# Patient Record
Sex: Female | Born: 1935 | Race: White | Hispanic: No | Smoking: Never smoker
Health system: Southern US, Community
[De-identification: ages and names within clinical notes are randomized; demographics above are authoritative.]

## PROBLEM LIST (undated history)

## (undated) DIAGNOSIS — Z8719 Personal history of other diseases of the digestive system: Secondary | ICD-10-CM

## (undated) DIAGNOSIS — I5033 Acute on chronic diastolic (congestive) heart failure: Secondary | ICD-10-CM

## (undated) DIAGNOSIS — K219 Gastro-esophageal reflux disease without esophagitis: Secondary | ICD-10-CM

## (undated) DIAGNOSIS — J449 Chronic obstructive pulmonary disease, unspecified: Secondary | ICD-10-CM

## (undated) DIAGNOSIS — N2581 Secondary hyperparathyroidism of renal origin: Secondary | ICD-10-CM

## (undated) DIAGNOSIS — I119 Hypertensive heart disease without heart failure: Secondary | ICD-10-CM

## (undated) DIAGNOSIS — I872 Venous insufficiency (chronic) (peripheral): Secondary | ICD-10-CM

## (undated) DIAGNOSIS — I340 Nonrheumatic mitral (valve) insufficiency: Secondary | ICD-10-CM

## (undated) DIAGNOSIS — J189 Pneumonia, unspecified organism: Secondary | ICD-10-CM

## (undated) DIAGNOSIS — I4891 Unspecified atrial fibrillation: Secondary | ICD-10-CM

## (undated) DIAGNOSIS — G43909 Migraine, unspecified, not intractable, without status migrainosus: Secondary | ICD-10-CM

## (undated) DIAGNOSIS — N183 Chronic kidney disease, stage 3 (moderate): Secondary | ICD-10-CM

## (undated) DIAGNOSIS — R0602 Shortness of breath: Secondary | ICD-10-CM

## (undated) DIAGNOSIS — I10 Essential (primary) hypertension: Secondary | ICD-10-CM

## (undated) DIAGNOSIS — I272 Pulmonary hypertension, unspecified: Secondary | ICD-10-CM

## (undated) DIAGNOSIS — I639 Cerebral infarction, unspecified: Secondary | ICD-10-CM

## (undated) DIAGNOSIS — M1990 Unspecified osteoarthritis, unspecified site: Secondary | ICD-10-CM

## (undated) DIAGNOSIS — E785 Hyperlipidemia, unspecified: Secondary | ICD-10-CM

## (undated) DIAGNOSIS — Z86711 Personal history of pulmonary embolism: Secondary | ICD-10-CM

## (undated) DIAGNOSIS — I5032 Chronic diastolic (congestive) heart failure: Secondary | ICD-10-CM

## (undated) HISTORY — PX: PARTIAL COLECTOMY: SHX5273

## (undated) HISTORY — DX: Chronic diastolic (congestive) heart failure: I50.32

## (undated) HISTORY — PX: CATARACT EXTRACTION W/ INTRAOCULAR LENS  IMPLANT, BILATERAL: SHX1307

## (undated) HISTORY — PX: INGUINAL HERNIA REPAIR: SUR1180

---

## 1997-07-26 HISTORY — PX: COLON RESECTION: SHX5231

## 1998-05-20 ENCOUNTER — Inpatient Hospital Stay (HOSPITAL_COMMUNITY): Admission: EM | Admit: 1998-05-20 | Discharge: 1998-05-26 | Payer: Self-pay | Admitting: *Deleted

## 1998-11-25 ENCOUNTER — Ambulatory Visit (HOSPITAL_COMMUNITY): Admission: RE | Admit: 1998-11-25 | Discharge: 1998-11-25 | Payer: Self-pay | Admitting: Family Medicine

## 1998-11-25 ENCOUNTER — Encounter: Payer: Self-pay | Admitting: Family Medicine

## 1999-01-28 ENCOUNTER — Ambulatory Visit (HOSPITAL_COMMUNITY): Admission: RE | Admit: 1999-01-28 | Discharge: 1999-01-28 | Payer: Self-pay | Admitting: Family Medicine

## 1999-01-28 ENCOUNTER — Encounter: Payer: Self-pay | Admitting: Family Medicine

## 1999-03-24 ENCOUNTER — Other Ambulatory Visit: Admission: RE | Admit: 1999-03-24 | Discharge: 1999-03-24 | Payer: Self-pay | Admitting: Family Medicine

## 1999-05-22 ENCOUNTER — Ambulatory Visit (HOSPITAL_COMMUNITY): Admission: RE | Admit: 1999-05-22 | Discharge: 1999-05-22 | Payer: Self-pay | Admitting: *Deleted

## 1999-07-24 ENCOUNTER — Encounter: Payer: Self-pay | Admitting: Emergency Medicine

## 1999-07-24 ENCOUNTER — Emergency Department (HOSPITAL_COMMUNITY): Admission: EM | Admit: 1999-07-24 | Discharge: 1999-07-24 | Payer: Self-pay | Admitting: Emergency Medicine

## 1999-10-06 ENCOUNTER — Ambulatory Visit (HOSPITAL_COMMUNITY): Admission: RE | Admit: 1999-10-06 | Discharge: 1999-10-06 | Payer: Self-pay | Admitting: Family Medicine

## 1999-10-06 ENCOUNTER — Encounter: Payer: Self-pay | Admitting: Family Medicine

## 2000-02-08 ENCOUNTER — Encounter: Admission: RE | Admit: 2000-02-08 | Discharge: 2000-02-25 | Payer: Self-pay | Admitting: Orthopedic Surgery

## 2001-03-23 ENCOUNTER — Encounter: Admission: RE | Admit: 2001-03-23 | Discharge: 2001-03-23 | Payer: Self-pay | Admitting: Specialist

## 2001-03-23 ENCOUNTER — Encounter: Payer: Self-pay | Admitting: Specialist

## 2002-07-26 HISTORY — PX: TOTAL HIP ARTHROPLASTY: SHX124

## 2002-10-16 ENCOUNTER — Encounter: Payer: Self-pay | Admitting: Specialist

## 2002-10-19 ENCOUNTER — Encounter: Payer: Self-pay | Admitting: Specialist

## 2002-10-19 ENCOUNTER — Inpatient Hospital Stay (HOSPITAL_COMMUNITY): Admission: RE | Admit: 2002-10-19 | Discharge: 2002-10-23 | Payer: Self-pay | Admitting: Specialist

## 2002-10-23 ENCOUNTER — Inpatient Hospital Stay (HOSPITAL_COMMUNITY)
Admission: RE | Admit: 2002-10-23 | Discharge: 2002-10-31 | Payer: Self-pay | Admitting: Physical Medicine & Rehabilitation

## 2002-12-02 ENCOUNTER — Encounter: Payer: Self-pay | Admitting: Emergency Medicine

## 2002-12-02 ENCOUNTER — Encounter: Payer: Self-pay | Admitting: Specialist

## 2002-12-02 ENCOUNTER — Inpatient Hospital Stay (HOSPITAL_COMMUNITY): Admission: EM | Admit: 2002-12-02 | Discharge: 2002-12-03 | Payer: Self-pay | Admitting: Emergency Medicine

## 2003-07-27 HISTORY — PX: TOTAL HIP ARTHROPLASTY: SHX124

## 2003-08-30 ENCOUNTER — Inpatient Hospital Stay (HOSPITAL_COMMUNITY): Admission: RE | Admit: 2003-08-30 | Discharge: 2003-09-03 | Payer: Self-pay | Admitting: Orthopedic Surgery

## 2003-09-03 ENCOUNTER — Inpatient Hospital Stay (HOSPITAL_COMMUNITY)
Admission: RE | Admit: 2003-09-03 | Discharge: 2003-09-17 | Payer: Self-pay | Admitting: Physical Medicine & Rehabilitation

## 2004-05-04 ENCOUNTER — Encounter: Admission: RE | Admit: 2004-05-04 | Discharge: 2004-08-02 | Payer: Self-pay | Admitting: Family Medicine

## 2005-06-21 ENCOUNTER — Encounter: Admission: RE | Admit: 2005-06-21 | Discharge: 2005-06-21 | Payer: Self-pay | Admitting: Orthopedic Surgery

## 2005-07-14 ENCOUNTER — Inpatient Hospital Stay (HOSPITAL_COMMUNITY): Admission: EM | Admit: 2005-07-14 | Discharge: 2005-07-20 | Payer: Self-pay | Admitting: Emergency Medicine

## 2005-07-16 ENCOUNTER — Encounter (INDEPENDENT_AMBULATORY_CARE_PROVIDER_SITE_OTHER): Payer: Self-pay | Admitting: *Deleted

## 2007-06-02 ENCOUNTER — Inpatient Hospital Stay (HOSPITAL_COMMUNITY): Admission: EM | Admit: 2007-06-02 | Discharge: 2007-06-05 | Payer: Self-pay | Admitting: Emergency Medicine

## 2007-06-05 ENCOUNTER — Encounter (INDEPENDENT_AMBULATORY_CARE_PROVIDER_SITE_OTHER): Payer: Self-pay | Admitting: Internal Medicine

## 2007-06-09 ENCOUNTER — Emergency Department (HOSPITAL_COMMUNITY): Admission: EM | Admit: 2007-06-09 | Discharge: 2007-06-09 | Payer: Self-pay | Admitting: Emergency Medicine

## 2008-07-19 ENCOUNTER — Ambulatory Visit: Payer: Self-pay | Admitting: Radiology

## 2008-07-19 ENCOUNTER — Encounter: Payer: Self-pay | Admitting: Emergency Medicine

## 2008-07-20 ENCOUNTER — Ambulatory Visit: Payer: Self-pay | Admitting: Internal Medicine

## 2008-07-20 ENCOUNTER — Inpatient Hospital Stay (HOSPITAL_COMMUNITY): Admission: EM | Admit: 2008-07-20 | Discharge: 2008-07-22 | Payer: Self-pay | Admitting: Internal Medicine

## 2008-07-23 ENCOUNTER — Inpatient Hospital Stay (HOSPITAL_COMMUNITY): Admission: EM | Admit: 2008-07-23 | Discharge: 2008-07-25 | Payer: Self-pay | Admitting: Cardiology

## 2008-07-23 ENCOUNTER — Ambulatory Visit: Payer: Self-pay | Admitting: Cardiology

## 2008-07-23 ENCOUNTER — Encounter: Payer: Self-pay | Admitting: Emergency Medicine

## 2008-07-23 ENCOUNTER — Ambulatory Visit: Payer: Self-pay | Admitting: Diagnostic Radiology

## 2008-07-25 ENCOUNTER — Encounter (INDEPENDENT_AMBULATORY_CARE_PROVIDER_SITE_OTHER): Payer: Self-pay | Admitting: Cardiology

## 2008-07-31 ENCOUNTER — Ambulatory Visit: Payer: Self-pay | Admitting: Internal Medicine

## 2008-07-31 DIAGNOSIS — I6350 Cerebral infarction due to unspecified occlusion or stenosis of unspecified cerebral artery: Secondary | ICD-10-CM | POA: Insufficient documentation

## 2008-07-31 DIAGNOSIS — R0989 Other specified symptoms and signs involving the circulatory and respiratory systems: Secondary | ICD-10-CM | POA: Insufficient documentation

## 2008-07-31 DIAGNOSIS — R0609 Other forms of dyspnea: Secondary | ICD-10-CM

## 2008-11-08 ENCOUNTER — Ambulatory Visit: Payer: Self-pay | Admitting: Internal Medicine

## 2009-08-11 ENCOUNTER — Inpatient Hospital Stay (HOSPITAL_COMMUNITY): Admission: AD | Admit: 2009-08-11 | Discharge: 2009-08-14 | Payer: Self-pay | Admitting: Cardiology

## 2009-08-12 ENCOUNTER — Encounter (INDEPENDENT_AMBULATORY_CARE_PROVIDER_SITE_OTHER): Payer: Self-pay | Admitting: Cardiology

## 2009-08-20 ENCOUNTER — Telehealth: Payer: Self-pay | Admitting: Internal Medicine

## 2010-08-25 NOTE — Progress Notes (Signed)
Summary: returning call  Phone Note Call from Patient Call back at Home Phone [phone]   Caller: Patient Call For: wert Reason for Call: Talk to Doctor Summary of Call: patient is returning a call. she thinks she needs an apt but she don't know why she would need to see dr wert since he released her. Initial call taken by: Valinda Hoar,  August 20, 2009 12:44 PM  Follow-up for Phone Call        pt was called to advise that she is supposed to get her refills from her PCP. Pt states she will call PCP for refill. Carron Curie CMA  August 20, 2009 2:11 PM

## 2010-10-11 LAB — DIFFERENTIAL
Basophils Absolute: 0 10*3/uL (ref 0.0–0.1)
Basophils Relative: 0 % (ref 0–1)
Eosinophils Absolute: 0.4 10*3/uL (ref 0.0–0.7)
Eosinophils Relative: 5 % (ref 0–5)
Lymphocytes Relative: 29 % (ref 12–46)
Lymphs Abs: 2.1 10*3/uL (ref 0.7–4.0)
Monocytes Absolute: 0.7 10*3/uL (ref 0.1–1.0)
Monocytes Relative: 10 % (ref 3–12)
Neutro Abs: 4.1 10*3/uL (ref 1.7–7.7)
Neutrophils Relative %: 56 % (ref 43–77)

## 2010-10-11 LAB — COMPREHENSIVE METABOLIC PANEL
ALT: 17 U/L (ref 0–35)
AST: 22 U/L (ref 0–37)
Albumin: 3.8 g/dL (ref 3.5–5.2)
Alkaline Phosphatase: 65 U/L (ref 39–117)
BUN: 43 mg/dL — ABNORMAL HIGH (ref 6–23)
CO2: 30 mEq/L (ref 19–32)
Calcium: 9.3 mg/dL (ref 8.4–10.5)
Chloride: 99 mEq/L (ref 96–112)
Creatinine, Ser: 1.61 mg/dL — ABNORMAL HIGH (ref 0.4–1.2)
GFR calc Af Amer: 37 mL/min — ABNORMAL LOW (ref >60)
GFR calc non Af Amer: 30 mL/min — ABNORMAL LOW (ref >60)
Glucose, Bld: 155 mg/dL — ABNORMAL HIGH (ref 70–99)
Potassium: 4.2 mEq/L (ref 3.5–5.1)
Sodium: 141 mEq/L (ref 135–145)
Total Bilirubin: 0.5 mg/dL (ref 0.3–1.2)
Total Protein: 6.9 g/dL (ref 6.0–8.3)

## 2010-10-11 LAB — CBC
HCT: 37.1 % (ref 36.0–46.0)
Hemoglobin: 12.7 g/dL (ref 12.0–15.0)
MCHC: 34.1 g/dL (ref 30.0–36.0)
MCV: 93.8 fL (ref 78.0–100.0)
Platelets: 242 10*3/uL (ref 150–400)
RBC: 3.96 MIL/uL (ref 3.87–5.11)
RDW: 13.5 % (ref 11.5–15.5)
WBC: 7.3 10*3/uL (ref 4.0–10.5)

## 2010-10-11 LAB — PROTIME-INR
INR: 1.86 — ABNORMAL HIGH (ref 0.00–1.49)
INR: 2.07 — ABNORMAL HIGH (ref 0.00–1.49)
INR: 2.1 — ABNORMAL HIGH (ref 0.00–1.49)
Prothrombin Time: 21.3 seconds — ABNORMAL HIGH (ref 11.6–15.2)
Prothrombin Time: 23.4 seconds — ABNORMAL HIGH (ref 11.6–15.2)
Prothrombin Time: 26 seconds — ABNORMAL HIGH (ref 11.6–15.2)

## 2010-10-11 LAB — APTT: aPTT: 33 seconds (ref 24–37)

## 2010-10-11 LAB — CARDIAC PANEL(CRET KIN+CKTOT+MB+TROPI)
CK, MB: 1.7 ng/mL (ref 0.3–4.0)
Relative Index: INVALID (ref 0.0–2.5)
Total CK: 70 U/L (ref 7–177)
Troponin I: 0.03 ng/mL (ref 0.00–0.06)

## 2010-10-11 LAB — TSH: TSH: 1.273 u[IU]/mL (ref 0.350–4.500)

## 2010-12-08 NOTE — H&P (Signed)
[name], [name] NO.:  [id number]   MEDICAL RECORD NO.:  [id number]          PATIENT TYPE:  INP   LOCATION:  1427                         FACILITY:  [facility]   PHYSICIAN:  Gardiner Barefoot, MD    DATE OF BIRTH:  1922/04/18   DATE OF ADMISSION:  07/20/2008  DATE OF DISCHARGE:                              HISTORY & PHYSICAL   PRIMARY CARE PHYSICIAN:  Carilyn Goodpasture, PA, of [facility].   HISTORY OF PRESENT ILLNESS:  An 77 year old female with history of  hypertension and history of a PE, who presented with chest pain for a  day, pleuritic.  The chest pain resolved on its own; however, the  patient during her ER evaluation felt some angina-type symptoms.  No  recent illnesses or fever.   PAST MEDICAL HISTORY:  1. Hypertension.  2. History of PE.  3. History of atrial fibrillation.   MEDICATIONS:  Include:  1. Aspirin 81 mg daily.  2. Doxazosin 8 mg daily.  3. Evista 60 mg daily.  4. Avapro 300 mg daily.  5. Bumetadine 2 mg daily.  6. Omeprazole 20 mg daily.   ALLERGIES:  1. CLONIDINE.  2. LISINOPRIL.   FAMILY HISTORY:  Noncontributory.   SOCIAL HISTORY:  Denies any alcohol, tobacco, or drugs.   REVIEW OF SYSTEMS:  Negative except as per the History of Present  Illness.   PHYSICAL EXAMINATION:  VITAL SIGNS:  Temperature 99.0, pulse 89,  respirations 20, blood pressure is 184/100, and 02 SAT is 95% on 2 L.  GENERAL:  The patient is awake, alert, and oriented x3, appears in no  acute distress.  CARDIOVASCULAR:  Irregular rate and rhythm with a systolic ejection  murmur.  LUNGS:  Clear to auscultation bilaterally.  ABDOMEN:  Soft, nontender, and nondistended, positive bowel sounds, no  hepatosplenomegaly.  EXTREMITIES:  No edema.   LABORATORY DATA:  Includes a CT angio negative for PE.  CK-MB is 1.5,  troponin less than 0.05.  Sodium 144, potassium 3.9, chloride 101,  bicarb 34, glucose 231, BUN 33, creatinine is 1.3.  WBC is 11.6,  hemoglobin of 12.9, platelets 190.   ASSESSMENT AND PLAN:  1. Chest pain.  Will rule out for myocardial infarction.  2. Hypertension.  Will continue with home medications.  3. Gastroesophageal reflux disease.  Will continue with home      medications.  4. Prophylaxis.  Will provide deep venous thrombosis prophylaxis.      Gardiner Barefoot, MD  Electronically Signed     RWC/MEDQ  D:  07/20/2008  T:  07/20/2008  Job:  [id number]   cc:   Carilyn Goodpasture, PA  [facility]

## 2010-12-08 NOTE — Discharge Summary (Signed)
NAMECHRISTAIN, [name]          ACCOUNT NO.:  [id number]   MEDICAL RECORD NO.:  [id number]          PATIENT TYPE:  INP   LOCATION:  4729                         FACILITY:  [facility]   PHYSICIAN:  [name], M.D.DATE OF BIRTH:  September 17, 1921   DATE OF ADMISSION:  07/23/2008  DATE OF DISCHARGE:  07/25/2008                               DISCHARGE SUMMARY   FINAL DIAGNOSES:  1. Paroxysmal atrial fibrillation, resolved.  2. Hypertensive heart disease with diastolic dysfunction.  3. Esophageal reflux.  4. Diet-controlled diabetes.   PROCEDURES:  Echocardiogram.   HISTORY:  The patient is an 77 year old female with a history of  paroxysmal atrial fibrillation, hypertension, and diastolic heart  failure.  She had recently been admitted to [facility] on  July 20, 2008, with pleuritic right-sided chest pain.  She was  discharged the day prior to admission and was readmitted that day.  She  had a negative CT scan of her chest.  The evening prior to admission,  she felt severe substernal pain, not like the pleuritic chest pain,  rapid heartbeat, and was found to be in rapid atrial fibrillation.  She  presented to the emergency room and was placed on a diltiazem drip and  went back into sinus rhythm following that.  Please see the previously  dictated history and physical for remainder of the details.   HOSPITAL COURSE:  The patient converted to sinus rhythm on diltiazem.  She was placed initially on heparin and cardiac enzymes were negative.  She was transitioned to oral diltiazem and was begun on warfarin.  An  echocardiogram showed concentric LVH with normal systolic function.  There was evidence of moderate-to-severe pulmonary hypertension also  noted.  There was mild mitral regurgitation and mild aortic  regurgitation.  After reviewing her clinical course, it was felt that  she should be maintained on long-term Coumadin anticoagulation  especially with paroxysmal  atrial fibrillation as well as a previous  pulmonary embolus.  It was apparent that there had been some adjustments  in her medicines recently.  She had previously been on atenolol and had  been changed of Bystolic.  She was also due to see Dr. Cliffton Asters soon.  She  was stable and I felt she could be discharged and she was given Coumadin  anticoagulation and instruction.   DISCHARGE MEDICATIONS:  1. Warfarin 5 mg daily.  2. Doxazosin 4 mg daily.  3. Avapro 300 mg daily.  4. Bumetanide 2 mg daily.  5. Omeprazole 20 mg daily.  6. Sustained-release diltiazem 180 mg daily.   At the present time, she is to be off beta-blockers and is to be off  Evista as well as Naprosyn.  She may continue 81 mg of aspirin daily.  I  will see her back in followup on Monday for protime and she is to report  if there is recurrent symptoms or problems.      [name], M.D.  Electronically Signed     WST/MEDQ  D:  07/25/2008  T:  07/26/2008  Job:  [id number]   cc:   Stacie Acres. Cliffton Asters, M.D.

## 2010-12-08 NOTE — Discharge Summary (Signed)
[name], [name]          ACCOUNT NO.:  [id number]   MEDICAL RECORD NO.:  [id number]          PATIENT TYPE:  INP   LOCATION:  6730                         FACILITY:  [facility]   PHYSICIAN:  Kela Millin, M.D.DATE OF BIRTH:  March 12, 1922   DATE OF ADMISSION:  06/02/2007  DATE OF DISCHARGE:  06/05/2007                               DISCHARGE SUMMARY   DISCHARGE DIAGNOSES:  1. Hypertensive crisis/malignant hypertension.  2. Pulmonary edema, question pulmonary edema secondary to #1.  3. History of paroxysmal atrial fibrillation.  4. Diabetes mellitus, diet-controlled.  5. History of pulmonary embolism, off Coumadin for greater than 1 year      and CT scan of chest on June 02, 2007, negative.  6. History of pelvic fracture.   PROCEDURES AND STUDIES:  1. Negative for pulmonary embolism.  2. Slight progression of diffuse mediastinal and hilar adenopathy.  3. Diffuse mild interstitial edema pattern throughout the lungs with      small effusions suspicious for early congestive heart failure.  4. Two-dimensional echocardiogram done on June 05, 2007, pending      at the time of this dictation, the patient to follow up with Dr.      Donnie Aho and Dr. Cliffton Asters for results of the echocardiogram, ruled out      for myocardial infarction by enzymes.   HISTORY OF PRESENT ILLNESS:  The patient is an 77 year old, white female  with the above-listed medical problems who presented with complaints of  shortness of breath.  It was noted that she had a previous history of  pulmonary embolism and had been on Coumadin which had been discontinued  following treatment over a year ago.  She stated that her shortness of  breath had worsened over 2 weeks and admitted to mild increase in lower  extremity edema and PND.  She denied chest pain, orthopnea and also  denied fevers.  She admitted to a cough that was occasionally productive  of clear sputum.   In the ER, she had a CT angiogram of her chest  with the results as  above, negative for pulmonary embolism.  A B-type natriuretic peptide  was also done and this was elevated at 927.  It was noted that her blood  pressure initially was 209/93 in the ER.  She was admitted for further  evaluation and management.   Please see the full admission history and physical of June 02, 2007,  for the details of the admission physical exam and laboratory data.   HOSPITAL COURSE:  Problem 1.  HYPERTENSIVE CRISIS/MALIGNANT  HYPERTENSION:  In the ER, the patient received 20 mg of labetalol bolus.  Her blood pressures were still elevated and so she was started on  labetalol drip and admitted to the step-down unit.  Serial cardiac  enzymes were done and they were all negative for myocardial infarction.  In talking with the patient, she admitted to taking two Aleve daily for  arthritis.  I have instructed her not to take any further Aleve and to  take Tylenol p.r.n. or the Darvocet that she already has prescribed.  Her blood pressures have improved after getting  her restarted on her  outpatient medications and adding 1 mg p.o. dose of Bumex to what she  was taking already.  The patient's blood pressure in the past 48 hours  has ranged from 110-149/59-66.  She is asymptomatic.  She has been  ambulating and she will be discharged home at this time to follow up  with her primary care physician.   Problem 2.  PULMONARY EDEMA:  The patient was diuresed with IV Lasix  initially and her Bumex resumed.  As noted above, her B-type natriuretic  peptide was initially elevated and improved with diuresis to 546 today  at the time of discharge.  The patient's Bumex dose was changed from 2  mg daily to 2 mg in the morning and 1 mg in the p.m..  As already  discussed above, her Aleve has been discontinued.  A 2-D echocardiogram  was done today, June 05, 2007, and the results are pending at the  time of this dictation.  Her serial cardiac enzymes have been  negative  and she is asymptomatic.  She is to follow up with her primary care  physician and with Dr. Donnie Aho for the results of the 2-D echocardiogram.   Problem 3.  HISTORY OF PAROXYSMAL ATRIAL FIBRILLATION:  Her rate  remained controlled on the beta-blocker.  She is to continue this upon  discharge.   Problem 4.  DIABETES MELLITUS, DIET CONTROLLED:  She was maintained on a  diabetic diet during her hospital stay.  Her hemoglobin A1c was checked  and this was within normal limits at 5.6.   DISCHARGE MEDICATIONS:  1. Bumex 2 mg in a.m. and 1 mg in p.m.  2. The patient is to continue her Cardura, Avapro, lisinopril,      atenolol, Darvocet and Tylenol p.r.n.  3. Discontinue Aleve.   FOLLOWUP:  1. Dr. Laurann Montana.  The patient to call for appointment upon      discharge.  2. Dr. Donnie Aho in 1-2 weeks.  The patient to call for appointment.   DISCHARGE CONDITION:  Improved/stable.      Kela Millin, M.D.  Electronically Signed     ACV/MEDQ  D:  06/05/2007  T:  06/06/2007  Job:  [id number]   cc:   Stacie Acres. Cliffton Asters, M.D.  [name], M.D.

## 2010-12-08 NOTE — H&P (Signed)
NAMEMARVELYN, BOUCHILLON NO.:  [id number]   MEDICAL RECORD NO.:  [id number]          PATIENT TYPE:  INP   LOCATION:                               FACILITY:  [facility]   PHYSICIAN:  Marca Ancona, MD      DATE OF BIRTH:  1921/08/27   DATE OF ADMISSION:  07/23/2008  DATE OF DISCHARGE:                              HISTORY & PHYSICAL   PRIMARY CARDIOLOGIST:  [name], MD   PRIMARY CARE PHYSICIAN:  Dr. Carilyn Goodpasture with [facility].   HISTORY OF PRESENT ILLNESS:  This is an 77 year old with a history of  paroxysmal atrial fibrillation, hypertension, and diastolic congestive  heart failure who was admitted this evening with atrial fibrillation  with rapid ventricular response.  The patient was initially admitted to  [facility] on July 20, 2008, with pleuritic right-sided chest pain.  She was discharged on July 22, 2008.  CTA of the chest was negative  for PE and the patient ruled out for MI.  No discharge summary was  available, but the patient states she was told she had musculoskeletal  chest pain and was sent home.  Last night at 10:15 p.m., the patient  felt severe substernal chest pressure that was not like the prior  pleuritic chest pain.  She took aspirin 325 mg.  The pain resolved in  approximately half an hour.  At the same time as the pain started, she  also thought her heart rate started to race and to go irregularly.  She  came to the emergency department and was found to be in atrial  fibrillation with rapid ventricular response with a rate in the 140s.  She was begun on diltiazem drip.  This was titrated up to 15 mg an hour  with control of her heart rate down to the 90s to 100s.  Her rhythm then  actually converted to normal sinus rhythm in the 60s early this morning.  The patient additionally reports a productive cough for 3 weeks.  She  was told a couple of weeks ago that this was bronchitis.  She has had no  fever.  She does have  baseline dyspnea on exertion after walking for  approximately 1 block.  She did feel at her baseline yesterday afternoon  when she was discharged from [facility].   PAST MEDICAL HISTORY:  1. Hypertension.  2. History of pulmonary embolism in 2007 after a pelvic fracture.  The      patient was on Coumadin for over a year.  3. Gastroesophageal reflux disease.  4. Paroxysmal atrial fibrillation.  5. Diet-controlled diabetes.  6. Diastolic congestive heart failure.  The patient did have an      echocardiogram in November 2008 showing an EF of 60-65% with mild-      to-moderate LVH.  There was mitral valve prolapse with moderate      mitral regurgitation.  There was moderate-to-severe tricuspid      regurgitation with pulmonary artery systolic pressure of 60-70      mmHg.   SOCIAL HISTORY:  The patient is nonsmoker.  She lives in [place]  with  her daughter.   FAMILY HISTORY:  Noncontributory.   MEDICATIONS:  1. Aspirin 81 mg daily.  2. Doxazosin 8 mg daily.  3. Irbesartan 300 mg daily.  4. Bumetanide 2 mg daily.  5. Omeprazole 20 mg daily.   LABORATORY DATA:  Today, sodium 142, potassium 4.0, BUN 22, creatinine  1.2, and glucose 141.  White count 7, hematocrit 36, and platelets 161.  Initial CK-MB 1.9, initial troponin less than 0.05.  Chest x-ray was  reviewed, it shows bibasilar airspace disease, right greater than left.  EKG was reviewed, shows atrial fibrillation with a rapid ventricular  response in the 140s.   PHYSICAL EXAMINATION:  VITAL SIGNS:  Blood pressure is 142/83; heart  rate was initially in the 140s, is now down to the 60s in sinus rhythm;  temperature is 97.6; and O2 saturation 97% on 2 L of nasal cannula.  GENERAL:  This is a mildly obese female in no apparent distress.  NEUROLOGIC:  Alert and oriented x3.  Normal affect.  LUNGS:  Rare crackles and decreased breath sounds at the bases  bilaterally, worse at the right base.  CARDIOVASCULAR:  Heart regular.   S1 and S2.  There is a 1/6 whole  systolic murmur at left lower sternal border.  There is no S3 or S4.  There is 1+ ankle edema.  There is no carotid bruits.  There are 2+  posterior tibial pulses bilaterally.  NECK:  The external jugular veins are significantly dilated.  It is  quite hard to see her internal jugular pulsations.  There is no  thyromegaly or thyroid nodule.  HEENT:  Normal exam.  ABDOMEN:  Soft, nontender.  Normal bowel sounds.  No hepatosplenomegaly.  MUSCULOSKELETAL:  Normal.  SKIN:  Normal.  EXTREMITIES:  No clubbing or cyanosis.   ASSESSMENT/PLAN:  This is an 77 year old with history of paroxysmal  atrial fibrillation, hypertension, diastolic congestive heart failure  who presents with an episode of chest pain in the setting of the  development of atrial fibrillation with a rapid ventricular response.  1. Atrial fibrillation.  The patient converted to normal sinus rhythm      this a.m. on diltiazem.  She has a history of paroxysmal atrial      fibrillation.  She is currently on a diltiazem drip.  She just      converted over to sinus rhythm.  If she remains in sinus rhythm, we      can covert her over to p.o. diltiazem.  We will also have her on a      heparin drip for now, given that her atrial fibrillation with      conversion to sinus rhythm.  We will start Warfarin.  We will also      check a TSH.  2. Chest pain.  This may be the main ischemia related to atrial      fibrillation with rapid ventricular response.  Her first set of      cardiac enzymes are negative.  We will cycle her cardiac enzymes.      We will check fasting lipids.  We will continue her on aspirin 81      mg daily.  If her cardiac enzymes continue to be negative, we will      consider a stress test, this could be done possibly as an      outpatient.  3. Cough and airspace disease on chest x-ray.  I would consider  possible pneumonia in this patient.  She is afebrile and her white       count is now elevated; however, given her productive cough and her      chest x-ray infiltrates, I will treat her with moxifloxacin for      now.  4. Congestive heart failure.  The patient does have a history of      diastolic congestive heart failure.  She does appear mildly volume      overloaded.  We will continue her on her bumetanide and we will      check a BNP.      Marca Ancona, MD  Electronically Signed    DM/MEDQ  D:  07/23/2008  T:  07/23/2008  Job:  [id number]   cc:   [name], M.D.

## 2010-12-08 NOTE — Discharge Summary (Signed)
[name], [name]          ACCOUNT NO.:  [id number]   MEDICAL RECORD NO.:  [id number]          PATIENT TYPE:  INP   LOCATION:  2919                         FACILITY:  [facility]   PHYSICIAN:  Kela Millin, M.D.DATE OF BIRTH:  12/15/1921   DATE OF ADMISSION:  06/02/2007  DATE OF DISCHARGE:                               DISCHARGE SUMMARY   PRIMARY CARE PHYSICIAN:  Dr. Laurann Montana.   CARDIOLOGIST:  [name], M.D.   CHIEF COMPLAINT:  Shortness of breath.   HISTORY OF PRESENT ILLNESS:  The patient is a pleasant 77 year old white  female with past medical history significant for pulmonary embolism  treated with Coumadin for a year and was discontinued a little over the  year ago per family, paroxysmal atrial fibrillation per E-chart,  diabetes mellitus that is diet controlled, hypertension who presents  with the above complaints.  She states that she has had shortness of  breath over the past two weeks and it has worsened over the past few  days.  She admits to a mild increase in the leg swelling and PND.  She  denies chest pain, also denies orthopnea and no fevers.  She states that  she has a cough that is occasionally productive of clear sputum.  She  denies nausea and vomiting, hemoptysis, diarrhea, melena and no  hematochezia.   The patient was seen in the emergency room and a chest x-ray showed no  acute cardiopulmonary findings, mild cardiomegaly was noted, a D-dimer  was noted to be elevated at 2.  A CT angiogram of the chest was done  which was negative for pulmonary embolus, it revealed cardiomegaly with  mild interstitial edema and small effusions, also increased mild  mediastinal/hilar adenopathy.  The patient's blood pressure on arrival  in the ER was noted to be 209/93 and on recheck it was 210/80, the  patient was given 20 of IV labetalol and is admitted for further  evaluation and management.   PAST MEDICAL HISTORY:  1. As above.  2. History of  osteoporosis.  3. History of pelvic fracture.   PAST SURGICAL HISTORY:  History of hip replacement in the past.   MEDICATIONS:  1. Bumex 2 mg daily.  2. Cardura 8 mg daily.  3. Avapro 300 mg daily.  4. Lisinopril 20 mg daily.  5. Atenolol 100 mg daily.  6. Darvocet p.r.n.  7. Aleve p.r.n.  8. Evista 60 mg.  9. Allegra 60 mg p.r.n.  10.ProAir inhaler.   ALLERGIES AND INTOLERANCES:  CLONIDINE causes pruritus.   SOCIAL HISTORY:  She denies tobacco, also denies alcohol.   FAMILY HISTORY:  Her brother had a PE and DVT.  She had another brother  who had a stroke.   REVIEW OF SYSTEMS:  As per HPI, other review of systems negative.   PHYSICAL EXAMINATION:  GENERAL APPEARANCE:  The patient is a pleasant,  elderly white female in no apparent distress with nasal cannula oxygen,  O2 sats initially per ER 88% also off oxygen and improved to 98% on  oxygen.  HEENT:  PERRLA, EOMI, sclerae anicteric, moist mucous membranes and no  oral exudates.  NECK:  Supple, no JVD.  No thyromegaly.  LUNGS:  Few basilar crackles, no wheezes.  CARDIOVASCULAR:  Regular rate and rhythm.  Normal S1-S2, no S3  appreciated.  ABDOMEN:  Soft, bowel sounds present, nontender, nondistended.  No  organomegaly and no masses palpable.  EXTREMITIES:  Trace edema.  No cyanosis.  NEUROLOGIC:  She is alert and oriented x3.  Cranial nerves II-XII  grossly intact.  Nonfocal exam.   LABORATORY DATA:  CT scan and chest x-ray as per HPI.   Her white cell count is 7.4 with a hemoglobin of 12.1, hematocrit 36.3,  platelet count of 202, neutrophil 58%.  Sodium is 141 with a potassium  of 4, chloride 108, BUN 25, creatinine is 1.2, glucose is 87.  A pH of  7.47 with a pCO2 of 39.9.  Point of care markers negative x1.  Her INR  is 1.   ASSESSMENT AND PLAN:  1. Malignant hypertension - as discussed above, will place on      labetalol drip, follow resume p.o. medications and adjust as      appropriate.  Noted that patient  takes Aleve p.r.n., will      discontinue.  Will obtain serial cardiac enzymes, follow.  2. Pulmonary edema - likely secondary to above, will obtain a brain      natriuretic peptide, follow serial cardiac enzymes.  Diurese with      IV Lasix, follow and continue Bumex.  3. History of paroxysmal atrial fibrillation - follow and continue      beta blockers.  4. Diabetes - per patient diet controlled, will place on diabetic      diet, also obtain a hemoglobin A1c and follow.  5. History of pulmonary embolus - as above.  The patient has been off      Coumadin and CT scan is still negative at this time for a pulmonary      embolism.      Kela Millin, M.D.  Electronically Signed     ACV/MEDQ  D:  06/03/2007  T:  06/04/2007  Job:  [id number]   cc:   Stacie [place]. Cliffton Asters, M.D.  [name], M.D.

## 2010-12-11 NOTE — Discharge Summary (Signed)
NAMEBARBEE, [name]                    ACCOUNT NO.:  [id number]   MEDICAL RECORD NO.:  [id number]                   PATIENT TYPE:  IPS   LOCATION:  4038                                 FACILITY:  [facility]   PHYSICIAN:  Erick Colace, M.D.           DATE OF BIRTH:  September 01, 1921   DATE OF ADMISSION:  09/03/2003  DATE OF DISCHARGE:  09/17/2003                                 DISCHARGE SUMMARY   DISCHARGE DIAGNOSES:  1. Right total hip replacement secondary to end-stage osteoarthritis.  2. Hypertension.  3. Postoperative anemia.  4. Urinary retention, resolved.   HISTORY OF PRESENT ILLNESS:  The patient is a 77 year old female with a  history of hypertension, DJD, status post left total hip replacement and now  with right hip pain secondary to end-stage OA. She was also noted to have  leg length discrepancy with right being shorter than the left.  The patient  elected to undergo right total hip replacement on February 4, by Dr. Charlann Boxer.  Postoperative is partial weightbearing and on Coumadin for DVT prophylaxis,  INR at 2.2.  Once Foley discontinued, the patient was noted to have problems  with urinary retention and Foley was replaced.  Therapy was initiated and  the patient had +2 total assist 60% for transfers, +2 total assist 60% for  ambulating 10 feet with a rolling walker, requires minimal assist for upper  body care, max assist for low body care.   PAST MEDICAL HISTORY:  Significant for dyslipidemia, hypertension, GERD,  mini strokes, and renal calculi.  History of traumatic colon rupture, status  post resection, hernia repair, and right hip replacement.   ALLERGIES:  CATAPRES.   SOCIAL HISTORY:  The patient is widowed and lives in one-level home with two  steps at entry, was independent prior to admission.  She does not use any  tobacco or alcohol.  Daughter can provide some assistance past discharge.   HOSPITAL COURSE:  The patient was admitted to rehab on August 28, 2003, for  inpatient therapy to consist of PT and OT daily.  Past admission, the  patient was maintained on Coumadin for DVT prophylaxis.  Blood pressures  were monitored on b.i.d. basis and have shown reasonable control.  Foley was  discontinued past admission and initially the patient was noted to have  problems with voiding.  A UA UCS was sent off and the patient's urine grew  out greater than 100,000 colonies of Escherichia coli.  She was treated with  7-day course of Macrodantin for this.  The patient's postoperative anemia  was followed along and last check of CBC shows H&H to be stable at 9.5 and  28.1.  Check of lytes showed sodium of 139, potassium 4.1, chloride 102, CO2  30, BUN 21, creatinine 1.2, glucose 108.  A mild elevation of LFT's noted  with AST of 39, alkaline phosphatase at 131.   The patient's hip incision has healed well without any signs  or symptoms of  infection. No drainage and no erythema noted.  She was noted to have  moderate edema at the hip. Initially, the patient was noted to have a lot of  problems with pain control and she was started on OxyContin with Ultram  added to help with pain control and better participation and therapy.  However, the patient was noted to have some problems with disorientation and  confusion on this.  Therefore, this was discontinued.  She attempted to get  up and was found on the floor in the room and follow-up hip x-rays done on  December 16 showed right total hip prosthesis to be in satisfactory  position.  The patient has been able to tolerate plate Oxycodone schedule on  b.i.d. basis with scheduled Tylenol as well as scheduled muscle relaxers and  this has provided reasonable pain control.  During her stay in rehab, the  patient progressed to being at supervision for transfers, she is modified  independent for ambulating 150 feet with a rolling walker and is min assist  for navigating stairs.  She is modified independent for  ADL's at seated  position, requires close supervision for toilet transfers.  She is modified  independent for toilet hygiene.  Further follow-up therapies to include home  health PT and OT by [facility].  The patient also has home  health RN arranged to drop pro-time next on February 24, with Coumadin to  continue through March 4.  On September 17, 2003, the patient is discharged  to home.   DISCHARGE MEDICATIONS:  1. Tylenol 650 mg q.i.d.  2. Norvasc 2.5 mg a day.  3. Enteric-coated aspirin 81 mg a day.  4. Tenormin hydrochlorothiazide 100/25 one per day.  5. Robaxin 750 mg q.i.d.  6. Avapro 300 mg per day.  7. Trinsicon one p.o. b.i.d.  8. Coumadin 4 mg q.p.m.  9. Oxycodone IR 5 to 10 mg p.o. q.4-6h p.r.n. pain.   ACTIVITY:  Use a walker.  Partially weightbearing right lower extremity.  Follow right total hip precautions.   DIET:  Regular.   WOUND CARE:  Keep area clean and dry.   DISCHARGE INSTRUCTIONS:  No alcohol, no smoking, and no driving.  [facility] to provide PT and RN with next pro-time drawn on February 24,  continue to taper Robaxin over the next few weeks.  Use two Oxycodone IR  q.a.m. and at lunchtime to help with pain control and taper Oxycodone in the  next few weeks.   FOLLOW UP:  The patient is to follow up with Dr. Charlann Boxer for an appointment in  two to three weeks. Follow up with Dr. Wynn Banker as needed.  Follow up with  Dr. Cliffton Asters for routine check.      Greg Cutter, P.A.                    Erick Colace, M.D.    PP/MEDQ  D:  09/17/2003  T:  09/18/2003  Job:  [id number]   cc:   Stacie Acres. White, M.D.  [address]  [place]  [place] [address]  Fax: [phone]   Madlyn Frankel. Charlann Boxer, M.D.  [facility]  [address]  [address]  [place]  [place] [address]  Fax: [phone]

## 2010-12-11 NOTE — Op Note (Signed)
NAME:  [name], [name]                    ACCOUNT NO.:  [id number]   MEDICAL RECORD NO.:  [id number]                   PATIENT TYPE:  INP   LOCATION:  0002                                 FACILITY:  [facility]   PHYSICIAN:  Ronnell Guadalajara, M.D.                DATE OF BIRTH:  January 25, 1922   DATE OF PROCEDURE:  10/19/2002  DATE OF DISCHARGE:                                 OPERATIVE REPORT   SURGEON:  Ronnell Guadalajara, M.D.   ASSISTANT:  Georges Lynch. Darrelyn Hillock, M.D.   PREOPERATIVE DIAGNOSIS:  Degenerative arthritis of the left hip.   POSTOPERATIVE DIAGNOSIS:  Degenerative arthritis of the left hip.   OPERATIVE PROCEDURE:  Left total hip replacement arthroplasty using an  Osteonics fully uncemented system with a size 52 microstructured Omnifit DSL  acetabular shell with a 10 degree insert series 2 with two cancellous bone  screws, 40 and 35 mm, respectively with a primary Secure-Fit plus hip.  That  is the one with the clothespin stem, size 8, with a 14 tip and a plus 10 C  tapered head.   DESCRIPTION OF PROCEDURE:  After suitable general anesthesia, the patient is  positioned in the right lateral decubitus and the left hip is prepped and  draped routinely. A basic Austin-Moore approach is utilized and the external  rotators are tagged and used to protect the sciatic nerve. Posterior  capsulectomy is followed by dislocation of the hip, amputation of the head  and shaping of the femur with appropriate reamers until it was felt that a  size 8 stem was excellent and we reamed distally to a 14.5 to accept the 14  distal tip. The capsulectomy was then carried out and then reaming was then  done up to a 52 with a good trial reduction checking with the trial cup  position and the Charnley it looked like he was at about 25 degrees of  anteversion and 45 degrees adduction. The real cup was inserted and a second  trial again confirming everything to be well covered and neutral. It was  felt that a  plus 10 head gave a little longer leaver arm and little less  likely to impinge in adduction with a 32 mm head. The two screws were then  put into the acetabulum after drilling and the final cup inserted. The femur  was then inserted and trial reduction carried out, plus 10 seemed ideal. The  femur actually seated a couple of millimeters than anticipated with a good  solid end point so that on this particular stem where there was normally a  small slot between the coating and the smooth surface, the slot actually  went nicely into the end of the femur. Good stable hip in all parameters  there seemed to be. The wound was closed over a Hemovac repairing one of the  external rotators back with bone. The piriformis was too short to repair.  The femoral attachment  of the gluteus maximus was not cut through during the  procedure. A small incision was utilized. #1 PDS to repair the fascia lata  and the traction of the gluteus maximus and coated Vicryl in the subcu and  running 3-0 coated Vicryl with Steri-Strips on the skin. A nice compression  dressing, goes to an abduction pillow and goes to recovery in good  condition.                                               Ronnell Guadalajara, M.D.    PC/MEDQ  D:  10/19/2002  T:  10/19/2002  Job:  [id number]

## 2010-12-11 NOTE — Consult Note (Signed)
[name], [name] NO.:  [id number]   MEDICAL RECORD NO.:  [id number]          PATIENT TYPE:  INP   LOCATION:  3707                         FACILITY:  [facility]   PHYSICIAN:  [name], M.D.DATE OF BIRTH:  11/09/1921   DATE OF CONSULTATION:  DATE OF DISCHARGE:                                   CONSULTATION   CONSULTING PHYSICIAN:  [name], M.D.   REASON FOR CONSULTATION:  Paroxysmal atrial fibrillation.   HISTORY:  The patient is an 77 year old female with a history of known  hypertension and diet-controlled diabetes.  She had a pelvic fracture that  probably occurred when she was in the shower on May 31, 2005.  She  developed chest discomfort and dizziness and was brought to the emergency  room where she was found to be somewhat hypotensive and was found to have an  acute pulmonary embolus on CT scan with an elevated D-dimmer.  She has been  admitted to the hospital and has undergone treatment with Lovenox and has  been loaded on Coumadin.  She has been continued on Atenolol.  She has had  some paroxysmal atrial fibrillation flutter with controlled response  occurring intermittently since then.  However, she is in sinus rhythm at the  time I am seeing her.  She is not symptomatic of this.  She does not  normally have any angina and denies PND, orthopnea, edema, syncope, or  palpitations normally.  She did have a episode where she felt as if her neck  was somewhat prominent that happened recently.   PAST HISTORY:  1.  Hypertension.  2.  Diet-controlled diabetes.  3.  Recent diagnosis of a pelvic fracture.   PREVIOUS SURGERY:  1.  She has had a previous hip replacement in the past.  2.  She has had colon rupture after colonoscopy and had surgery from that      previously.   ALLERGIES:  CLONIDINE.   MEDICATIONS:  See the orders.   SOCIAL HISTORY:  She lives in an apartment near her daughter.  She does not  smoke or use alcohol to  excess and is currently a widow.   FAMILY HISTORY:  She has a history of DVT and pulmonary embolus in the  family.   REVIEW OF SYSTEMS:  Her weight has been mildly obese.  Her vision and  hearing are good.  She has no difficulty swallowing, no history of GI  bleeding.  She has had a previous history of edema, treated with diuretics  previously.  Other than as noted above, review of systems unremarkable.   PHYSICAL EXAMINATION:  GENERAL:  She is a pleasant elderly female in no  acute distress.  VITAL SIGNS:  Blood pressure is 150/70, pulse is currently 80 and regular.  SKIN:  Warm and dry.  ENT:  EOMI.  PERRLA.  C&S clear.  Fundi not examined.  Pharynx negative.  NECK:  Supple without masses, JVD, thyromegaly, or bruits.  LUNGS:  Clear.  CARDIAC:  Soft 1 to 2 over 6 systolic murmur in the aortic area and left  sternal border.  ABDOMEN:  Soft,  nontender.  EXTREMITIES:  With 1+ edema noted, some calf pain, 2+ pulses.   EKG shows minor nonspecific ST abnormality.  She is currently in a sinus  rhythm on telemetry.   IMPRESSION:  1.  Paroxysmal atrial fibrillation which is likely a combination of the      recent pulmonary embolus, hypertension, and age.  2.  Recent pulmonary embolus to pelvic fracture.  3.  Deep vein thrombosis.  4.  Hypertension.  5.  Diet-controlled diabetes.   RECOMMENDATIONS:  1.  At the present time, the patient is having paroxysmal atrial      fibrillation but is currently in sinus rhythm.  Her echocardiogram shows      preserved LV function with LVH.  She also has a slightly thickened      aortic valve and mitral anular calcification with mild to moderate      regurgitation.  2.  At the present time, I think that she should just be treated with beta-      blocker but would divide the dose into Atenolol 50 mg twice daily.  3.  I would continue her on Coumadin anticoagulation indefinitely because of      the atrial fibrillation, age, and hypertension even  after she has      convalesced from her pulmonary embolus.  Currently, she needs to have an      INR between 2.5 and 3.  When she gets over the pulmonary embolus, I      think that she could have an drop in her pro time/INR to 2 to 2.5.   Thank you for asking me to see her with you.      [name], M.D.  Electronically Signed     WST/MEDQ  D:  07/16/2005  T:  07/16/2005  Job:  [id number]   cc:   Melissa L. Ladona Ridgel, MD   Stacie Acres. Cliffton Asters, M.D.  Fax: [phone]   patient's room at [phone]

## 2010-12-11 NOTE — H&P (Signed)
NAME:  [name], [name]                    ACCOUNT NO.:  [id number]   MEDICAL RECORD NO.:  [id number]                   PATIENT TYPE:  INP   LOCATION:  NA                                   FACILITY:  [facility]   PHYSICIAN:  Ronnell Guadalajara, M.D.                DATE OF BIRTH:  January 08, 1922   DATE OF ADMISSION:  10/19/2002  DATE OF DISCHARGE:                                HISTORY & PHYSICAL   CHIEF COMPLAINT:  Pain in my left hip.   HISTORY OF PRESENT ILLNESS:  This 77 year old lady has been seen by Marlowe Kays, M.D. and Ronnell Guadalajara, M.D. for continuing progressive problems  concerning her left hip.  She has had intra-articular injections by Caralyn Guile. Ramos, M.D. in the past.  The first one did quite well.  The second one  offered very little relief, unfortunately.  X-rays have shown severe and  advanced degenerative changes of the left hip.  Some changes are seen on the  right but more complaints are on the left.  She has painful range of motion  with abduction, internal/external rotation.  She now has to walk with a  cane.  It is felt the patient would benefit with surgical intervention after  much discussion and the side effects of surgery and she is scheduled for  left total hip replacement arthroplasty.   The patient is to see W. Viann Fish, M.D. this next week for a stress  test.  Stacie Acres. White, M.D. is her family physician and is also aware of  the surgery.  We are planning for her to have the usual inpatient hip  protocol and we will ask for a [facility] consult and hopefully she  can finish her rehabilitation in the [facility].   PAST MEDICAL HISTORY:  This patient is really in pretty good health.  She  currently is being treated for hypertension.   PAST SURGICAL HISTORY:  Only surgery has been a colon resection 1999  secondary to rupture of the colon from colonoscopy.   FAMILY HISTORY:  Positive for colon cancer in the brother, a  stroke in  another brother, and a fatal thrombus in another brother.   SOCIAL HISTORY:  The patient is widowed.  She is retired.  She lives with  her daughter in a detached apartment.  She neither smokes nor drinks.   REVIEW OF SYSTEMS:  CNS:  No seizures, shoulder paralysis, numbness, double  vision.  RESPIRATORY:  No productive cough.  No hemoptysis.  No shortness of  breath.  CARDIOVASCULAR:  No chest pain.  No angina.  No orthopnea.  GASTROINTESTINAL:  No nausea, vomiting, melena, bloody stool.  GENITOURINARY:  No discharge, dysuria, hematuria.  MUSCULOSKELETAL:  Primarily in her hips.   PHYSICAL EXAMINATION:  GENERAL:  Alert, cooperative, friendly 77 year old  white female.  VITAL SIGNS:  Blood pressure 146/80, pulse 80, respirations 12.  HEENT:  Normocephalic.  Wears  glasses.  PERRLA.  EOM intact.  Oropharynx is  clear.  CHEST:  Clear to auscultation.  No rhonchi.  No rales.  HEART:  Regular rate and rhythm.  No murmurs are heard.  ABDOMEN:  Slightly obese, soft, nontender.  Liver, spleen not felt.  GENITALIA:  Not done.  Not pertinent to present illness.  RECTAL:  Not done.  Not pertinent to present illness.  PELVIC:  Not done.  Not pertinent to present illness.  BREASTS:  Not done.  Not pertinent to present illness.  EXTREMITIES:  Left hip as in present illness above.   ADMITTING DIAGNOSES:  1. Severe osteoarthritis of the left hip.  2. Hypertension.   PLAN:  The patient will be admitted for left total hip replacement  arthroplasty.  We plan for her to go to [facility] after regular  hospitalization.  Should she have any medical problems, will certainly call  Stacie Acres. White, M.D. or one of her associates to follow along with [place] in  the hospital and should she have any cardiac problems we will certainly call  upon W. Viann Fish, M.D. expertise.     Dooley L. Cherlynn June.                 Ronnell Guadalajara, M.D.    DLU/MEDQ  D:  10/10/2002  T:   10/10/2002  Job:  [id number]   cc:   Stacie Acres. White, M.D.  [address]  [place]  [place] [address]  Fax: [phone]

## 2010-12-11 NOTE — Discharge Summary (Signed)
[name], [name]          ACCOUNT NO.:  [id number]   MEDICAL RECORD NO.:  [id number]          PATIENT TYPE:  INP   LOCATION:  3707                         FACILITY:  [facility]   PHYSICIAN:  Melissa L. Ladona Ridgel, MD  DATE OF BIRTH:  1922-02-12   DATE OF ADMISSION:  07/14/2005  DATE OF DISCHARGE:  07/20/2005                                 DISCHARGE SUMMARY   CHIEF COMPLAINT AT ADMISSION:  Chest pain.   DISCHARGE DIAGNOSES:  1.  Chest pain secondary to pulmonary embolus.  The patient had recently      sustained a pelvic fracture and was immobile for quite some time.  She      returned to the hospital with chest discomfort and was discovered to      have a pulmonary embolus.  The patient was anticoagulated appropriately      and discharged to home for further care.  2.  Paroxysmal atrial fibrillation.  The patient was maintained on      appropriate anticoagulation for her pulmonary embolus.  Therefore, she      was therapeutically treated for the potential for stroke with her      paroxysmal atrial fibrillation.  She was seen and evaluated by Dr.      Viann Fish, who adjusted her rate controlling medications.  3.  Hypertension was well controlled during the course of the hospital stay      on her home regime of Atenolol, Avapro and lisinopril.  4.  Osteoporosis.  She will continue with her Evista.  5.  Diet controlled diabetes.  No adjustments were made in her medical      regime.  She is to continue with appropriate carbohydrate controlled      diet.   MEDICATIONS AT THE TIME OF DISCHARGE:  1.  Omeprazole 20 mg daily.  2.  Bumetanide 2 mg p.o. daily.  3.  Evista 60 mg daily.  4.  Atenolol 100 mg p.o. daily.  5.  Avapro 300 mg p.o. daily.  6.  Doxazosin 8 mg p.o. daily.  7.  Lisinopril 20 mg p.o. daily.  8.  Coumadin 5 mg p.o. daily.   The patient was instructed to call Dr. Cliffton Asters for an appointment in two to  three days to monitor her Coumadin.   STUDIES DURING THE  COURSE OF THE HOSPITAL STAY:  The patient underwent a  Doppler ultrasound of the lower extremity, which showed nonocclusive DVT in  the common femoral vein and a superficial thrombus of the greater saphenous  vein from the popliteal fossa to the saphenofemoral junction.  A chest x-ray  revealed mild cardiac enlargement with no acute pulmonary findings.  A CT  scan of the chest confirmed an acute pulmonary embolus in association with  an elevated D-dimer.   HISTORY OF PRESENT ILLNESS:  The patient is an 77 year old white female with  a known past medical history for hypertension, diet controlled diabetes, who  is status post a pelvic fracture on May 31, 2005.  The patient returned  to the hospital after developing chest discomfort and dizziness.  She was  found to be slightly  hypotensive.  A CT of the chest was undertaken and she  was found to have an acute pulmonary embolus.  The patient was admitted to  the telemetry floor, started on Lovenox and loaded with Coumadin.  The  patient did quite well.  However, over the course of the hospital stay, was  noted to have asymptomatic paroxysmal atrial fibrillation.  Rate was  controlled on her current Atenolol.  A consult was obtained with Dr. Viann Fish.  Recommendations were to titrate her Atenolol to effect and continue  her anticoagulation indefinitely.  The patient had no complications with the  titrations of her medications.  She ultimately became therapeutic with her  INR and on the day of discharge is noted to have an INR of 2.3.  Dr. Renford Dills evaluated the patient on July 20, 2005 and noted her to be  clinically stable, afebrile, chest was clear and cardiovascular was regular.  She was deemed stable for discharge to home to follow up with her primary  care physician, Dr. Laurann Montana.      Melissa L. Ladona Ridgel, MD  Electronically Signed     MLT/MEDQ  D:  09/01/2005  T:  09/01/2005  Job:  [id number]   cc:   Stacie Acres. Cliffton Asters, M.D.  Fax: [phone]   W. Viann Fish, M.D.  Fax: [phone]  Email: [email or web address]

## 2010-12-11 NOTE — Discharge Summary (Signed)
NAME:  [name], [name]                    ACCOUNT NO.:  [id number]   MEDICAL RECORD NO.:  [id number]                   [name] TYPE:  IPS   LOCATION:  4153                                 FACILITY:  [facility]   PHYSICIAN:  Ranelle Oyster, M.D.             DATE OF BIRTH:  04/10/1922   DATE OF ADMISSION:  10/23/2002  DATE OF DISCHARGE:  10/31/2002                                 DISCHARGE SUMMARY   ADMISSION DIAGNOSES:  1. Coumadin level of less than 2.  2. Hypertension with orthostasis.  3. Gastrointestinal bleed.   HISTORY OF PRESENT ILLNESS:  [name] is a 77 year old female with  history of  hypertension, progressive left hip pain secondary to severe  generalized DJD, who elected to undergo left total hip replacement 10/15/02  by Dr. Montez Morita.  Postoperatively, [name] is weightbearing as tolerated  on Coumadin for DVT prophylaxis.   She has had some problems with hypertension question secondary to one of [name]  conclusions was his narcotics versus antihypertensives.  Cardiac enzymes  done were negative.  Dr. Donnie Aho was consulted and has been following [name]  [name].  Medications were adjusted.  [name] has some problems with  fluid leakage from hip incision.  Coumadin was given for wound prophylaxis.  PT initiated and [name] was max assist for transfers, mod to max assist  ambulating 10 feet x2.   PAST MEDICAL HISTORY:  1. Hypertension.  2. Traumatic colon rupture requiring resection.  3. TIAs.  4. Renal calculi.   ALLERGIES:  CLONIDINE.   SOCIAL HISTORY:  [name] is widowed and lives with daughter in a  detached apartment.  Does not use any tobacco or alcohol.  Daughter can  assist post discharge.   HOSPITAL COURSE:  [name] was admitted to rehab on March 30, for  inpatient therapies consisting of PT and OT therapy.  Past admissions showed  maintained on Coumadin for DVT prophylaxis and iron for postop anemia.  Dr.  Donnie Aho has been following along  for blood pressure issues and has adjusted  her medicine throughout her stay.  Initially Lotensin was held but this  resumed by [name] time of discharge.  Labs on past admission showed hemoglobin  10.8, hematocrit 31.8, white count 7.7 and platelets 220,000.  Sodium 132,  potassium 3.4, chloride 102, CO2 34.0, BUN 15, creatinine 0.9, glucose 102.  Her left hip incision was noted to have serous drainage with lower extremity  erythema that had resolved by [name] time of discharge.  [name]'s  hypobilirubinemia was supplemented.  Repeat labs on April 2 showing  potassium at 4.0.  [name] participated in therapy and at [name] time of  discharge had progressed to modified independent for transfers, modified  independent ambulating with a rolling walker.  She did require some  supervision for bed mobility.  She was mini assist for navigating stairs and  car transfers.  [name] was supervision for ADL needs  including  toileting.  Further followup therapy should include home health PT.  That  was set up by [facility].  On October 31, 2002 [name]  was discharged to home.   DISCHARGE MEDICATIONS:  1. Coumadin 5 mg per day.  2. Avapro 300 mg a day.  3. Lasix 20 mg a day.  4. Atenolol 100/25 per day.  5. _________  mg q.h.s.  6. Skelaxin 4 mg p.o. q.i.d. p.r.n.  7. Omeprazole 20 mg per day.  8. Os-Cal + D one p.o. t.i.d.  9. Quinidine sulfate ___ mg p.o. per day.  10.      ________  mg per day.  11.      Trinsicon one p.o. t.i.d.   DIET:  Regular.   WOUND CARE:  Clean and dry.   DISCHARGE INSTRUCTIONS:  No strenuous activities, walking or driving.  Continue left total hip precautions and use walker for now.  Progressive  PT/OT to be continued by [facility].  [facility] to  follow her Coumadin through 11/19/02.   FOLLOW UP:  With Dr. Debria Garret and Dr. Donnie Aho in [name] next two to three  weeks.  Follow up with Dr. Hermelinda Medicus as needed.      Greg Cutter, P.A.                    Ranelle Oyster, M.D.    PP/MEDQ  D:  12/31/2002  T:  12/31/2002  Job:  [id number]   cc:   Stacie Acres. White, M.D.  [address]  [place]  [place] [address]  Fax: [phone]   W. Ashley Royalty., M.D.  [address], [address]  [place]  [place] [address]  Fax: [phone]   Ronnell Guadalajara, M.D.  [address]  [place]  [place] [address]  Fax: [phone]

## 2010-12-11 NOTE — Op Note (Signed)
NAME:  [name], [name]                    ACCOUNT NO.:  [id number]   MEDICAL RECORD NO.:  [id number]                   PATIENT TYPE:  INP   LOCATION:  0002                                 FACILITY:  [facility]   PHYSICIAN:  Madlyn Frankel. Charlann Boxer, M.D.               DATE OF BIRTH:  1921/09/11   DATE OF PROCEDURE:  08/30/2003  DATE OF DISCHARGE:                                 OPERATIVE REPORT   PREOPERATIVE DIAGNOSES:  Right hip end-stage osteoarthritis with leg length  discrepancy, right shorter than left.   POSTOPERATIVE DIAGNOSES:  Right hip end-stage osteoarthritis with leg length  discrepancy, right shorter than left.   PROCEDURE:  Right total hip replacement.   COMPONENT USED:  DePuy S-Rom hip system with a 52 pinnacle cup, single  cancellous screw and 32 neutral liner. An S-Rom femoral system with an 1813  stem and 18B large sleeve with a 32.0 ball.   SURGEON:  Madlyn Frankel. Charlann Boxer, M.D.   ASSISTANT:  Clarene Reamer, P.A.-C.   ANESTHESIA:  General.   ESTIMATED BLOOD LOSS:  300   DRAINS:  Drains x1.   COMPLICATIONS:  None apparent.   INDICATIONS FOR PROCEDURE:  [name] is a very pleasant 77 year old  female that is status post left total hip replacement in March of 2004. She  was tolerating this procedure well.  It was complicated by a single  dislocation on the left which happed within a couple months of the initial  procedure.  Postoperatively, the patient noted her leg lengths were off too  with the left leg longer than the right.  After reviewing her radiographic  findings of end-stage osteoarthritis and the fact that she had failed  conservative management as she had on the left, she opted to have the right  total hip replacement.  Radiographically measuring from roughly around the  tip of her lesser trochanter to a pelvic tear drop, she appeared at least 25  mm longer on the left than the right.  Clinically this seemed relevant and  may have been a little bit  shorter; however, she did have this discrepancy.  Based on this preoperative template, an intraoperative incision was made in  order to try to lengthen her about 2 cm within a range allowable for the  sciatic nerve.  After discussing these risks and benefits, she consented for  right total hip replacement.   DESCRIPTION OF PROCEDURE:  The patient was brought to the operative theatre.  Once adequate anesthesia and preoperative antibiotics were administered, the  patient was positioned in the left lateral decubitus position with the right  side up.  The right lower extremity was then prepped and draped in a sterile  fashion.  A lateral based incision was made from a posterior approach of the  hip.  Short external rotators and posterior attachments were taken down  separately for attempted later repair which was not possible and was later  excised.  The  hip was dislocated and a neck osteotomy was made based off of  the preoperative template and anatomic landmarks. Following exposure of the  acetabulum, attention was directed to the acetabulum first.  Debridement of  the labrum and inferior capsular tissues were carried out, reaming commenced  with a 47 reamer carried up to a 51 reamer and 52 mm pinnacle cup impacted  into position.  Final position of the cuff appeared to be about 45 degrees  of abduction, 20 egress of forward flexion. Given the anatomy of her pelvis  this may flatten out to about 35 degrees.  Following placement of the cup  which had excellent purchase circumferentially, a single cancellous bone  screw was placed in the pelvis for initial stability.  A trial liner was  placed and attention was directed to the femur.  Based on the radiographs,  it appeared that her acetabular shell was slightly lower than the tear drop  on the left and some of her leg length discrepancy may have been there.  Based on this, attempts were made to gain some of her length back to the  femur.   Preparation of the femur was carried out for S-Rom protocol without  difficulty.  A trial 18B large sleeve was then tacked in position and trial  reduction was carried out. Note that the positioning of the sleeve was  basically anatomic but was about 5 degrees of anteversion and for this  reason, 20 degrees of anteversion was clicked into the S-Rom trial  component. Trial reduction was carried out with a 32 plus head.  The hip was  noted to be very stable with flexion and neutral abduction, internal  rotation to at least 80 degrees. She had stable sleep position.  She was  noted to abduct and be able to externally rotate without difficulty;  however, she did have a slight anterior capsule.  This was confirmed and  noted that this would be expected with an attempted leg length lengthening.  Based on this a capsulotomy was carried out anteriorly to allow for some  further displacement of the hip system.  Trial reduction was carried out and  the anterior structures were definitely released at this point.  Following  this trial reduction not only was she stable on internal and external  rotation but her leg lengths seemed markedly improved from the preoperative  state. Based on this, the final components were brought onto the field, a  final 32 neutral liner was impacted into position followed by the placement  of the femoral stem with an 18B large sleeve and 18/13  stem that was placed  at 20 degrees of anteversion based on the position of the sleeve.  Final  combined anteversion was roughly 45 degrees.  The hip remained stable.  Again leg lengths seemed much more appropriate at this point. She may still  be a little shorter on the right side; however, did not want to sacrifice  lengthening her too much based on the sciatic nerve.  Following this  procedure, the capsular tissues were unable to be reapproximated along the  sides. The short external rotators were found to be __________ and  not reapproximated secondary to __________.  The wound was copiously irrigated  with normal saline solution, a medium Hemovac drain was placed deep. The  iliotibial band and gluteus maximus fascia were then reapproximated to one  another.  The subcutaneous layer was closed in two layers followed by 4-0  Monocryl subcuticular stitch. The  hip was then cleaned, dried and dressed  sterilely. The patient was extubated and transported to the recovery room,  where her sciatic nerve function will  examined.  She tolerated the  procedure without complications and was transported to the recovery room.                                               Madlyn Frankel Charlann Boxer, M.D.    MDO/MEDQ  D:  08/30/2003  T:  08/30/2003  Job:  [id number]

## 2010-12-11 NOTE — Consult Note (Signed)
NAME:  [name], [name]                    ACCOUNT NO.:  [id number]   MEDICAL RECORD NO.:  [id number]                   PATIENT TYPE:  INP   LOCATION:  0463                                 FACILITY:  [facility]   PHYSICIAN:  Colleen Can. Deborah Chalk, M.D.            DATE OF BIRTH:  1922/07/04   DATE OF CONSULTATION:  10/20/2002  DATE OF DISCHARGE:                                   CONSULTATION   HISTORY OF PRESENT ILLNESS:  Thank you very much for asking me to see Ms.  Tippen postoperatively.  She had a left total hip performed on 10/19/2002,  and had postop hypotension through the night.  She apparently approximately  600 mL of blood but had a preop hematocrit of 40 with hematocrit only  dropping to the 33-34 range.  She was managed with IV fluids through the  night but has continued to have postop hypotension.  She is referred for  evaluation.  Her medications have been held at this point in time.   SOCIAL HISTORY:  She has been a widow for five years, has one daughter, five  grandchildren.  No cigarettes, no alcohol.  Previously worked as an Photographer.  She has not had much exercise because of her hip.   PAST MEDICAL HISTORY:  She has had an evaluation with adenosine Cardiolite  by [name], M.D. preoperatively that basically was okay.  She has  had a colon rupture in the past after colonoscopy and had surgery in that  regard.  She has had a herniorrhaphy.   ALLERGIES:  CLONIDINE causes rash.   CURRENT MEDICINES:  1. Norvasc 5 mg daily, recently added.  2. Avapro 300 mg daily.  3. Furosemide 20 mg daily.  4. Doxozosin 8 mg daily.  5. Atenolol 100-25 daily.  6. Lisinopril 40 mg daily.  7. Omeprazole 20 mg daily.  8. Coumadin.   REVIEW OF SYSTEMS:  She has had a history of indigestion.  She has her hip  __________which limits her a great deal.  There has been no history of GI  bleeding.   PHYSICAL EXAMINATION:  VITAL SIGNS:  Blood pressure 95/60 sitting, heart  rate 70.  GENERAL:  She is alert, in no acute distress.  LUNGS:  Clear.  HEART:  Regular rate and rhythm, grade 2/6 systolic ejection quality murmur.  ABDOMEN:  Soft without masses, normal bowel sounds.  EXTREMITIES:  There is no lower extremity edema.  The hip wound was not  examined.   LABORATORY DATA:  EKG preoperatively showed nonspecific ST T-wave changes.  Postoperative EKG is pending.   Preop hematocrit was 40.  Today, her hematocrit is 33.   Her pertinent labs showed BUN of 39, creatinine 1.6, potassium 5.4.    OVERALL IMPRESSION:  1. Status post left total hip.  2. Postop hypotension, probably volume-related versus possible related to     analgesics or residual from antihypertensives.  3. Hypertension, previously refractory  and on considerable medications.   PLAN:  1. Will increase IV fluids.  2. We will restart antihypertensives gradually.  3. Watch urine output.  4. We will follow with you.                                               Colleen Can. Deborah Chalk, M.D.    SNT/MEDQ  D:  10/20/2002  T:  10/20/2002  Job:  [id number]   cc:   Lacretia Nicks. Ashley Royalty., M.D.  [address], [address]  [place]  [place] [address]  Fax: [phone]   Ronnell Guadalajara, M.D.  [address]  [place]  [place] [address]  Fax: [phone]

## 2010-12-11 NOTE — Discharge Summary (Signed)
NAME:  [name], [name]                    ACCOUNT NO.:  [id number]   MEDICAL RECORD NO.:  [id number]                   PATIENT TYPE:  INP   LOCATION:  0464                                 FACILITY:  [facility]   PHYSICIAN:  Madlyn Frankel. Charlann Boxer, M.D.               DATE OF BIRTH:  11-27-1921   DATE OF ADMISSION:  08/30/2003  DATE OF DISCHARGE:  09/03/2003                                 DISCHARGE SUMMARY   ADMISSION DIAGNOSES:  1. Osteoarthritis of the right hip.  2. Hypercholesterolemia.  3. Hypertension.  4. Gastroesophageal reflux disease.  5. History of miny stroke.   DISCHARGE DIAGNOSES:  1. Osteoarthritis of the right hip, status post right total hip     arthroplasty.  2. Hypercholesterolemia.  3. Hypertension.  4. Gastroesophageal reflux disease.  5. History of miny stroke.  6. Postoperative hemorrhagic anemia, stable at the time of discharge.   PROCEDURE:  The patient was taken to the operating room on August 30, 2003,  and underwent a right total hip arthroplasty.  Surgeon was Dr. Durene Romans.  Assistant was [employer], P.A.-C.  Surgery was done under general  anesthesia, and a Hemovac drain x1 was placed at the time of surgery.   CONSULTATIONS:  1. Physical therapy.  2. Occupational therapy.  3. Social work.  4. Case management.  5. Physical medicine and rehabilitation with Dr. Thomasena Edis.   HISTORY:  The patient is a 77 year old female who has seen Dr. Charlann Boxer for  evaluation of her right hip.  She is now eight months status post left total  hip arthroplasty.  She was last seen on March 29, 2003, for followup of  her left total hip and noted to have an increased amount of pain in her  right hip at that time.  She presented to Dr. Charlann Boxer for followup, and the  right hip continues to bother her, particularly at night with pain in the  groin that will go down to the anterior aspect of her leg.  The more she  walks or participates in activities the more pain she has in  her groin.  After reviewing x-rays, Dr. Charlann Boxer felt it was best at this point to proceed  with a right total hip arthroplasty.  The patient agreed.  Risks and  benefits of the surgery were discussed with the patient and the patient  wishes to proceed.   LABORATORY DATA:  CBC on admission showed a hemoglobin of 13.2, hematocrit  of 39, white blood cell count 7.3, red blood cell count 4.23.  Serial H&H  were followed throughout hospital stay.  Hemoglobin and hematocrit did  decline to 9.4 and 28.7 on September 01, 2003.  However, on September 02, 2003,  they were on the rise at 9.6 and 29, respectively, and stable at the time of  discharge.  Differential on admission showed eosinophils high of 8.  Coagulation studies on admission were all within normal limits.  PT/INR  at  the time of discharge were 20.4 and 2.2, respectively, on Coumadin therapy.  Routine chemistries on admission showed sodium slightly high at 146, CO2  slightly high at 33, glucose slightly high at 101, and BUN high at 27.  Serial chemistries were followed throughout the hospital stay.  Sodium did  return to normal on August 31, 2003, at 138, and remained normal throughout  the rest of the hospital stay.  CO2 also returned to normal on August 31, 2003, at 61 and remained normal throughout the rest of the hospital stay.  Glucose ranged from a low of 119 on September 02, 2003, to 163 on August 28, 2003.  The BUN also did return to normal on February 5, 205, at 20 and  remained normal throughout the rest of the hospital stay.  Urinalysis on  admission showed a trace amount of leukocyte esterase.  Followup urinalysis  on September 02, 2003, was all within normal limits.  The patient's blood type  is O positive with antibody negative.  Urine culture had no growth at one  day.  The patient had a preoperative Adenosine Cardiolite which revealed  normal Adenosine Cardiolite scan with no evidence of ischemia or infarction  with normal  ejection fraction of 79%.  Preoperative x-rays of the right hip  revealed severe osteoarthritis of the right hip.  Followup x-rays of the  right hip revealed normal appearance, status post right total hip  arthroplasty.   HOSPITAL COURSE:  The patient was admitted to [facility] and taken  to the operating room.  She underwent the above stated procedure without  complication.  The patient tolerated the procedure well, and allowed to  return to the recovery room and then to the orthopaedic floor for continued  postoperative care.  On postoperative day #1, the patient had some  postoperative pain as expected, but it was controlled.  Hemoglobin was 10.4.  She was neurovascularly intact to the right lower extremity.  The patient  was to work with physical therapy and occupational therapy.  On September 01, 2003, postoperative day #2, the patient was doing okay.  Pain was well  controlled.  She was afebrile at this time.  She was neurovascularly intact  distally.  The wound was clean, dry, and intact.  Hematocrit was 28.7.  The  patient was continuing to work with physical therapy and occupational  therapy.  On September 02, 2003, postoperative day #3, the patient was without  any complaints.  Hemoglobin and hematocrit were 9.6 and 29.  Temperature max  was 101.2.  She was 99 at the time of being seen.  She was neurovascularly  intact distally.  Incision was clean, dry, and intact.  The patient  continued to work with physical therapy and occupational therapy.  PCA was  discontinued on this day.  IV was also hep-locked on this day.  The patient  was also to be seen for a possible rehab admission.  On September 03, 2003,  postoperative day #4, the patient was resting comfortably and was afebrile.  Neurovascularly intact to the right lower extremity.  Incision was clean, dry, and intact.  The patient continued to work with physical therapy and  occupational therapy with the hope to discharge  to rehab on this date, and  she was subsequently discharged to rehab on September 03, 2003.   DISPOSITION:  The patient is discharged to rehab on September 03, 2003.   DISCHARGE MEDICATIONS:  1. Colace 100  mg one p.o. b.i.d.  2. Trinsicon one caplet p.o. t.i.d.  3. Enema of choice one unit p.r. p.r.n.  4. Reglan 10 mg p.o. q.8h. p.r.n.  5. Phenergan 25 mg p.o. q.6h. p.r.n.  6. Vicodin one to two p.o. q.4-6h. p.r.n.  7. Tylenol 325 to 650 mg p.r.n. for temperature greater than 101.5 q.4h.  8. Robaxin 500 mg one p.o. q.6-8h. p.r.n.  9. Restoril 15 to 30 mg p.o. q.h.s. p.r.n.  10.      Benadryl 25 mg p.o. q.4h. p.r.n. itching and q.h.s. p.r.n. sleep.  11.      Coumadin per pharmacy protocol.  12.      Norvasc 2.5 mg one p.o. daily.  13.      Avapro 300 mg one p.o. daily.  14.      Tenormin 100 mg one p.o. daily.  15.      Hydrochlorothiazide 25 mg one p.o. daily.  16.      Lasix 20 mg one p.o. daily.  17.      Allegra 60 mg one p.o. daily p.r.n.  18.      Multivitamin one p.o. daily.  19.      Quinine sulfate 325 mg one p.o. q.h.s. p.r.n.  20.      Cardura 8 mg one p.o. q.h.s.  21.      Protonix 40 mg one p.o. daily.  22.      Evista 60 mg one p.o. daily.   DIET:  As tolerated.   ACTIVITY:  Total hip precautions.  She is partial weightbearing 50% of her  body weight to the right lower extremity.  Genevieve Norlander for home care after  discharge from rehab.   WOUND CARE:  The patient is to have daily dressing changes performed until  no drainage.  She may shower when no drainage.   FOLLOWUP:  The patient is to follow up two weeks from the date of surgery.  Is to call for an appointment at [phone].   CONDITION ON DISCHARGE:  Stable and improved.     Clarene Reamer, P.A.-C.                   Madlyn Frankel Charlann Boxer, M.D.    SW/MEDQ  D:  09/24/2003  T:  09/24/2003  Job:  [id number]

## 2010-12-11 NOTE — H&P (Signed)
NAME:  [name], [name]                    ACCOUNT NO.:  [id number]   MEDICAL RECORD NO.:  [id number]                   PATIENT TYPE:  INP   LOCATION:  NA                                   FACILITY:  [facility]   PHYSICIAN:  Madlyn Frankel. Charlann Boxer, M.D.               DATE OF BIRTH:  February 28, 1922   DATE OF ADMISSION:  DATE OF DISCHARGE:                                HISTORY & PHYSICAL   ANTICIPATED DATE OF ADMISSION:  August 30, 2003.   CHIEF COMPLAINT:  Right hip pain.   HISTORY OF PRESENT ILLNESS:  The patient is a 77 year old female who sees  Dr. Charlann Boxer for evaluation of her right hip.  She is almost 8 months status  post left total hip replacement.  She was last seen on March 29, 2003 for  follow up of her left total hip and was noted to have right hip pain at the  time.  She presented to Dr. Charlann Boxer today in that her right hip continues to  bother her particularly at night with pain in the groin that will go down  the anterior aspect of her leg.  The more she walks and participates in  activities the more pain she has in her groin.  After reviewing the x-rays,  Dr. Charlann Boxer feels that it is best at this point to proceed with a right total  hip arthroplasty.  The patient agrees.  Risks and benefits of the surgery  have been discussed with the patient and the patient wishes to proceed.   PAST MEDICAL HISTORY:  1. Hypercholesterolemia.  2. Hypertension.  3. Gastroesophageal reflux disease.  4. Mini-stroke.   PAST SURGICAL HISTORY:  1. Hernia.  2. Left total hip arthroplasty.   MEDICATIONS:  1. Norvasc 2.5 mg one p.o. daily.  2. Avapro one p.o. daily.  3. Atenolol/HCTZ 100/25 mg one p.o. daily.  4. Doxazosin mesylate one p.o. q.h.s.  5. Evista 60 mg one p.o. daily.  6. Omeprazole 20 mg one p.o. daily.  7. Allegra 60 mg one p.o. b.i.d. p.r.n.   ALLERGIES:  CLONIDINE causes a rash.   SOCIAL HISTORY:  The patient denies any tobacco or alcohol use.  She is a  widow.  She lives in a  one-story house with two steps entering the house and  her daughter will be her caregiver after surgery.   FAMILY HISTORY:  Father cerebrovascular accident.  Mother history of  pneumonia.   REVIEW OF SYSTEMS:  GENERAL:  Denies fever, chills, night sweats, bleeding  tendencies.  CENTRAL NERVOUS SYSTEM:  Denies blurry or double vision,  seizures, headaches, paralysis.  RESPIRATORY:  Denies shortness of breath,  productive cough, hemoptysis.  CARDIOVASCULAR:  Denies chest pain, angina,  orthopnea.  GI:  Denies nausea, vomiting, diarrhea, constipation, melena,  bloody stools.  GU:  Denies dysuria, hematuria, or discharge.  MUSCULOSKELETAL:  Pertinent as to the HPI.   PHYSICAL EXAMINATION:  VITAL SIGNS:  Blood pressure  138/75, pulse 68,  respirations 12.  GENERAL:  A well-developed, well-nourished 77 year old female.  HEENT:  Normocephalic, atraumatic.  Pupils equal, round, reactive to light.  NECK:  Supple.  No carotid bruit noted.  CHEST:  Clear to auscultation bilaterally.  No wheezes or crackles.  HEART:  Regular rate and rhythm.  No murmurs, rubs, or gallops.  ABDOMEN:  Soft, nontender, nondistended.  Positive bowel sounds x4.  EXTREMITIES:  She has painful limited range of motion of the right hip.  She  is neurovascularly intact to the right lower extremity.  SKIN:  No rashes or lesion.   X-ray reveals bone-on-bone osteoarthritis of the right hip.   IMPRESSION:  1. Osteoarthritis of the right hip.  2. Hypercholesterolemia.  3. Hypertension.  4. Gastroesophageal reflux disease.  5. History of mini-stroke.   PLAN:  The patient will be admitted to [facility] on August 30, 2003 and undergo a right total hip arthroplasty by Dr. Durene Romans.     Clarene Reamer, P.A.-C.                   Madlyn Frankel Charlann Boxer, M.D.    SW/MEDQ  D:  08/22/2003  T:  08/22/2003  Job:  [id number]

## 2010-12-11 NOTE — Op Note (Signed)
NAME:  [name], [name]                    ACCOUNT NO.:  [id number]   MEDICAL RECORD NO.:  [id number]                   PATIENT TYPE:  INP   LOCATION:  0466                                 FACILITY:  [facility]   PHYSICIAN:  Erasmo Leventhal, M.D.         DATE OF BIRTH:  03-Feb-1922   DATE OF PROCEDURE:  12/02/2002  DATE OF DISCHARGE:                                 OPERATIVE REPORT   HISTORY:  [name] is a 77 year old female patient of Dr. Debria Garret. Approximately six weeks ago, he performed a left total hip  arthroplasty. She did well until this evening when she was getting up from a  chair, hyperflexed and pushed and dislocated her left total hip  arthroplasty. She presented to the [facility] Emergency Room, she was  evaluated there by the staff and I was called.   I evaluated the patient in the emergency room with Jaquelyn Bitter. Chabon, P.A.-  C. where she was found to have a left total hip arthroplasty superior  posterior dislocation. Treatment options were discussed with the patient and  female in detail and the risk and benefits and they wish to proceed with a  closed reduction under general anesthesia.   PREOPERATIVE DIAGNOSIS:  Left total hip arthroplasty superior posterior  dislocation.   POSTOPERATIVE DIAGNOSIS:  Left total hip arthroplasty superior posterior  dislocation.   PROCEDURE:  Closed reduction of dislocated total hip arthroplasty,  examination under anesthesia, utilization of C-arm fluoroscopy.   SURGEON:  Erasmo Leventhal, M.D.   ASSISTANT:  Jaquelyn Bitter. Chabon, P.A.-C.   ANESTHESIA:  General.   ESTIMATED BLOOD LOSS:  None.   COMPLICATIONS:  None.   DISPOSITION:  To PACU stable.   DESCRIPTION OF PROCEDURE:  The patient had been counseled previously. She  was taken to the operating room, placed in the supine position under general  anesthesia. At this point in time, she underwent a general closed reduction  performed by gentle  longitudinal traction, flexion of the hip, internal and  external rotation and it relatively easily went back into place. At this  point in time, her leg was checked, her leg lengths were relatively  symmetric. Circulation was intact. At this time, I then used the C-arm to  confirm that she was stable past 90 degrees of flexion and 30 degrees of  internal rotation. There were no complications or problems with this as we  used the C-arm to documented that. She was placed in an abduction pillow and  gently awakened. There were no complications. Finally, stabilization at the  PACU and kept overnight for overnight stay.                                              Erasmo Leventhal, M.D.   RAC/MEDQ  D:  12/02/2002  T:  12/03/2002  Job:  [id number]

## 2010-12-11 NOTE — H&P (Signed)
[name], [name]          ACCOUNT NO.:  [id number]   MEDICAL RECORD NO.:  [id number]          PATIENT TYPE:  EMS   LOCATION:  [facility]JO                         FACILITY:  [facility]   PHYSICIAN:  Deirdre Peer. Polite, M.D. DATE OF BIRTH:  [birth year]   DATE OF ADMISSION:  07/13/2005  DATE OF DISCHARGE:                                HISTORY & PHYSICAL   CHIEF COMPLAINT:  Chest pain.   HISTORY OF PRESENT ILLNESS:  [name] is an 77 year old female with a  known history of hypertension, diet-controlled diabetes, recent pelvic  fracture diagnosed approximately May 31, 2005, who comes to the ED after  having an episode of chest pains.  According to the patient and her  daughter, she was in her usual state of health until today around 3:30.  She  had a fleeting episode of chest discomfort and felt dizzy.  The patient had  recurrent symptoms around 6:30.  At that time, her daughter evaluated her  and checked her blood pressure.  It was 85/58 with a pulse of 54.  Follow up  blood pressure was 89/63 with a pulse of 138.  She brought the patient to  the ED for further evaluation.  In the ED, the patient was evaluated and  found to have a blood pressure of 130/67, tachycardic at 131, satting 94%.  The patient had screening labs and point of care enzymes within normal  limits.  CBC within normal limits.  BMET within normal limits, except for a  creatinine of 1.7.  D-dimer was elevated at 9.  The patient had a follow up  CT of the chest which showed acute PE.  Admission was deemed necessary for  further evaluation and treatment.   PAST MEDICAL HISTORY:  As stated above, significant for -  1.  Hypertension.  2.  Diet-controlled diabetes.  3.  Recent diagnosis of pelvic fracture, which has had her incapacitated      over the last 6 weeks; however, she has been ambulating with a walker.   MEDICATIONS ON ADMISSION:  1.  Omeprazole 20 mg daily.  2.  Bumetanide 2 mg daily.  3.  Evista 60 mg  daily.  4.  Atenolol 100 mg daily.  5.  Avapro 300 mg daily.  6.  Doxazosin 8 mg daily.  7.  Lisinopril 20 mg daily.   SOCIAL HISTORY:  Negative for tobacco, alcohol, or drugs.   PAST SURGICAL HISTORY:  Hip replacement in the past.  (Denies appendectomy or cholecystectomy.)   ALLERGIES:  CLONIDINE causes pruritus.   FAMILY HISTORY:  The patient stated that she has a brother who has had a  history of a DVT and a massive PE.   REVIEW OF SYSTEMS:  Negative for fever, chills, nausea, vomiting.  No  diarrhea, no constipation.  The patient does admit to daily leg pains and  cramps with occasional swelling.   PHYSICAL EXAMINATION:  GENERAL:  The patient is alert and oriented x3.  Blood pressure 138/74, pulse 110, respiratory rate 18, satting 97%.  HEENT:  Within normal limits.  CHEST:  Clear.  CARDIOVASCULAR:  Regular.  ABDOMEN:  Nontender.  EXTREMITIES:  Positive for calf pain bilateral with minimal palpation.  Trace edema.  There were 2+ pulses.  NEUROLOGIC:  Nonfocal.   LABORATORY DATA:  CBC with a white count of 7.3, hemoglobin 13, MCV 90,  platelets 252, neutrophil count 66%, creatinine 1.7.  Sodium 135, potassium  3.9, chloride 102, BUN 38.  CT of the chest acute PE to a branch of the left  upper lobe pulmonary artery, old granulomatous disease, scarring in the  lower lobes.  No acute cardiopulmonary disease otherwise.  Cardiomegaly with  left ventricular dominance.  Large hiatal hernia.  Multi-nodular goiter.   ASSESSMENT:  1.  Acute pulmonary embolism.  2.  Chest pain secondary to #1.  3.  Hypertension.  4.  History of pelvic fracture in November 2006, making the patient fairly      immobilized since then.  5.  Azotemia with creatinine of 1.7.  Unknown baseline.   RECOMMENDATIONS:  She will be admitted to a telemetry floor bed.  The  patient will be treated for PE with Lovenox and Coumadin.  We will obtain  Doppler ultrasound of her legs.  Since the patient is 77 years  of age, there  is uncertain significance of her hypercoagulable panel at this time.  As for  the patient's azotemia, will hold her ACE inhibitor and check follow up labs  in the a.m.  Will make further recommendations after review of the blood  studies.      Deirdre Peer. Polite, M.D.  Electronically Signed     RDP/MEDQ  D:  07/14/2005  T:  07/14/2005  Job:  [id number]

## 2010-12-11 NOTE — Discharge Summary (Signed)
NAME:  [name], [name]                    ACCOUNT NO.:  [id number]   MEDICAL RECORD NO.:  [id number]                   PATIENT TYPE:  INP   LOCATION:  0463                                 FACILITY:  [facility]   PHYSICIAN:  Ronnell Guadalajara, M.D.                DATE OF BIRTH:  1921/09/07   DATE OF ADMISSION:  10/19/2002  DATE OF DISCHARGE:                                 DISCHARGE SUMMARY   DATE OF ANTICIPATED TRANSFER TO [facility]:  October 23, 2002   ADMITTING DIAGNOSES:  1. Severe degenerative arthritis of the left hip.  2. Hypertension.   DISCHARGE DIAGNOSES:  1. Severe degenerative arthritis of the left hip.  2. Hypertension.  3. Mild postoperative anemia.  4. Postoperative hypotensive episode.   CONSULTS:  1. Dr. Colleen Can. Deborah Chalk, cardiology.  2. Dr. Viann Fish, cardiology.  3. Dr. Ellwood Dense, physical medicine and rehabilitation at [facility]. [facility].   OPERATION:  On October 19, 2002 the patient underwent left total hip  replacement arthroplasty with [employer] fully uncemented system; Dr. Ranee Gosselin assisted.   BRIEF HISTORY:  This 77 year old lady had been seen by both Dr. Simonne Come  and Dr. Montez Morita with continuing problems concerning the left hip.  She has  had progressive problems concerning deterioration with her day-to-day  activities due to pain in the left hip.  X-rays have shown severe and  advanced degenerative arthritic changes of the left hip with near bone-on-  bone deformity.  She currently is unable to walk without assistance and now  uses a cane.  Conservative treatment including anti-inflammatories as well  as intraarticular injections to the hip unfortunately have not provided her  with relief to the point where she can carry on with her day-to-day  activities.  She was seen by Dr. Viann Fish preoperatively for clearance  and was scheduled for the above procedure.   COURSE IN THE HOSPITAL:  The patient  tolerated the surgical procedure quite  well.  Unfortunately, postoperatively she had a hypotensive episode  throughout the first postoperative evening.  Dr. Deborah Chalk who was on call for  Dr. Donnie Aho saw the patient, adjusted her medications, increased her IV  fluids, and followed along.  Once her blood pressure was stable she had no  further episodes.  Her cardiac studies done showed the CK, CK-MB and  troponin I to be normal.  She did not have a cardiac episode.  Dr. Donnie Aho  and Dr. Deborah Chalk continued to follow the patient throughout her  hospitalization.   Unfortunately, the patient was very slow with her total hip protocol.  She  was actually only to achieve minimal ambulation with maximum assist.  She  could move about in the bed with moderate assist.  She achieved transferring  to bedside chair.  We did allow weightbearing as tolerated on the left hip.   As she was slow in her activities and  would need to be at a higher safety  level for her own activities of daily living as well as ambulation, we asked  for and received an inpatient consult from Dr. Thomasena Edis of physical medicine  and rehab at [facility].  He agreed that for the patient's safety  an inpatient rehabilitation program would be indicated and arrangements were  made for that.   The day of discharge the patient was afebrile.  Her hemoglobin was stable at  10.5, hematocrit was 30.9.  Vital signs were stable.  She had no further  hypotensive episodes.  I discussed the possibility of a bed at  rehabilitation today.  If one is available we will go ahead for transfer;  otherwise, she will be transferred tomorrow.   The left hip wound had a minimal amount of serous drainage.  The  neurovascular was intact to the left lower extremity.  She had minimal  discomfort with gentle range of motion of the left hip.  Her PCA and IVs  were discontinued.  Oxygen was discontinued as well.   LABORATORY DATA:  Laboratory values in  the hospital hematologically showed a  preoperative CBC which was completely within normal limits - hemoglobin was  14, hematocrit 40.3.  Final hemoglobin showed 10.5 with hematocrit 30.9 and  this was stable.  Differential was within normal limits.  Blood chemistries  were essentially normal throughout her hospital stay.  Urinalysis was  negative for urinary tract infection.  The preoperative chest x-ray showed  cardiomegaly with slight pleural thickening bilaterally and old  granulomatous disease. Electrocardiogram showed sinus rhythm, nonspecific ST-  T abnormality.  The cardiac enzymes were negative.   CONDITION ON DISCHARGE:  Improved/stable.   PLAN:  1. If a bed is available today the patient will be transferred to [facility] for an intensive inpatient rehabilitation program.  She     will then receive home health with [facility] after her transfer there.  2. Continue with her current medications.  Any adjustment would need to be     discussed with Dr. Deborah Chalk and Dr. Donnie Aho.  3. She may continue weightbearing as tolerated in the left hip.   CURRENT MEDICATIONS AT DISCHARGE:  1. Lasix 20 mg one daily.  2. Avapro 150 mg one daily.  3. Prinivil 40 mg one daily.  4. Evista 60 mg one daily.  5. Trinsicon one capsule t.i.d.  6. Colace 100 mg one b.i.d.  7. Protonix 40 mg one daily.  8. Hygroton 50 mg one daily.  9. Cardura 8 mg one h.s.  10.      Tenormin 25 mg b.i.d. (being held).  11.      Norvasc 2.5 mg daily (being held).  12.      Reglan for nausea.  13.      Percocet for pain.  14.      Robaxin or Skelaxin as a muscle relaxant.   WOUND CARE:  She should have dry dressing p.r.n. to the left hip.    SPECIAL INSTRUCTIONS:  As she has some minimal serous drainage would  recommend Keflex 500 mg t.i.d. p.o. x5 days.     Dooley L. Cherlynn June.                 Ronnell Guadalajara, M.D.   DLU/MEDQ  D:  10/23/2002  T:  10/23/2002  Job:  [id number]   cc:   Lacretia Nicks. Ashley Royalty., M.D.  [address]  [place]  [place] [address]  Fax: [phone]

## 2010-12-22 ENCOUNTER — Other Ambulatory Visit: Payer: Self-pay | Admitting: Cardiology

## 2010-12-22 ENCOUNTER — Ambulatory Visit
Admission: RE | Admit: 2010-12-22 | Discharge: 2010-12-22 | Disposition: A | Discharge status: Home / Self Care | Payer: Medicare Other | Source: Ambulatory Visit | Attending: Cardiology | Admitting: Cardiology

## 2010-12-22 DIAGNOSIS — R0989 Other specified symptoms and signs involving the circulatory and respiratory systems: Secondary | ICD-10-CM

## 2010-12-22 DIAGNOSIS — R0609 Other forms of dyspnea: Secondary | ICD-10-CM

## 2010-12-23 ENCOUNTER — Emergency Department (HOSPITAL_COMMUNITY): Payer: Medicare Other

## 2010-12-23 ENCOUNTER — Inpatient Hospital Stay (HOSPITAL_COMMUNITY)
Admission: EM | Admit: 2010-12-23 | Discharge: 2010-12-31 | DRG: 377 | Disposition: A | Discharge status: Home / Self Care | Payer: Medicare Other | Attending: Family Medicine | Admitting: Family Medicine

## 2010-12-23 DIAGNOSIS — E785 Hyperlipidemia, unspecified: Secondary | ICD-10-CM | POA: Diagnosis present

## 2010-12-23 DIAGNOSIS — R5381 Other malaise: Secondary | ICD-10-CM | POA: Diagnosis present

## 2010-12-23 DIAGNOSIS — I509 Heart failure, unspecified: Secondary | ICD-10-CM | POA: Diagnosis present

## 2010-12-23 DIAGNOSIS — K648 Other hemorrhoids: Secondary | ICD-10-CM | POA: Diagnosis present

## 2010-12-23 DIAGNOSIS — D509 Iron deficiency anemia, unspecified: Secondary | ICD-10-CM | POA: Diagnosis present

## 2010-12-23 DIAGNOSIS — Z96649 Presence of unspecified artificial hip joint: Secondary | ICD-10-CM

## 2010-12-23 DIAGNOSIS — I872 Venous insufficiency (chronic) (peripheral): Secondary | ICD-10-CM | POA: Diagnosis present

## 2010-12-23 DIAGNOSIS — K644 Residual hemorrhoidal skin tags: Secondary | ICD-10-CM | POA: Diagnosis present

## 2010-12-23 DIAGNOSIS — D62 Acute posthemorrhagic anemia: Secondary | ICD-10-CM | POA: Diagnosis not present

## 2010-12-23 DIAGNOSIS — K5730 Diverticulosis of large intestine without perforation or abscess without bleeding: Secondary | ICD-10-CM | POA: Diagnosis present

## 2010-12-23 DIAGNOSIS — Z7901 Long term (current) use of anticoagulants: Secondary | ICD-10-CM

## 2010-12-23 DIAGNOSIS — M109 Gout, unspecified: Secondary | ICD-10-CM | POA: Diagnosis present

## 2010-12-23 DIAGNOSIS — I10 Essential (primary) hypertension: Secondary | ICD-10-CM | POA: Diagnosis present

## 2010-12-23 DIAGNOSIS — N179 Acute kidney failure, unspecified: Secondary | ICD-10-CM | POA: Diagnosis present

## 2010-12-23 DIAGNOSIS — I4892 Unspecified atrial flutter: Secondary | ICD-10-CM | POA: Diagnosis present

## 2010-12-23 DIAGNOSIS — I5043 Acute on chronic combined systolic (congestive) and diastolic (congestive) heart failure: Secondary | ICD-10-CM | POA: Diagnosis not present

## 2010-12-23 DIAGNOSIS — Z86718 Personal history of other venous thrombosis and embolism: Secondary | ICD-10-CM

## 2010-12-23 DIAGNOSIS — K922 Gastrointestinal hemorrhage, unspecified: Principal | ICD-10-CM | POA: Diagnosis present

## 2010-12-23 DIAGNOSIS — K5900 Constipation, unspecified: Secondary | ICD-10-CM | POA: Diagnosis present

## 2010-12-23 DIAGNOSIS — I4891 Unspecified atrial fibrillation: Secondary | ICD-10-CM | POA: Diagnosis present

## 2010-12-23 DIAGNOSIS — K6389 Other specified diseases of intestine: Secondary | ICD-10-CM | POA: Diagnosis present

## 2010-12-23 DIAGNOSIS — Z7982 Long term (current) use of aspirin: Secondary | ICD-10-CM

## 2010-12-23 LAB — CBC
HCT: 23.1 % — ABNORMAL LOW (ref 36.0–46.0)
MCHC: 30.3 g/dL (ref 30.0–36.0)
RDW: 16.1 % — ABNORMAL HIGH (ref 11.5–15.5)

## 2010-12-23 LAB — DIFFERENTIAL
Basophils Absolute: 0 10*3/uL (ref 0.0–0.1)
Basophils Relative: 1 % (ref 0–1)
Eosinophils Relative: 7 % — ABNORMAL HIGH (ref 0–5)
Monocytes Absolute: 0.7 10*3/uL (ref 0.1–1.0)

## 2010-12-23 LAB — CK TOTAL AND CKMB (NOT AT ARMC)
CK, MB: 2 ng/mL (ref 0.3–4.0)
Relative Index: INVALID (ref 0.0–2.5)
Total CK: 87 U/L (ref 7–177)

## 2010-12-23 LAB — COMPREHENSIVE METABOLIC PANEL
Alkaline Phosphatase: 133 U/L — ABNORMAL HIGH (ref 39–117)
BUN: 42 mg/dL — ABNORMAL HIGH (ref 6–23)
GFR calc non Af Amer: 33 mL/min — ABNORMAL LOW (ref >60)
Glucose, Bld: 92 mg/dL (ref 70–99)
Potassium: 4.3 mEq/L (ref 3.5–5.1)
Total Protein: 6.7 g/dL (ref 6.0–8.3)

## 2010-12-23 LAB — PRO B NATRIURETIC PEPTIDE: Pro B Natriuretic peptide (BNP): 1591 pg/mL — ABNORMAL HIGH (ref 0–450)

## 2010-12-23 LAB — PROTIME-INR
INR: 2.25 — ABNORMAL HIGH (ref 0.00–1.49)
Prothrombin Time: 25 seconds — ABNORMAL HIGH (ref 11.6–15.2)

## 2010-12-23 LAB — CARDIAC PANEL(CRET KIN+CKTOT+MB+TROPI): Troponin I: <0.3 ng/mL (ref <0.30)

## 2010-12-23 NOTE — H&P (Signed)
NAMEHENRY, DEMERITT NO.:  [id number]  MEDICAL RECORD NO.:  [id number]           PATIENT TYPE:  E  LOCATION:  [facility]                         FACILITY:  [facility]  PHYSICIAN:  Talmage Nap, MD  DATE OF BIRTH:  [birth year]  DATE OF ADMISSION:  12/23/2010 DATE OF DISCHARGE:                             HISTORY & PHYSICAL   PRIMARY CARDIOLOGIST:  [name], M.D.  History obtainable from the patient and the patient's daughter.  CHIEF COMPLAINT:  Shortness of breath on and off for about a month duration.  HISTORY OF PRESENT ILLNESS:  The patient is an 77 year old very pleasant Caucasian female with history of atrial fibrillation and pulmonary embolism, on chronic anticoagulation with Coumadin as well as congestive heart failure (chronic diastolic dysfunction) was sent over from primary care provider's office,with complains of shortness of breath and a low hemoglobin count.  The patient claimed that for the past one month, she has been short of breath on and off and this got progressively worse 4 days prior to presenting to the emergency room. The patient claimed she gets short of breath walking a very short distance i.e less than half a block.She denied any associated chest pain. She denied any diaphoresis.  She denied any nausea or vomiting.  No fever.  No chills.  No rigor.  The shortness of breath with minimal exertion was said to be getting progressively worse hence the patient saw her primary care provider today who after evaluating the patient did lab work and the patient was found to have a hemoglobin of 6.0 g and subsequently asked to come to the hospital for further evaluation and blood transfusion.  PAST MEDICAL HISTORY: 1. Chronic dyspnea. 2. Atrial fibrillation. 3. Congestive heart failure (chronic diastolic dysfunction). 4. Hypertensive heart disease. 5. Hyperlipidemia. 6. Chronic anticoagulation with Coumadin. 7. Chronic venous  stasis. 8. Pulmonary embolism.  PAST SURGICAL HISTORY: 1. Colon CA status post exploratory laparotomy with resection. 2. Bilateral hip replacement. 3. Bilateral cataract surgery. 4. Hernia repair.  PREADMISSION MEDICATIONS: 1. Warfarin 2.5 mg as directed. 2. Tramadol 50 mg p.o. p.r.n. 3. Omeprazole 20 mg extended release one p.o. daily. 4. Low-dose aspirin 81 mg one p.o. daily chewable. 5. Bumetanide 2 mg one p.o. daily. 6. Avapro 300 mg half a tablet p.o. daily. 7. Singulair 10 mg one p.o. at bedtime. 8. Doxazosin 4 mg one p.o. at bedtime. 9. Symbicort  80/4.5 inhaled b.i.d. 10.Multivitamin 1 tablet p.o. daily. 11.Pravastatin 20 mg one p.o. daily. 12.Vitamin D 1000 units 1 capsule p.o. daily. 13.Diltiazem HCL 180 mg extended release one p.o. daily. 14.Amiodarone 200 mg one p.o. Monday, Wednesday, and Friday. 15.Ocuvite lutein capsule one p.o. daily. 16.Cefuroxime axetil 400 mg one p.o. b.i.d.  ALLERGIES:  CLONIDINE, LISINOPRIL and PRIMIDONE.  SOCIAL HISTORY:  Negative for alcohol, tobacco use.  The patient lives at home with her daughter and she is ambulatory and does her activities of daily living without any discomfort.  FAMILY HISTORY:  York Spaniel to be positive for coronary artery disease.  REVIEW OF SYSTEMS:  The patient denies any history of headaches.  No blurred vision.  No nausea or vomiting.  Complained of  shortness of breath on minimal exertion.  She denies any associated chest pain.  She denies any PND or orthopnea.  She denied any fever.  No chills.  No rigor.  No cough.  No abdominal discomfort.  No diarrhea or hematochezia.  No melanotic stool.  No dysuria or hematuria.  Periodic swelling of the lower extremity.  No intolerance to heat or cold.  No neuropsychiatric disorder.  PHYSICAL EXAMINATION:  GENERAL:  A very pleasant elderly lady, well- hydrated, not in any obvious respiratory distress, pale. VITAL SIGNS:  At present, blood pressure is 155/61, pulse is  71, respiratory rate 18, temperature is 98.1. HEENT: Pallor.  Pupils are reactive to light and extraocular muscles are intact. NECK:  No jugular venous distention.  No carotid bruit.  No lymphadenopathy. CHEST:  Clear to auscultation.  Heart sounds are 1 and 2. ABDOMEN:  Soft, nontender.  Liver and spleen tip not palpable.  Bowel sounds are positive. EXTREMITIES:  No pedal edema. NEUROLOGIC:  Exam is nonfocal. MUSCULOSKELETAL:  Unremarkable. SKIN:  Normal turgor. NEUROPSYCHIATRIC:  Evaluation is unremarkable.  LABORATORY DATA:  Baseline hematological indices showed WBC of 6.6, hemoglobin of 7.0, hematocrit of 23.1, MCV of 76.7, platelet count of 252 with normal differential.  Coagulation profile showed PT 25.0, INR 2.25, and a PTT of 35.  EKG showed sinus rhythm with a rate of 64 with PVCs and Q-wave.  IMPRESSIONS: 1. Shortness of breath, questionable secondary to a microcytic anemia. 2. Atrial fibrillation. 3. History of congestive heart failure (chronic diastolic     dysfunction). 4. Hypertension. 5. Hyperlipidemia. 6. History of chronic venous stasis. 7. History of pulmonary embolism.  PLAN:  Admit the patient to telemetry.  The patient will be on O2 via nasal cannula 3 liters per minute, aspirin 81 mg p.o. daily.  She will be typed and crossed, transfused with 2 units of packed RBCs and Lasix 40 mg IV after first unit.  Other medication to be given to the patient include a bumetanide 2 mg p.o. daily, Avapro 150 mg p.o. daily, Singulair 10 mg daily, doxazosin 4 mg p.o. daily, pravastatin 20 mg p.o. daily, diltiazem ER 180 mg p.o. daily, and also amiodarone 20 mg p.o. on Monday, Wednesday, and Friday.  She also will be on Ocuvite luetin 1 capsule p.o. daily and Coumadin p.o. and dosing to be done by pharmacy and GI prophylaxis with Protonix 40 mg p.o. daily.  Further labs to be ordered on this patient will include CMP stat, cardiac enzymes q.6 times three, and D-dimer  stat, pro-BNP stat, and fecal occult blood test, chest x-ray stat. CBCD, CMP and magnesium will be repeated in a.m.  The patient will also have PT, PTT, INR done in the a.m.  She will also have a 2-D echo done in this admission.  The patient will be followed and evaluated on day-to-day basis.     Talmage Nap, MD     CN/MEDQ  D:  12/23/2010  T:  12/23/2010  Job:  [id number]  Electronically Signed by Talmage Nap  on 12/23/2010 05:44:51 PM

## 2010-12-24 LAB — DIFFERENTIAL
Basophils Absolute: 0.1 10*3/uL (ref 0.0–0.1)
Lymphocytes Relative: 21 % (ref 12–46)
Monocytes Absolute: 0.8 10*3/uL (ref 0.1–1.0)
Monocytes Relative: 12 % (ref 3–12)
Neutro Abs: 3.9 10*3/uL (ref 1.7–7.7)
Neutrophils Relative %: 60 % (ref 43–77)

## 2010-12-24 LAB — CARDIAC PANEL(CRET KIN+CKTOT+MB+TROPI)
Relative Index: INVALID (ref 0.0–2.5)
Total CK: 78 U/L (ref 7–177)
Troponin I: <0.3 ng/mL (ref <0.30)

## 2010-12-24 LAB — FERRITIN: Ferritin: 22 ng/mL (ref 10–291)

## 2010-12-24 LAB — APTT: aPTT: 34 seconds (ref 24–37)

## 2010-12-24 LAB — CBC
HCT: 26.1 % — ABNORMAL LOW (ref 36.0–46.0)
Hemoglobin: 8.1 g/dL — ABNORMAL LOW (ref 12.0–15.0)
RBC: 3.31 MIL/uL — ABNORMAL LOW (ref 3.87–5.11)

## 2010-12-24 LAB — PROTIME-INR
INR: 1.97 — ABNORMAL HIGH (ref 0.00–1.49)
Prothrombin Time: 22.6 seconds — ABNORMAL HIGH (ref 11.6–15.2)

## 2010-12-24 LAB — COMPREHENSIVE METABOLIC PANEL
ALT: 11 U/L (ref 0–35)
BUN: 36 mg/dL — ABNORMAL HIGH (ref 6–23)
CO2: 31 mEq/L (ref 19–32)
Calcium: 8.7 mg/dL (ref 8.4–10.5)
Creatinine, Ser: 1.51 mg/dL — ABNORMAL HIGH (ref 0.4–1.2)
GFR calc non Af Amer: 33 mL/min — ABNORMAL LOW (ref >60)
Glucose, Bld: 88 mg/dL (ref 70–99)
Total Protein: 6.1 g/dL (ref 6.0–8.3)

## 2010-12-24 LAB — FOLATE: Folate: >20 ng/mL

## 2010-12-24 LAB — IRON AND TIBC: UIBC: 390 ug/dL

## 2010-12-24 LAB — MAGNESIUM: Magnesium: 2.1 mg/dL (ref 1.5–2.5)

## 2010-12-24 LAB — VITAMIN B12: Vitamin B-12: 888 pg/mL (ref 211–911)

## 2010-12-25 LAB — DIFFERENTIAL
Basophils Relative: 1 % (ref 0–1)
Lymphocytes Relative: 22 % (ref 12–46)
Monocytes Relative: 13 % — ABNORMAL HIGH (ref 3–12)
Neutro Abs: 4.1 10*3/uL (ref 1.7–7.7)
Neutrophils Relative %: 59 % (ref 43–77)

## 2010-12-25 LAB — COMPREHENSIVE METABOLIC PANEL
ALT: 11 U/L (ref 0–35)
AST: 17 U/L (ref 0–37)
CO2: 33 mEq/L — ABNORMAL HIGH (ref 19–32)
Chloride: 101 mEq/L (ref 96–112)
Creatinine, Ser: 1.62 mg/dL — ABNORMAL HIGH (ref 0.4–1.2)
GFR calc Af Amer: 36 mL/min — ABNORMAL LOW (ref >60)
GFR calc non Af Amer: 30 mL/min — ABNORMAL LOW (ref >60)
Sodium: 141 mEq/L (ref 135–145)
Total Bilirubin: 0.5 mg/dL (ref 0.3–1.2)

## 2010-12-25 LAB — CBC
HCT: 27.4 % — ABNORMAL LOW (ref 36.0–46.0)
Hemoglobin: 8.4 g/dL — ABNORMAL LOW (ref 12.0–15.0)
MCH: 24.5 pg — ABNORMAL LOW (ref 26.0–34.0)
RBC: 3.43 MIL/uL — ABNORMAL LOW (ref 3.87–5.11)

## 2010-12-26 LAB — MAGNESIUM: Magnesium: 2.2 mg/dL (ref 1.5–2.5)

## 2010-12-26 LAB — COMPREHENSIVE METABOLIC PANEL
ALT: 10 U/L (ref 0–35)
CO2: 30 mEq/L (ref 19–32)
Calcium: 8.3 mg/dL — ABNORMAL LOW (ref 8.4–10.5)
Creatinine, Ser: 1.98 mg/dL — ABNORMAL HIGH (ref 0.4–1.2)
GFR calc non Af Amer: 24 mL/min — ABNORMAL LOW (ref >60)
Glucose, Bld: 102 mg/dL — ABNORMAL HIGH (ref 70–99)
Sodium: 142 mEq/L (ref 135–145)

## 2010-12-26 LAB — DIFFERENTIAL
Basophils Relative: 1 % (ref 0–1)
Eosinophils Absolute: 0.5 10*3/uL (ref 0.0–0.7)
Eosinophils Relative: 6 % — ABNORMAL HIGH (ref 0–5)
Monocytes Absolute: 1.1 10*3/uL — ABNORMAL HIGH (ref 0.1–1.0)
Monocytes Relative: 14 % — ABNORMAL HIGH (ref 3–12)

## 2010-12-26 LAB — CBC
MCH: 24.2 pg — ABNORMAL LOW (ref 26.0–34.0)
MCHC: 30.4 g/dL (ref 30.0–36.0)
Platelets: 210 10*3/uL (ref 150–400)

## 2010-12-26 LAB — PROTIME-INR: Prothrombin Time: 23.1 seconds — ABNORMAL HIGH (ref 11.6–15.2)

## 2010-12-27 ENCOUNTER — Inpatient Hospital Stay (HOSPITAL_COMMUNITY): Payer: Medicare Other

## 2010-12-27 LAB — CBC
Hemoglobin: 8 g/dL — ABNORMAL LOW (ref 12.0–15.0)
MCH: 24.3 pg — ABNORMAL LOW (ref 26.0–34.0)
MCHC: 30.3 g/dL (ref 30.0–36.0)
MCV: 80.2 fL (ref 78.0–100.0)
WBC: 8 10*3/uL (ref 4.0–10.5)

## 2010-12-27 LAB — COMPREHENSIVE METABOLIC PANEL
ALT: 10 U/L (ref 0–35)
Alkaline Phosphatase: 118 U/L — ABNORMAL HIGH (ref 39–117)
CO2: 29 mEq/L (ref 19–32)
GFR calc non Af Amer: 17 mL/min — ABNORMAL LOW (ref >60)
Glucose, Bld: 102 mg/dL — ABNORMAL HIGH (ref 70–99)
Potassium: 4.4 mEq/L (ref 3.5–5.1)
Sodium: 141 mEq/L (ref 135–145)

## 2010-12-27 LAB — TYPE AND SCREEN
ABO/RH(D): O POS
Antibody Screen: NEGATIVE
Unit division: 0

## 2010-12-27 LAB — DIFFERENTIAL
Basophils Absolute: 0.1 10*3/uL (ref 0.0–0.1)
Eosinophils Relative: 7 % — ABNORMAL HIGH (ref 0–5)
Lymphocytes Relative: 17 % (ref 12–46)

## 2010-12-27 LAB — MAGNESIUM: Magnesium: 2.1 mg/dL (ref 1.5–2.5)

## 2010-12-27 NOTE — Discharge Summary (Signed)
[name], PLATTE NO.:  [id number]  MEDICAL RECORD NO.:  [id number]           PATIENT TYPE:  I  LOCATION:  3733                         FACILITY:  [facility]  PHYSICIAN:  Talmage Nap, MD  DATE OF BIRTH:  September 26, 1921  DATE OF ADMISSION:  12/23/2010 DATE OF DISCHARGE:                          DISCHARGE SUMMARY - REFERRING   DISCHARGING DOCTOR:  Talmage Nap, MD  PRIMARY CARDIOLOGIST:  [name], MD  CONSULTANTS INVOLVED IN THE CASE: 1. GI, Petra Kuba, MD 2. Cardiology.  DISCHARGE DIAGNOSES: 1. Shortness of breath, most likely secondary to microcytic anemia     status post packed red blood cells transfusion status post     esophagogastroduodenoscopy, finding of esophagogastroduodenoscopy     normal status post colonoscopy.  Findings on colonoscopy:     a.     Internal/external small hemorrhoids.     b.     Diverticulosis, left greater than right.     c.     Sigmoid colon polyp.     d.     Oozing punctate lesion in proximal transverse colon status      post BICAP. 2. Atrial fibrillation. 3. Congestive heart failure (systolic dysfunction). 4. Hypertension. 5. Hyperlipidemia. 6. Chronic venous stasis. 7. Arthritis. 8. History of pulmonary embolism. 9. Renal insufficiency. 10.Pulmonary hypertension.  The patient is a very pleasant 77 year old Caucasian female with history of atrial fibrillation, on anticoagulation with Coumadin, and congestive heart failure (systolic dysfunction) was sent over from her primary provider office with complaints of shortness of breath and low hemoglobin.  The patient claimed that for the past 1 month she had been having on and off shortness of breath and this was said to be getting progressively worse.  She however denied any history of chest pain.  She denied any diaphoresis.  She denied any nausea or vomiting.  She also denied any systemic symptoms, i.e., no fever, no chills, no rigor.  In the PCP's  office, baseline H and H  6.0 g and subsequently, the patient was asked to come to the hospital for further evaluation and for blood transfusion.  PREADMISSION MEDICATIONS: 1. Warfarin 2.5 mg as directed. 2. Tramadol 50 mg p.o. p.r.n. 3. Omeprazole 20 mg extended release one p.o. daily. 4. Low-dose aspirin 81 mg p.o. daily chewable. 5. Bumetanide 2 mg one p.o. daily. 6. Avapro 300 mg half a tablet p.o. daily. 7. Singulair 10 mg one p.o. daily at bedtime. 8. Terazosin 1 mg one p.o. at bedtime. 9. Symbicort 80/4.5 inhaled b.i.d. 10.Multivitamin one p.o. daily. 11.Pravastatin 20 mg one p.o. daily. 12.Vitamin D 1000 units one capsule daily. 13.Diltiazem HCl 180 mg extended release one p.o. daily. 14.Amiodarone 200 mg one p.o. on Monday, Wednesday, and Friday. 15.Ocuvite Lutein one capsule p.o. daily. 16.Cefuroxime axetil 500 mg one p.o. daily.  ALLERGIES:  CLONIDINE, LISINOPRIL, and PRIMIDONE.  PAST SURGICAL HISTORY: 1. Exploratory laparotomy with colon resection with end-to-end     anastomosis. 2. Bilateral hip replacement. 3. Bilateral cataract surgery. 4. Hernia repair.  SOCIAL HISTORY:  Negative for alcohol, tobacco use.  The patient lives at home with her daughter, and she is ambulatory and does  activities of daily living without any discomfort.  FAMILY HISTORY:  York Spaniel to be positive for coronary artery disease.  REVIEW OF SYSTEMS:  Essentially as documented in initial history and physical.  At the time the patient was seen by me, she was well hydrated, not in any acute respiratory distress.  She was, however, pale.  PHYSICAL EXAMINATION:  VITAL SIGNS:  Blood pressure is 155/61, pulse is 71, respiratory rate 18, temperature is 98.1. HEENT:  Pallor but pupils are reactive to light and extraocular muscles intact. NECK:  She had no jugular venous distention.  No carotid bruit.  No lymphadenopathy. CHEST:  Clear to auscultation. HEART:  Sounds are 1 and 2 with soft  systolic murmur. ABDOMEN:  Soft, nontender.  Liver, spleen, kidney not palpable.  Bowel sounds are positive. EXTREMITIES:  No pedal edema. NEUROLOGIC:  Nonfocal. MUSCULOSKELETAL:  Arthritic changes in the knees and feet. NEUROPSYCHIATRIC:  Unremarkable. SKIN:  Normal turgor.  LABORATORY DATA:  A repeat complete blood count with differential done in the emergency room showed WBC of 6.6, hemoglobin of 7.0, hematocrit of 23.1, MCV of 26.7, with a platelet count of 252.  Coagulation profile showed PT 25.0, INR 2.25, and aPTT of 35.  Comprehensive metabolic panel showed sodium of 140, potassium of 4.3, chloride of 102 with a bicarb of 28, glucose is 92, BUN is 42, creatinine is 1.50.  D-dimer is 0.53. Cardiac markers:  Troponin I less than 0.30, less than 0.30, and less than 0.30 respectively and CK-MB within normal range.  ProBNP is 1591. Thyroid panel:  T4 of 1.81, T3 of 2.5.  Fecal occult blood test positive.  Anemia panel showed serum iron 24, total iron binding capacity 414, percent saturation is 6, UIBC 390, and vitamin B12 is 888, ferritin level is 22.  A repeat comprehensive metabolic panel done on December 27, 2010, showed sodium of 141, potassium of 4.4, chloride of 102 with a bicarb of 29, glucose is 102, BUN is 47, creatinine is 2.62, magnesium level is 2.1.  ProBNP is 1463.  Coagulation profile showed PT 23.5, INR 2.08.  IMAGING STUDIES DONE ON THE PATIENT: 1. Chest x-ray which showed cardiomegaly with interval increase in     congestive heart failure and a repeat chest x-ray done on December 27, 2010, showed no active lung disease. 2. A 2-D echo done in January 2011 showed severe LVH, EF of 45-50%,     wall motion was normal, and PA pressure of 47 mmHg.  HOSPITAL COURSE:  The patient was admitted to telemetry.  She was typed and crossed and transfused with 2 units of packed RBCs and given Lasix 40 mg IV after first unit.  She was also placed on O2 via nasal cannula at 3 liters  per minute.  Other medications given to the patient include: 1. Bumetanide 2 mg p.o. daily. 2. Avapro 150 mg p.o. daily. 3. Singulair 10 mg p.o. daily. 4. Terazosin 5 mg p.o. daily. 5. Pravastatin 20 mg p.o. daily. 6. Diltiazem ER 180 mg p.o. daily. 7. Amiodarone 200 mg p.o. on Monday, Wednesday, and Friday. 8. Ocuvite Lutein one cap p.o. daily. GI prophylaxis was with Protonix 40 mg p.o. daily, however, because of the history of AFib, Coumadin dosing was continued and this was done by Pharmacy.  The patient was evaluated by the cardiologist and subsequently Lasix IV was added to the patient's regimen.  She was also evaluated by gastroenterologist, Dr. Ewing Schlein.  The patient had EGD done which  was normal and also a colonoscopy and the findings are as described above. So far, the patient has remained clinically stable.  She denied any chest pain or shortness of breath.  No systemic symptoms, i.e., no fever, no chills, no rigor.  She was evaluated by me today, which is December 27, 2010, complained about knee pain but denies any fever, chills, or rigor.  Examination of the patient was essentially unremarkable.  Her vital signs, blood pressure is 118/56, pulse 60, respiratory rate 18, temperature is 98.1, medically stable.  Plan is for the patient to be discharged home today on activity as tolerated.  Low-sodium, low- cholesterol diet, daily weight, fluid restriction.  She was advised to watch out for signs and symptoms of congestive heart failure which include congestion in the lungs and shortness of breath, swelling of the lower extremity.  Should she develop these symptoms, she was advised to come to the hospital to be evaluated.  Because of the colonoscopy finding, Coumadin will be discontinued.  MEDICATIONS TO BE TAKEN AT HOME: 1. Ferrous sulfate 325 mg one p.o. b.i.d. with meals. 2. Tramadol 50 mg one p.o. q.4 h. p.r.n. 3. Amiodarone 200 mg one p.o. on Monday, Wednesday, and Friday. 4.  The patient will continue aspirin 81 mg one p.o. daily. 5. Avapro (irbesartan) 300 mg half a tablet p.o. q.a.m. 6. Bumetanide 2 mg one p.o. daily. 7. Diltiazem CD 180 mg one p.o. daily. 8. Terazosin 5 mg one p.o. daily at bedtime. 9. Multivitamin over-the-counter one p.o. daily. 10.Omeprazole 20 mg one p.o. daily. 11.Pravachol (pravastatin) 20 mg one p.o. nightly. 12.Singulair (montelukast) 10 mg p.o. daily. 13.Symbicort (budesonide and formoterol) 2 puffs b.i.d. p.r.n. 14.Vitamin D 1000 units 1 capsule p.o. daily.  A copy of this summary will be made available to the patient's primary care physician.     Talmage Nap, MD     CN/MEDQ  D:  12/27/2010  T:  12/27/2010  Job:  [id number]  Electronically Signed by Talmage Nap  on 12/27/2010 05:49:08 PM

## 2010-12-28 LAB — DIFFERENTIAL
Basophils Absolute: 0 10*3/uL (ref 0.0–0.1)
Basophils Relative: 0 % (ref 0–1)
Lymphs Abs: 1.4 10*3/uL (ref 0.7–4.0)
Monocytes Relative: 12 % (ref 3–12)
Neutro Abs: 6.7 10*3/uL (ref 1.7–7.7)

## 2010-12-28 LAB — CBC
HCT: 25.5 % — ABNORMAL LOW (ref 36.0–46.0)
Hemoglobin: 7.8 g/dL — ABNORMAL LOW (ref 12.0–15.0)
MCH: 24.5 pg — ABNORMAL LOW (ref 26.0–34.0)
MCV: 79.9 fL (ref 78.0–100.0)
Platelets: 197 10*3/uL (ref 150–400)
RBC: 3.19 MIL/uL — ABNORMAL LOW (ref 3.87–5.11)

## 2010-12-28 LAB — BASIC METABOLIC PANEL
CO2: 30 mEq/L (ref 19–32)
Calcium: 8.3 mg/dL — ABNORMAL LOW (ref 8.4–10.5)
Chloride: 95 mEq/L — ABNORMAL LOW (ref 96–112)
Glucose, Bld: 108 mg/dL — ABNORMAL HIGH (ref 70–99)
Sodium: 135 mEq/L (ref 135–145)

## 2010-12-29 LAB — DIFFERENTIAL
Basophils Relative: 0 % (ref 0–1)
Eosinophils Absolute: 0 10*3/uL (ref 0.0–0.7)
Eosinophils Relative: 0 % (ref 0–5)
Lymphs Abs: 1 10*3/uL (ref 0.7–4.0)
Monocytes Absolute: 1.2 10*3/uL — ABNORMAL HIGH (ref 0.1–1.0)
Monocytes Relative: 11 % (ref 3–12)

## 2010-12-29 LAB — COMPREHENSIVE METABOLIC PANEL
ALT: 10 U/L (ref 0–35)
Alkaline Phosphatase: 105 U/L (ref 39–117)
BUN: 60 mg/dL — ABNORMAL HIGH (ref 6–23)
CO2: 30 mEq/L (ref 19–32)
Calcium: 8.1 mg/dL — ABNORMAL LOW (ref 8.4–10.5)
GFR calc non Af Amer: 20 mL/min — ABNORMAL LOW (ref >60)
Glucose, Bld: 121 mg/dL — ABNORMAL HIGH (ref 70–99)
Sodium: 135 mEq/L (ref 135–145)
Total Protein: 6.1 g/dL (ref 6.0–8.3)

## 2010-12-29 LAB — CBC
MCH: 25.2 pg — ABNORMAL LOW (ref 26.0–34.0)
MCHC: 31.8 g/dL (ref 30.0–36.0)
MCV: 79.2 fL (ref 78.0–100.0)
Platelets: 173 10*3/uL (ref 150–400)

## 2010-12-29 LAB — PROTIME-INR: Prothrombin Time: 17.5 seconds — ABNORMAL HIGH (ref 11.6–15.2)

## 2010-12-29 LAB — CROSSMATCH
Antibody Screen: NEGATIVE
Unit division: 0

## 2010-12-29 LAB — MAGNESIUM: Magnesium: 2.2 mg/dL (ref 1.5–2.5)

## 2010-12-30 LAB — COMPREHENSIVE METABOLIC PANEL
AST: 14 U/L (ref 0–37)
Albumin: 2.7 g/dL — ABNORMAL LOW (ref 3.5–5.2)
BUN: 67 mg/dL — ABNORMAL HIGH (ref 6–23)
Calcium: 8.5 mg/dL (ref 8.4–10.5)
Creatinine, Ser: 2.07 mg/dL — ABNORMAL HIGH (ref 0.4–1.2)
GFR calc Af Amer: 27 mL/min — ABNORMAL LOW (ref >60)
Total Bilirubin: 0.3 mg/dL (ref 0.3–1.2)
Total Protein: 6.1 g/dL (ref 6.0–8.3)

## 2010-12-30 LAB — CBC
Platelets: 196 10*3/uL (ref 150–400)
RBC: 3.51 MIL/uL — ABNORMAL LOW (ref 3.87–5.11)
RDW: 17.6 % — ABNORMAL HIGH (ref 11.5–15.5)
WBC: 10 10*3/uL (ref 4.0–10.5)

## 2010-12-30 LAB — DIFFERENTIAL
Basophils Absolute: 0 10*3/uL (ref 0.0–0.1)
Eosinophils Absolute: 0 10*3/uL (ref 0.0–0.7)
Eosinophils Relative: 0 % (ref 0–5)
Lymphocytes Relative: 10 % — ABNORMAL LOW (ref 12–46)
Lymphs Abs: 1 10*3/uL (ref 0.7–4.0)
Neutrophils Relative %: 82 % — ABNORMAL HIGH (ref 43–77)

## 2010-12-30 LAB — PROTIME-INR
INR: 1.36 (ref 0.00–1.49)
Prothrombin Time: 17 seconds — ABNORMAL HIGH (ref 11.6–15.2)

## 2010-12-30 LAB — GLUCOSE, CAPILLARY: Glucose-Capillary: 112 mg/dL — ABNORMAL HIGH (ref 70–99)

## 2010-12-31 LAB — CBC
HCT: 31.3 % — ABNORMAL LOW (ref 36.0–46.0)
Hemoglobin: 9.5 g/dL — ABNORMAL LOW (ref 12.0–15.0)
MCH: 24.6 pg — ABNORMAL LOW (ref 26.0–34.0)
MCV: 81.1 fL (ref 78.0–100.0)
Platelets: 222 10*3/uL (ref 150–400)
RBC: 3.86 MIL/uL — ABNORMAL LOW (ref 3.87–5.11)
RDW: 17.9 % — ABNORMAL HIGH (ref 11.5–15.5)
WBC: 9.6 10*3/uL (ref 4.0–10.5)

## 2010-12-31 LAB — PROTIME-INR
INR: 1.23 (ref 0.00–1.49)
Prothrombin Time: 15.7 seconds — ABNORMAL HIGH (ref 11.6–15.2)

## 2010-12-31 LAB — BASIC METABOLIC PANEL
BUN: 65 mg/dL — ABNORMAL HIGH (ref 6–23)
Chloride: 94 mEq/L — ABNORMAL LOW (ref 96–112)
Creatinine, Ser: 1.9 mg/dL — ABNORMAL HIGH (ref 0.4–1.2)
GFR calc non Af Amer: 25 mL/min — ABNORMAL LOW (ref >60)
Glucose, Bld: 152 mg/dL — ABNORMAL HIGH (ref 70–99)

## 2011-01-01 NOTE — Discharge Summary (Signed)
  [name], BOWLER NO.:  [id number]  MEDICAL RECORD NO.:  [id number]  LOCATION:  3733                         FACILITY:  [facility]  PHYSICIAN:  Standley Dakins, MD   DATE OF BIRTH:  1921/12/16  DATE OF ADMISSION:  12/23/2010 DATE OF DISCHARGE:  12/31/2010                              DISCHARGE SUMMARY   ADDENDUM:  Addendum for job number [id number].  CARDIOLOGIST:  Dr. Donnie Aho.  PRIMARY GASTROENTEROLOGIST:  Petra Kuba, M.D.  ADDENDUM:  The patient was initially going to be discharged home on Nov 28, 2010.  However, her discharge was held because she was having an attack of gouty arthritis in the feet.  We checked a uric acid level and it was elevated at that time.  The patient was monitored for an additional couple of days and also worked with physical therapy.  The patient improved after several days of treatments and began ambulating well without assistance.  She was seen by physical therapy and theyrecommended that she use a rolling walker at all times for gait stability.  The cardiologist followed her and recommended discontinuing the Avapro and Coumadin.  The patient will follow up with Cardiology in 2 weeks.  She will follow up with her primary care provider in 1 week. She also will follow up with her gastroenterologist as needed.  The patient was discharged in stable condition.  She will be discharged home with family members.  On the day of discharge the patient was awake, alert in no distress and cooperative.  She was up in a chair.  PHYSICAL EXAMINATION:  VITAL SIGNS:  Her vital signs were stable. Temperature 98.8, pulse 61, respirations 18, blood pressure 145/62.  The patient was in no distress.  Her pulse ox was 97% on 2 liters. GENERAL:  She was awake, alert and oriented. HEENT: Normocephalic, atraumatic.  Mucous membranes moist. NECK:  Supple.  Thyroid soft, no nodules or masses palpated. LUNGS:  Bilateral breath sounds clear to  auscultation. CARDIAC:  Normal S1, S2 sounds without murmurs, rubs, or gallops. ABDOMEN:  Soft, nondistended, nontender. EXTREMITIES:  No cyanosis or edema noted.  Her creatinine improved to 1.90 prior to discharge.  She had a normal white blood cell count and her hemoglobin prior to discharge was 9.5, hematocrit 31.3, platelet count 222.  I spent 37 minutes preparing this patient's discharge including reviewing the medical records, arranging follow-up care and dictation, writing prescriptions and reviewing consultation notes.     Standley Dakins, MD     CJ/MEDQ  D:  12/31/2010  T:  12/31/2010  Job:  [id number]  Electronically Signed by Standley Dakins  on 01/01/2011 06:23:18 PM

## 2011-02-06 NOTE — Consult Note (Signed)
NAMEGIANNA, [name] NO.:  [id number]  MEDICAL RECORD NO.:  [id number]           PATIENT TYPE:  I  LOCATION:  3733                         FACILITY:  [facility]  PHYSICIAN:  Petra Kuba, M.D.    DATE OF BIRTH:  1922/02/08  DATE OF CONSULTATION:  12/24/2010 DATE OF DISCHARGE:                                CONSULTATION   HISTORY:  The patient was seen at the request of the hospitalist for microcytic anemia.  She had no previous history of anemia but has been on Coumadin and on aspirin and probably omeprazole for awhile and although she has no GI symptoms, I am consulted for further workup and plans.  She had a colonoscopy and this happened in 1999 and has not had any GI procedures since.  She did require surgery.  Unfortunately those records are not available at this time.  It was done at [facility] based on a x-ray found in the computer at that time.  PAST MEDICAL HISTORY:  Pertinent for some heart problems followed by Dr. Donnie Aho including AFib and congestive heart failure.  She also has increased cholesterol, hypertension, chronic venous stasis and history of pulmonary embolus.  She has a colon resection as above as well as bilateral hip replacements, bilateral cataracts and a hernia repair.  FAMILY HISTORY:  Pertinent for a brother with colon cancer.  SOCIAL HISTORY:  She does not drink or smoke and does not use any extra arthritis pills.  ALLERGIES:  CLONIDINE, LISINOPRIL and PREMARIN.  CURRENT MEDICINES:  At home include recent antibiotics, eyedrops, amiodarone, diltiazem, vitamin D, pravastatin, multivitamins, Symbicort, doxazosin, Singulair, Avapro, Bumetanide, aspirin, omeprazole, tramadol and Coumadin.  REVIEW OF SYSTEMS:  Negative except above with 1 unit of transfusion. Her shortness of breath is better.  PHYSICAL EXAMINATION:  GENERAL:  No acute distress, sitting comfortably in the bed. VITAL SIGNS:  Stable, afebrile. ABDOMEN:  Soft,  nontender.  CBC on admission, pertinent for hemoglobin of 7 with normal white count and platelet count, MCV 76 and although one in the computer from a year ago had normal hemoglobin with a normal MCV.  PT of 2.25.  Complete metabolic profile pertinent for a BUN of 42, creatinine 1.5, normal liver tests.  Albumin 3.4.  With hydration, BUN and creatinine did not really change with a creatinine of 1.51.  ASSESSMENT: 1. Multiple medical problems. 2. History of colon perforation and surgery in 1999. 3. Family history of colon cancer. 4. Microcytic anemia, asymptomatic.  The patient on Coumadin and     aspirin.  PLAN:  The risks, benefits, and methods of an endoscopy was discussed with both the patient and her granddaughter and we will start with that procedure first.  If nondiagnostic, consider a barium enema, virtual colonoscopy or even a regular colonoscopy or a CT scan just to rule out anything big or bad pending all of those above and decide any further workup and plan.  In the meantime, we will hold Coumadin tonight and proceed in the a.m.  In the meantime try to get [facility] medical records to find me those records to review.  ______________________________ Petra Kuba, M.D.     MEM/MEDQ  D:  12/24/2010  T:  12/25/2010  Job:  [id number]  cc:   Petra Kuba, M.D. [name], M.D. Stacie Acres Cliffton Asters, M.D.  Electronically Signed by Vida Rigger M.D. on 02/06/2011 03:01:26 PM

## 2011-02-06 NOTE — Op Note (Signed)
  NAMELUNAH, [name]          ACCOUNT NO.:  [id number]  MEDICAL RECORD NO.:  [id number]           PATIENT TYPE:  I  LOCATION:  3733                         FACILITY:  [facility]  PHYSICIAN:  Petra Kuba, M.D.    DATE OF BIRTH:  03-Nov-1921  DATE OF PROCEDURE:  12/25/2010 DATE OF DISCHARGE:                              OPERATIVE REPORT   PROCEDURE:  EGD.  INDICATIONS:  Microcytic anemia.  The patient on aspirin and Coumadin. Consent was signed after risks, benefits, methods, options thoroughly discussed yesterday in the hospital and today prior to the sedation. Medicines used fentanyl 25 mcg, Versed 3 mg.  PROCEDURE:  The video endoscope was inserted by direct vision.  The esophagus was normal.  She did have a small to medium sized hiatal hernia.  Scope passed into the stomach.  No blood was seen and advanced through a normal antrum, normal pylorus into a normal duodenal bulb and around the C-loop to a normal second portion of the duodenum.  No blood was seen distally.  Scope was withdrawn back to the bulb and a good look there ruled out abnormalities in that location.  Scope was withdrawn back to the stomach and retroflexed.  __________ the hiatal hernia was confirmed.  The angularis, lesser greater curve, and fundus were normal on retroflex visualization and straight visualization of the stomach did not reveal any additional findings.  Air was suctioned.  Scope was slowly withdrawn and again a good look at the esophagus was normal except for some tortuosity and spasm.  Scope was removed.  The patient tolerated the procedure well.  There was no obvious immediate complication.  ENDOSCOPIC DIAGNOSES: 1. Small to medium sized hiatal hernia. 2. Otherwise within normal limits EGD without any blood being seen.  PLAN: 1. Continue pump inhibitors.  Ideally we would proceed with     colonoscopy. 2. If the patient agrees, I have reviewed the previous records and     believe it can  be done with reasonable risks, although barium enema     or virtual colonoscopy or a CT scan to rule out anything big and     bad could all be considered. 3. We will discuss with the patient and family when awake but go ahead     and put her on clear liquids in anticipation of probably     proceeding.         ______________________________ Petra Kuba, M.D.    MEM/MEDQ  D:  12/25/2010  T:  12/25/2010  Job:  [id number]  cc:   [name], M.D.  Electronically Signed by Vida Rigger M.D. on 02/06/2011 03:01:32 PM

## 2011-02-06 NOTE — Op Note (Signed)
NAMECHANNON, [name]          ACCOUNT NO.:  [id number]  MEDICAL RECORD NO.:  [id number]           PATIENT TYPE:  I  LOCATION:  3733                         FACILITY:  [facility]  PHYSICIAN:  Petra Kuba, M.D.    DATE OF BIRTH:  1922/02/08  DATE OF PROCEDURE:  12/26/2010 DATE OF DISCHARGE:                              OPERATIVE REPORT   PROCEDURE:  Colonoscopy with BICAP.  INDICATIONS:  Anemia, family history of colon cancer, nondiagnostic endoscopy.  Consent was signed after risks, benefits, methods, options thoroughly discussed multiple times during this hospital stay.  MEDICINES USED:  Fentanyl 25 mcg, Versed 3 mg.  PROCEDURE:  Rectal inspection is pertinent for external hemorrhoids small.  Digital exam was negative.  The pediatric video colonoscope was inserted and easily advanced to the anastomosis which seemed to be a small colon and the colon side anastomosis.  She did have a diverticula around the anastomosis and a tiny polyp which was not biopsied.  Based on this benign appearance, small size, and her probable need for Coumadin, we carefully advanced through the patent part of the anastomosis.  There were left-sided diverticula with a rare right-sided diverticula.  In approximate level of the proximal transverse and the hepatic flexure, a few clots and signs of fairly fresh blood were seen. Lots of washing and suctioning revealed a punctate oozing site.  We did advance to the cecum which was identified by the appendiceal orifice and the ileocecal valve but could not easily advance into the terminal ileum.  There was no blood seen in the cecum and then the proximal ascending, all the blood seen was in the hepatic flexure in the proximal transverse.  On slow withdrawal back to that area with lots of washing and suctioning no other abnormalities were seen.  The punctate lesion seen in the proximal transverse continued to ooze and after a prolonged observation with  washing and suctioning and ruling out any other right- sided lesions with multiple advances to the cecum, we elected to go ahead and BICAP this area which was done in the customary fashion with a nice white coagulum and excellent hemostasis.  Photo documentation was obtained.  The scope was slowly withdrawn.  The prep was adequate.  On slow withdrawal through the rest of the colon, the left greater than right diverticula were seen as well as the anastomotic findings mentioned above.  Scope was slowly withdrawn back to the rectum. Anorectal pull-through and retroflexion confirmed some small hemorrhoids.  Scope was straightened and readvanced to the anastomosis. Air was suctioned, scope removed.  The patient tolerated the procedure well.  There was no obvious immediate complication.  ENDOSCOPIC DIAGNOSES: 1. Internal-external small hemorrhoids. 2. Left greater than right diverticula. 3. Distal sigmoid anastomosis with a tiny polyp colon side to colon     end anastomosis, polyp not biopsied. 4. An oozing punctate lesion proximal transverse status post BICAP     with good coagulation. 5. Otherwise, within normal limits to the cecum.  PLAN:  See how she does.  Reevaluate her need for both aspirin and Coumadin per Cardiology.  In the meantime add iron.  Follow up with  me p.r.n. or in 1 month and consider checking a CAT scan just to be sure no other significant lesions.  We will advance diet as well.          ______________________________ Petra Kuba, M.D.     MEM/MEDQ  D:  12/26/2010  T:  12/26/2010  Job:  [id number]  cc:   [name], M.D. Stacie Acres Cliffton Asters, M.D.  Electronically Signed by Vida Rigger M.D. on 02/06/2011 03:01:34 PM

## 2011-04-29 LAB — CBC
HCT: 32.7 % — ABNORMAL LOW (ref 36.0–46.0)
HCT: 37.3 % (ref 36.0–46.0)
Hemoglobin: 10.9 g/dL — ABNORMAL LOW (ref 12.0–15.0)
Hemoglobin: 12.3 g/dL (ref 12.0–15.0)
MCHC: 33 g/dL (ref 30.0–36.0)
MCV: 94.3 fL (ref 78.0–100.0)
MCV: 94.8 fL (ref 78.0–100.0)
Platelets: 180 10*3/uL (ref 150–400)
Platelets: 257 10*3/uL (ref 150–400)
RDW: 13.8 % (ref 11.5–15.5)
WBC: 5.4 10*3/uL (ref 4.0–10.5)

## 2011-04-29 LAB — CARDIAC PANEL(CRET KIN+CKTOT+MB+TROPI)
CK, MB: 2.1 ng/mL (ref 0.3–4.0)
CK, MB: 2.1 ng/mL (ref 0.3–4.0)
CK, MB: 2.5 ng/mL (ref 0.3–4.0)
Total CK: 59 U/L (ref 7–177)
Total CK: 73 U/L (ref 7–177)
Total CK: 9 U/L (ref 7–177)
Troponin I: 0.09 ng/mL — ABNORMAL HIGH (ref 0.00–0.06)

## 2011-04-29 LAB — APTT
aPTT: 129 seconds — ABNORMAL HIGH (ref 24–37)
aPTT: 51 seconds — ABNORMAL HIGH (ref 24–37)

## 2011-04-29 LAB — CULTURE, BLOOD (ROUTINE X 2)

## 2011-04-29 LAB — GLUCOSE, CAPILLARY
Glucose-Capillary: 116 mg/dL — ABNORMAL HIGH (ref 70–99)
Glucose-Capillary: 171 mg/dL — ABNORMAL HIGH (ref 70–99)
Glucose-Capillary: 91 mg/dL (ref 70–99)

## 2011-04-29 LAB — COMPREHENSIVE METABOLIC PANEL
AST: 47 U/L — ABNORMAL HIGH (ref 0–37)
Albumin: 2.7 g/dL — ABNORMAL LOW (ref 3.5–5.2)
Calcium: 7.7 mg/dL — ABNORMAL LOW (ref 8.4–10.5)
Chloride: 107 mEq/L (ref 96–112)
Creatinine, Ser: 1.35 mg/dL — ABNORMAL HIGH (ref 0.4–1.2)
GFR calc Af Amer: 45 mL/min — ABNORMAL LOW (ref >60)
Total Bilirubin: 1.1 mg/dL (ref 0.3–1.2)
Total Protein: 5.6 g/dL — ABNORMAL LOW (ref 6.0–8.3)

## 2011-04-29 LAB — B-NATRIURETIC PEPTIDE (CONVERTED LAB)
Pro B Natriuretic peptide (BNP): 1214 pg/mL — ABNORMAL HIGH (ref 0.0–100.0)
Pro B Natriuretic peptide (BNP): 662 pg/mL — ABNORMAL HIGH (ref 0.0–100.0)
Pro B Natriuretic peptide (BNP): 858 pg/mL — ABNORMAL HIGH (ref 0.0–100.0)

## 2011-04-29 LAB — BASIC METABOLIC PANEL
BUN: 15 mg/dL (ref 6–23)
BUN: 17 mg/dL (ref 6–23)
CO2: 33 mEq/L — ABNORMAL HIGH (ref 19–32)
Chloride: 108 mEq/L (ref 96–112)
Chloride: 99 mEq/L (ref 96–112)
GFR calc non Af Amer: 45 mL/min — ABNORMAL LOW (ref >60)
Glucose, Bld: 101 mg/dL — ABNORMAL HIGH (ref 70–99)
Glucose, Bld: 90 mg/dL (ref 70–99)
Potassium: 4 mEq/L (ref 3.5–5.1)
Potassium: 4 mEq/L (ref 3.5–5.1)
Sodium: 144 mEq/L (ref 135–145)
Sodium: 145 mEq/L (ref 135–145)

## 2011-04-29 LAB — PROTIME-INR
INR: 1.2 (ref 0.00–1.49)
INR: 1.2 (ref 0.00–1.49)
Prothrombin Time: 15.6 seconds — ABNORMAL HIGH (ref 11.6–15.2)

## 2011-04-29 LAB — HEPARIN LEVEL (UNFRACTIONATED): Heparin Unfractionated: 0.53 IU/mL (ref 0.30–0.70)

## 2011-04-29 LAB — TSH: TSH: 1.396 u[IU]/mL (ref 0.350–4.500)

## 2011-04-29 LAB — LIPID PANEL: Cholesterol: 152 mg/dL (ref 0–200)

## 2011-04-30 LAB — BASIC METABOLIC PANEL
BUN: 22 mg/dL (ref 6–23)
BUN: 15 mg/dL (ref 6–23)
Chloride: 101 mEq/L (ref 96–112)
Chloride: 106 mEq/L (ref 96–112)
Creatinine, Ser: 1.2 mg/dL (ref 0.4–1.2)
Creatinine, Ser: 1.3 mg/dL — ABNORMAL HIGH (ref 0.4–1.2)
GFR calc Af Amer: 47 mL/min — ABNORMAL LOW (ref >60)
GFR calc Af Amer: 52 mL/min — ABNORMAL LOW (ref >60)
GFR calc non Af Amer: 43 mL/min — ABNORMAL LOW (ref >60)
Potassium: 3.9 mEq/L (ref 3.5–5.1)
Potassium: 4 mEq/L (ref 3.5–5.1)
Potassium: 4.3 mEq/L (ref 3.5–5.1)
Sodium: 144 mEq/L (ref 135–145)

## 2011-04-30 LAB — COMPREHENSIVE METABOLIC PANEL
Albumin: 2.7 g/dL — ABNORMAL LOW (ref 3.5–5.2)
BUN: 27 mg/dL — ABNORMAL HIGH (ref 6–23)
Chloride: 106 mEq/L (ref 96–112)
Creatinine, Ser: 1.23 mg/dL — ABNORMAL HIGH (ref 0.4–1.2)
Total Bilirubin: 0.8 mg/dL (ref 0.3–1.2)
Total Protein: 5.3 g/dL — ABNORMAL LOW (ref 6.0–8.3)

## 2011-04-30 LAB — POCT CARDIAC MARKERS
CKMB, poc: 1.5 ng/mL (ref 1.0–8.0)
CKMB, poc: 1.9 ng/mL (ref 1.0–8.0)
Myoglobin, poc: 213 ng/mL (ref 12–200)
Troponin i, poc: <0.05 ng/mL (ref 0.00–0.09)
Troponin i, poc: <0.05 ng/mL (ref 0.00–0.09)

## 2011-04-30 LAB — CBC
HCT: 35.3 % — ABNORMAL LOW (ref 36.0–46.0)
Hemoglobin: 12.9 g/dL (ref 12.0–15.0)
MCHC: 33.6 g/dL (ref 30.0–36.0)
MCV: 95.8 fL (ref 78.0–100.0)
MCV: 96.4 fL (ref 78.0–100.0)
Platelets: 161 10*3/uL (ref 150–400)
Platelets: 158 10*3/uL (ref 150–400)
RBC: 4.06 MIL/uL (ref 3.87–5.11)
RDW: 13.9 % (ref 11.5–15.5)
WBC: 11.6 10*3/uL — ABNORMAL HIGH (ref 4.0–10.5)
WBC: 7 10*3/uL (ref 4.0–10.5)

## 2011-04-30 LAB — GLUCOSE, CAPILLARY: Glucose-Capillary: 79 mg/dL (ref 70–99)

## 2011-04-30 LAB — DIFFERENTIAL
Lymphocytes Relative: 18 % (ref 12–46)
Lymphs Abs: 1.2 10*3/uL (ref 0.7–4.0)
Neutrophils Relative %: 69 % (ref 43–77)

## 2011-04-30 LAB — CARDIAC PANEL(CRET KIN+CKTOT+MB+TROPI)
Relative Index: INVALID (ref 0.0–2.5)
Troponin I: 0.03 ng/mL (ref 0.00–0.06)
Troponin I: 0.04 ng/mL (ref 0.00–0.06)

## 2011-04-30 LAB — T3, FREE: T3, Free: 3 pg/mL (ref 2.3–4.2)

## 2011-04-30 LAB — D-DIMER, QUANTITATIVE: D-Dimer, Quant: 3.27 ug/mL-FEU — ABNORMAL HIGH (ref 0.00–0.48)

## 2011-05-04 LAB — BASIC METABOLIC PANEL
BUN: 22
BUN: 22
Calcium: 8.5
Creatinine, Ser: 1.22 — ABNORMAL HIGH
GFR calc non Af Amer: 42 — ABNORMAL LOW
GFR calc non Af Amer: 42 — ABNORMAL LOW
Glucose, Bld: 101 — ABNORMAL HIGH
Potassium: 4.3

## 2011-05-04 LAB — DIFFERENTIAL
Eosinophils Relative: 7 — ABNORMAL HIGH
Lymphocytes Relative: 26
Lymphs Abs: 1.9
Monocytes Absolute: 0.6
Monocytes Relative: 9

## 2011-05-04 LAB — POCT I-STAT CREATININE: Operator id: 288331

## 2011-05-04 LAB — CK TOTAL AND CKMB (NOT AT ARMC)
CK, MB: 1.5
Relative Index: INVALID
Total CK: 64

## 2011-05-04 LAB — I-STAT 8, (EC8 V) (CONVERTED LAB)
Bicarbonate: 29.6 — ABNORMAL HIGH
Glucose, Bld: 87
Hemoglobin: 13.6
Sodium: 141
TCO2: 31
pCO2, Ven: 39.9 — ABNORMAL LOW
pH, Ven: 7.477 — ABNORMAL HIGH

## 2011-05-04 LAB — POCT CARDIAC MARKERS
CKMB, poc: 1.2
Operator id: 288331
Troponin i, poc: <0.05

## 2011-05-04 LAB — CBC
HCT: 36.3
Hemoglobin: 12.1
RBC: 3.83 — ABNORMAL LOW
RDW: 15.1 — ABNORMAL HIGH
WBC: 7.4

## 2011-05-04 LAB — URINE CULTURE
Colony Count: NO GROWTH
Culture: NO GROWTH

## 2011-05-04 LAB — CARDIAC PANEL(CRET KIN+CKTOT+MB+TROPI)
CK, MB: 1.5
CK, MB: 1.5
Total CK: 69
Troponin I: 0.02
Troponin I: 0.02
Troponin I: 0.03

## 2011-05-04 LAB — HEMOGLOBIN A1C
Hgb A1c MFr Bld: 5.6
Mean Plasma Glucose: 122

## 2011-05-28 ENCOUNTER — Inpatient Hospital Stay (HOSPITAL_COMMUNITY)
Admission: EM | Admit: 2011-05-28 | Discharge: 2011-06-10 | DRG: 208 | Disposition: A | Discharge status: Rehabilitation Facility | Payer: Medicare Other | Source: Ambulatory Visit | Attending: Surgery | Admitting: Surgery

## 2011-05-28 ENCOUNTER — Emergency Department (HOSPITAL_COMMUNITY): Payer: Medicare Other

## 2011-05-28 ENCOUNTER — Encounter (HOSPITAL_COMMUNITY): Payer: Self-pay | Admitting: Cardiology

## 2011-05-28 DIAGNOSIS — Z8673 Personal history of transient ischemic attack (TIA), and cerebral infarction without residual deficits: Secondary | ICD-10-CM

## 2011-05-28 DIAGNOSIS — J9600 Acute respiratory failure, unspecified whether with hypoxia or hypercapnia: Secondary | ICD-10-CM | POA: Diagnosis not present

## 2011-05-28 DIAGNOSIS — E876 Hypokalemia: Secondary | ICD-10-CM | POA: Diagnosis present

## 2011-05-28 DIAGNOSIS — I872 Venous insufficiency (chronic) (peripheral): Secondary | ICD-10-CM

## 2011-05-28 DIAGNOSIS — D638 Anemia in other chronic diseases classified elsewhere: Secondary | ICD-10-CM | POA: Diagnosis present

## 2011-05-28 DIAGNOSIS — T50995A Adverse effect of other drugs, medicaments and biological substances, initial encounter: Secondary | ICD-10-CM | POA: Diagnosis present

## 2011-05-28 DIAGNOSIS — I129 Hypertensive chronic kidney disease with stage 1 through stage 4 chronic kidney disease, or unspecified chronic kidney disease: Secondary | ICD-10-CM | POA: Diagnosis present

## 2011-05-28 DIAGNOSIS — R110 Nausea: Secondary | ICD-10-CM | POA: Diagnosis not present

## 2011-05-28 DIAGNOSIS — I5033 Acute on chronic diastolic (congestive) heart failure: Secondary | ICD-10-CM | POA: Diagnosis present

## 2011-05-28 DIAGNOSIS — I959 Hypotension, unspecified: Secondary | ICD-10-CM | POA: Diagnosis present

## 2011-05-28 DIAGNOSIS — I2789 Other specified pulmonary heart diseases: Secondary | ICD-10-CM | POA: Diagnosis present

## 2011-05-28 DIAGNOSIS — Z8719 Personal history of other diseases of the digestive system: Secondary | ICD-10-CM

## 2011-05-28 DIAGNOSIS — Z85038 Personal history of other malignant neoplasm of large intestine: Secondary | ICD-10-CM

## 2011-05-28 DIAGNOSIS — J189 Pneumonia, unspecified organism: Secondary | ICD-10-CM | POA: Diagnosis present

## 2011-05-28 DIAGNOSIS — Z7982 Long term (current) use of aspirin: Secondary | ICD-10-CM

## 2011-05-28 DIAGNOSIS — N184 Chronic kidney disease, stage 4 (severe): Secondary | ICD-10-CM | POA: Diagnosis present

## 2011-05-28 DIAGNOSIS — G8929 Other chronic pain: Secondary | ICD-10-CM | POA: Diagnosis present

## 2011-05-28 DIAGNOSIS — I059 Rheumatic mitral valve disease, unspecified: Secondary | ICD-10-CM | POA: Diagnosis present

## 2011-05-28 DIAGNOSIS — J9819 Other pulmonary collapse: Secondary | ICD-10-CM | POA: Diagnosis present

## 2011-05-28 DIAGNOSIS — K219 Gastro-esophageal reflux disease without esophagitis: Secondary | ICD-10-CM

## 2011-05-28 DIAGNOSIS — I340 Nonrheumatic mitral (valve) insufficiency: Secondary | ICD-10-CM | POA: Diagnosis present

## 2011-05-28 DIAGNOSIS — J9811 Atelectasis: Secondary | ICD-10-CM | POA: Diagnosis not present

## 2011-05-28 DIAGNOSIS — M549 Dorsalgia, unspecified: Secondary | ICD-10-CM | POA: Diagnosis present

## 2011-05-28 DIAGNOSIS — J40 Bronchitis, not specified as acute or chronic: Secondary | ICD-10-CM | POA: Diagnosis present

## 2011-05-28 DIAGNOSIS — M810 Age-related osteoporosis without current pathological fracture: Secondary | ICD-10-CM | POA: Diagnosis present

## 2011-05-28 DIAGNOSIS — N058 Unspecified nephritic syndrome with other morphologic changes: Secondary | ICD-10-CM | POA: Diagnosis present

## 2011-05-28 DIAGNOSIS — E785 Hyperlipidemia, unspecified: Secondary | ICD-10-CM

## 2011-05-28 DIAGNOSIS — M1610 Unilateral primary osteoarthritis, unspecified hip: Secondary | ICD-10-CM | POA: Diagnosis present

## 2011-05-28 DIAGNOSIS — R350 Frequency of micturition: Secondary | ICD-10-CM | POA: Diagnosis present

## 2011-05-28 DIAGNOSIS — Z86711 Personal history of pulmonary embolism: Secondary | ICD-10-CM

## 2011-05-28 DIAGNOSIS — Z96649 Presence of unspecified artificial hip joint: Secondary | ICD-10-CM

## 2011-05-28 DIAGNOSIS — K5730 Diverticulosis of large intestine without perforation or abscess without bleeding: Secondary | ICD-10-CM | POA: Diagnosis present

## 2011-05-28 DIAGNOSIS — N183 Chronic kidney disease, stage 3 unspecified: Secondary | ICD-10-CM

## 2011-05-28 DIAGNOSIS — I4891 Unspecified atrial fibrillation: Secondary | ICD-10-CM

## 2011-05-28 DIAGNOSIS — N179 Acute kidney failure, unspecified: Secondary | ICD-10-CM | POA: Diagnosis not present

## 2011-05-28 DIAGNOSIS — Z79899 Other long term (current) drug therapy: Secondary | ICD-10-CM

## 2011-05-28 DIAGNOSIS — I272 Pulmonary hypertension, unspecified: Secondary | ICD-10-CM

## 2011-05-28 DIAGNOSIS — I509 Heart failure, unspecified: Secondary | ICD-10-CM | POA: Diagnosis present

## 2011-05-28 DIAGNOSIS — I119 Hypertensive heart disease without heart failure: Secondary | ICD-10-CM

## 2011-05-28 DIAGNOSIS — R5381 Other malaise: Secondary | ICD-10-CM | POA: Diagnosis present

## 2011-05-28 DIAGNOSIS — K5900 Constipation, unspecified: Secondary | ICD-10-CM | POA: Diagnosis present

## 2011-05-28 DIAGNOSIS — J45909 Unspecified asthma, uncomplicated: Secondary | ICD-10-CM | POA: Insufficient documentation

## 2011-05-28 DIAGNOSIS — M109 Gout, unspecified: Secondary | ICD-10-CM | POA: Insufficient documentation

## 2011-05-28 DIAGNOSIS — Z9849 Cataract extraction status, unspecified eye: Secondary | ICD-10-CM

## 2011-05-28 HISTORY — DX: Unspecified atrial fibrillation: I48.91

## 2011-05-28 HISTORY — DX: Secondary hyperparathyroidism of renal origin: N25.81

## 2011-05-28 HISTORY — DX: Venous insufficiency (chronic) (peripheral): I87.2

## 2011-05-28 HISTORY — DX: Pulmonary hypertension, unspecified: I27.20

## 2011-05-28 HISTORY — DX: Personal history of pulmonary embolism: Z86.711

## 2011-05-28 HISTORY — DX: Chronic kidney disease, stage 3 (moderate): N18.3

## 2011-05-28 HISTORY — DX: Gastro-esophageal reflux disease without esophagitis: K21.9

## 2011-05-28 HISTORY — DX: Chronic kidney disease, stage 3 unspecified: N18.30

## 2011-05-28 HISTORY — DX: Personal history of other diseases of the digestive system: Z87.19

## 2011-05-28 HISTORY — DX: Hyperlipidemia, unspecified: E78.5

## 2011-05-28 HISTORY — DX: Hypertensive heart disease without heart failure: I11.9

## 2011-05-28 HISTORY — DX: Acute on chronic diastolic (congestive) heart failure: I50.33

## 2011-05-28 HISTORY — DX: Nonrheumatic mitral (valve) insufficiency: I34.0

## 2011-05-28 LAB — COMPREHENSIVE METABOLIC PANEL
ALT: 11 U/L (ref 0–35)
AST: 18 U/L (ref 0–37)
CO2: 32 mEq/L (ref 19–32)
Calcium: 9.3 mg/dL (ref 8.4–10.5)
Chloride: 100 mEq/L (ref 96–112)
Creatinine, Ser: 1.32 mg/dL — ABNORMAL HIGH (ref 0.50–1.10)
GFR calc Af Amer: 41 mL/min — ABNORMAL LOW (ref >90)
GFR calc non Af Amer: 35 mL/min — ABNORMAL LOW (ref >90)
Glucose, Bld: 105 mg/dL — ABNORMAL HIGH (ref 70–99)
Sodium: 142 mEq/L (ref 135–145)
Total Bilirubin: 0.6 mg/dL (ref 0.3–1.2)

## 2011-05-28 LAB — POCT I-STAT TROPONIN I: Troponin i, poc: 0.02 ng/mL (ref 0.00–0.08)

## 2011-05-28 LAB — POCT I-STAT, CHEM 8
BUN: 27 mg/dL — ABNORMAL HIGH (ref 6–23)
Calcium, Ion: 1.12 mmol/L (ref 1.12–1.32)
Chloride: 102 mEq/L (ref 96–112)
Creatinine, Ser: 1.6 mg/dL — ABNORMAL HIGH (ref 0.50–1.10)
Glucose, Bld: 104 mg/dL — ABNORMAL HIGH (ref 70–99)
HCT: 38 % (ref 36.0–46.0)
Hemoglobin: 12.9 g/dL (ref 12.0–15.0)
Potassium: 3.8 mEq/L (ref 3.5–5.1)
Sodium: 140 mEq/L (ref 135–145)
TCO2: 28 mmol/L (ref 0–100)

## 2011-05-28 LAB — URINALYSIS, ROUTINE W REFLEX MICROSCOPIC
Bilirubin Urine: NEGATIVE
Glucose, UA: NEGATIVE mg/dL
Hgb urine dipstick: NEGATIVE
Ketones, ur: NEGATIVE mg/dL
Protein, ur: NEGATIVE mg/dL

## 2011-05-28 LAB — CBC
HCT: 36.6 % (ref 36.0–46.0)
MCH: 31.2 pg (ref 26.0–34.0)
MCHC: 32.8 g/dL (ref 30.0–36.0)
MCV: 95.1 fL (ref 78.0–100.0)
RDW: 14.2 % (ref 11.5–15.5)

## 2011-05-28 LAB — PROTIME-INR
INR: 1.1 (ref 0.00–1.49)
Prothrombin Time: 14.4 seconds (ref 11.6–15.2)

## 2011-05-28 LAB — PRO B NATRIURETIC PEPTIDE: Pro B Natriuretic peptide (BNP): 4137 pg/mL — ABNORMAL HIGH (ref 0–450)

## 2011-05-29 ENCOUNTER — Inpatient Hospital Stay (HOSPITAL_COMMUNITY): Payer: Medicare Other

## 2011-05-29 LAB — BASIC METABOLIC PANEL
BUN: 23 mg/dL (ref 6–23)
CO2: 36 mEq/L — ABNORMAL HIGH (ref 19–32)
Calcium: 9 mg/dL (ref 8.4–10.5)
Chloride: 91 mEq/L — ABNORMAL LOW (ref 96–112)
GFR calc Af Amer: 41 mL/min — ABNORMAL LOW (ref >90)
GFR calc non Af Amer: 35 mL/min — ABNORMAL LOW (ref >90)
Glucose, Bld: 88 mg/dL (ref 70–99)
Glucose, Bld: 174 mg/dL — ABNORMAL HIGH (ref 70–99)
Potassium: 3.1 mEq/L — ABNORMAL LOW (ref 3.5–5.1)
Potassium: 3.5 mEq/L (ref 3.5–5.1)
Sodium: 138 mEq/L (ref 135–145)

## 2011-05-29 MED ORDER — IOHEXOL 350 MG/ML SOLN
60.0000 mL | Freq: Once | INTRAVENOUS | Status: AC | PRN
  Administered 2011-05-29: 60 mL via INTRAVENOUS

## 2011-05-29 MED ORDER — DOXYCYCLINE HYCLATE 100 MG PO TABS
100.0000 mg | ORAL_TABLET | Freq: Two times a day (BID) | ORAL | Status: DC
  Administered 2011-05-30: 100 mg via ORAL
  Filled 2011-05-29 (×4): qty 1

## 2011-05-29 MED ORDER — FUROSEMIDE 10 MG/ML IJ SOLN: 40.0000 mg | Freq: Three times a day (TID) | INTRAMUSCULAR | Status: DC

## 2011-05-29 MED ORDER — AMOXICILLIN 250 MG PO CAPS
250.0000 mg | ORAL_CAPSULE | Freq: Three times a day (TID) | ORAL | Status: DC
  Administered 2011-05-30: 250 mg via ORAL
  Filled 2011-05-29 (×6): qty 1

## 2011-05-29 MED ORDER — BUDESONIDE-FORMOTEROL FUMARATE 80-4.5 MCG/ACT IN AERO
1.0000 | INHALATION_SPRAY | Freq: Two times a day (BID) | RESPIRATORY_TRACT | Status: DC
  Administered 2011-05-30: 1 via RESPIRATORY_TRACT

## 2011-05-29 MED ORDER — TRAMADOL HCL 50 MG PO TABS
50.0000 mg | ORAL_TABLET | Freq: Four times a day (QID) | ORAL | Status: DC | PRN
  Administered 2011-05-30 – 2011-05-31 (×3): 50 mg via ORAL
  Filled 2011-05-29 (×4): qty 1

## 2011-05-29 MED ORDER — DILTIAZEM HCL ER COATED BEADS 180 MG PO CP24
180.0000 mg | ORAL_CAPSULE | Freq: Every day | ORAL | Status: DC
  Administered 2011-05-30 – 2011-06-04 (×6): 180 mg via ORAL
  Filled 2011-05-29 (×7): qty 1

## 2011-05-29 MED ORDER — MONTELUKAST SODIUM 10 MG PO TABS
10.0000 mg | ORAL_TABLET | Freq: Every day | ORAL | Status: DC
  Administered 2011-05-31 – 2011-06-03 (×5): 10 mg via ORAL
  Filled 2011-05-29 (×7): qty 1

## 2011-05-29 MED ORDER — PRAVASTATIN SODIUM 20 MG PO TABS
20.0000 mg | ORAL_TABLET | Freq: Every day | ORAL | Status: DC
  Administered 2011-05-30 – 2011-06-03 (×5): 20 mg via ORAL
  Filled 2011-05-29: qty 1
  Filled 2011-05-29 (×2): qty 0.5
  Filled 2011-05-29: qty 1
  Filled 2011-05-29: qty 0.5
  Filled 2011-05-29 (×3): qty 1

## 2011-05-29 MED ORDER — DOXAZOSIN MESYLATE 4 MG PO TABS
4.0000 mg | ORAL_TABLET | Freq: Every day | ORAL | Status: DC
  Administered 2011-05-31 – 2011-06-03 (×5): 4 mg via ORAL
  Filled 2011-05-29 (×3): qty 0.5
  Filled 2011-05-29 (×4): qty 1

## 2011-05-29 MED ORDER — BENZONATATE 100 MG PO CAPS
100.0000 mg | ORAL_CAPSULE | Freq: Three times a day (TID) | ORAL | Status: DC | PRN
  Administered 2011-05-30 – 2011-05-31 (×2): 100 mg via ORAL
  Filled 2011-05-29 (×2): qty 1

## 2011-05-29 MED ORDER — FUROSEMIDE 10 MG/ML IJ SOLN
40.0000 mg | Freq: Two times a day (BID) | INTRAMUSCULAR | Status: DC
  Administered 2011-05-30: 40 mg via INTRAVENOUS

## 2011-05-29 MED ORDER — AMIODARONE HCL 200 MG PO TABS
200.0000 mg | ORAL_TABLET | ORAL | Status: DC
  Administered 2011-05-31 – 2011-06-04 (×3): 200 mg via ORAL
  Filled 2011-05-29 (×3): qty 1

## 2011-05-29 MED ORDER — PANTOPRAZOLE SODIUM 40 MG PO TBEC
40.0000 mg | DELAYED_RELEASE_TABLET | Freq: Every day | ORAL | Status: DC
  Administered 2011-05-30 – 2011-06-03 (×5): 40 mg via ORAL
  Filled 2011-05-29 (×5): qty 1

## 2011-05-29 MED ORDER — ENOXAPARIN SODIUM 30 MG/0.3ML ~~LOC~~ SOLN
30.0000 mg | Freq: Every day | SUBCUTANEOUS | Status: DC
  Administered 2011-05-31: 30 mg via SUBCUTANEOUS
  Filled 2011-05-29 (×3): qty 0.3

## 2011-05-30 DIAGNOSIS — J159 Unspecified bacterial pneumonia: Secondary | ICD-10-CM

## 2011-05-30 DIAGNOSIS — J9819 Other pulmonary collapse: Secondary | ICD-10-CM

## 2011-05-30 DIAGNOSIS — J9811 Atelectasis: Secondary | ICD-10-CM | POA: Diagnosis not present

## 2011-05-30 LAB — BASIC METABOLIC PANEL
BUN: 31 mg/dL — ABNORMAL HIGH (ref 6–23)
Calcium: 8.9 mg/dL (ref 8.4–10.5)
Chloride: 92 mEq/L — ABNORMAL LOW (ref 96–112)
Creatinine, Ser: 1.67 mg/dL — ABNORMAL HIGH (ref 0.50–1.10)
GFR calc non Af Amer: 26 mL/min — ABNORMAL LOW (ref >90)

## 2011-05-30 MED ORDER — MOXIFLOXACIN HCL IN NACL 400 MG/250ML IV SOLN
400.0000 mg | INTRAVENOUS | Status: DC
  Administered 2011-05-30 – 2011-06-02 (×4): 400 mg via INTRAVENOUS
  Filled 2011-05-30 (×7): qty 250

## 2011-05-30 MED ORDER — ALBUTEROL SULFATE (5 MG/ML) 0.5% IN NEBU
2.5000 mg | INHALATION_SOLUTION | RESPIRATORY_TRACT | Status: DC
  Administered 2011-05-30 – 2011-06-05 (×22): 2.5 mg via RESPIRATORY_TRACT
  Filled 2011-05-30 (×54): qty 0.5

## 2011-05-30 NOTE — Progress Notes (Signed)
Echocardiogram 2D Echocardiogram has been performed.  [name], [name] 05/30/2011, 2:15 PM

## 2011-05-30 NOTE — Progress Notes (Addendum)
Subjective: Marland Kitchen The patient continues to complain of cough productive of yellow sputum.  She denies any chills but continues with a low grade fever.  Chest xray shows debris in left main stem bronchus and findings suggestive of pneumonia.  No evidence of CHF.    Objective:  Vital Signs in the last 24 hours: Temp:  [99.7 F (37.6 C)] 99.7 F (37.6 C) (11/04 0500) Pulse Rate:  [59-83] 67  (11/04 0700) Resp:  [16-18] 18  (11/04 0700) BP: (120-126)/(62-69) 126/69 mmHg (11/04 0500) Weight:  [68 kg (149 lb 14.6 oz)-68.72 kg (151 lb 8 oz)] 151 lb 8 oz (68.72 kg) (11/04 0500)  Intake/Output from previous day: 11/03 0701 - 11/04 0700 In: 240 [P.O.:240] Out: 150 [Urine:150] Intake/Output from this shift:    Physical Exam: Lungs have diffuse rhonchi and wheezes at left base Heart regular rate and rhythm Extremities no edema  Lab Results:  Basename 05/28/11 1343 05/28/11 1335  WBC -- 7.4  HGB 12.9 12.0  PLT -- 204    Basename 05/30/11 0642 05/29/11 1711  NA 140 138  K 3.6 3.5  CL 92* 91*  CO2 37* 36*  GLUCOSE 113* 174*  BUN 31* 25*  CREATININE 1.67* 1.46*   No results found for this basename: TROPONINI:2,CK,MB:2 in the last 72 hours Hepatic Function Panel  Basename 05/28/11 1736  PROT 6.3  ALBUMIN 3.3*  AST 18  ALT 11  ALKPHOS 103  BILITOT 0.6  BILIDIR --  IBILI --   No results found for this basename: CHOL in the last 72 hours No results found for this basename: PROTIME in the last 72 hours  Imaging: Imaging results have been reviewed. Fluid/debris fills the left main stem bronchus and lower lobe  bronchi. Associated airspace disease throughout much the left lung  which could represent pneumonia, collapse, or drowned lung. Cannot  exclude mucous plug. Small left pleural effusion.  No evidence of pulmonary embolus.  Cardiomegaly, coronary artery disease.  Mild mediastinal and right hilar adenopathy, presumably reactive.     Cardiac Studies:  Assessment: 1.   Acute Bronchitis with chest CT showing fluid and debris in the left main stem bronchus and lower lobe bronchi with airspace disease in left lung c/w pneumonia and collapse. 2.  Acute on chronic diastolic heart failure with elevated BNP.  Clinically she does not appear volume overloaded.  Chest CT does not indicate CHF and renal function c/w prerenal state from diuresis. 3.  PAF in NSR on Amio 4.  Pulmonary HTN 5.  History of PE but no evidence of PE on chest CT  6.  Hypokalemia resolved  Plan: 1.  Pulmonary consult 2.  Continue current antibiotics until seen by Pulmonary 3.  Will check results of echo 4.  CBC and BMET in am 5.  D/C Lasix      LOS: 2 days    TURNER,TRACI R 05/30/2011, 10:30 AM

## 2011-05-30 NOTE — Consult Note (Signed)
Reason for Consult:  L atx on CT scan Referring Physician: Armanda Magic PCP - Laurann Montana  [name] is an 77 y.o. female with hx diastolic CHF, hx PE prev on coumadin but d/c recently r/t GI bleeding. Presented 11/2 with 2 weeks cough and wheezing with minimal yellow sputum. PCP sent pt to ER and found to have O2 sats 80%.  Admitted by cardiology with CHF v bronchitis.  CXR revealed retrocardiac infiltrate and CT chest NEG PE, but showed LLL atx and pulmonary consulted.   Past Medical History  Diagnosis Date  . Acute on chronic diastolic congestive heart failure 05/28/2011  . Hypertensive heart disease without CHF 05/28/2011  . History of pulmonary embolus (PE) 05/28/2011    2006 following pelvic fracture  . Chronic venous insufficiency 05/28/2011  . Hyperlipidemia 05/28/2011  . Gout 05/28/2011  . Asthma 05/28/2011  . History of GI diverticular bleed 05/28/2011    June 2012  . Atrial fibrillation 05/28/2011  . Chronic kidney disease stage 3 05/28/2011  . Kidney stones   . Osteoporosis   . Pulmonary hypertension 05/28/2011  . GERD (gastroesophageal reflux disease) 05/28/2011   Past Surgical History  Procedure Date  . Partial colectomy     1999 following colonoscopy complication  . Left hip replacement     2004, complicated with dislocation  . Right hip replacement     2005  . Hernia repair   . Bilateral cataract surgery    History   Social History  . Marital Status: Widowed    Spouse Name: N/A    Number of Children: N/A  . Years of Education: N/A   Occupational History  . Not on file.   Social History Main Topics  . Smoking status: Never Smoker   . Smokeless tobacco: Never Used  . Alcohol Use: No  . Drug Use: Not on file  . Sexually Active: Not on file   Other Topics Concern  . 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 amiodarone  200 mg Oral Q M,W,F  . amoxicillin  250 mg Oral TID  . budesonide-formoterol  1 puff Inhalation BID  . diltiazem  180 mg Oral Daily  . doxazosin  4 mg Oral QHS  . doxycycline  100 mg Oral Q12H  . enoxaparin (LOVENOX) injection  30 mg Subcutaneous QHS  . montelukast  10 mg Oral QHS  . pantoprazole  40 mg Oral Q1200  . pravastatin  20 mg Oral q1800  . DISCONTD: furosemide  40 mg Intramuscular Q8H  . DISCONTD: furosemide  40 mg Intravenous BID       LABS Results for orders placed during the hospital encounter of 05/28/11 (from the past 24 hour(s))  BASIC METABOLIC PANEL     Status: Abnormal   Collection Time   05/29/11  5:11 PM      Component Value Range   Sodium 138  135 - 145 (mEq/L)   Potassium 3.5  3.5 - 5.1 (mEq/L)   Chloride 91 (*) 96 - 112 (mEq/L)   CO2 36 (*) 19 - 32 (mEq/L)   Glucose, Bld 174 (*) 70 - 99 (mg/dL)  BUN 25 (*) 6 - 23 (mg/dL)   Creatinine, Ser 1.61 (*) 0.50 - 1.10 (mg/dL)   Calcium 9.0  8.4 - 09.6 (mg/dL)   GFR calc non Af Amer 31 (*) >90 (mL/min)   GFR calc Af Amer 36 (*) >90 (mL/min)  BASIC METABOLIC PANEL     Status: Abnormal   Collection Time   05/30/11  6:42 AM      Component Value Range   Sodium 140  135 - 145 (mEq/L)   Potassium 3.6  3.5 - 5.1 (mEq/L)   Chloride 92 (*) 96 - 112 (mEq/L)   CO2 37 (*) 19 - 32 (mEq/L)   Glucose, Bld 113 (*) 70 - 99 (mg/dL)   BUN 31 (*) 6 - 23 (mg/dL)   Creatinine, Ser 0.45 (*) 0.50 - 1.10 (mg/dL)   Calcium 8.9  8.4 - 40.9 (mg/dL)   GFR calc non Af Amer 26 (*) >90 (mL/min)   GFR calc Af Amer 31 (*) >90 (mL/min)  PRO B NATRIURETIC PEPTIDE     Status: Abnormal   Collection Time   05/30/11  6:42 AM      Component Value Range   BNP, POC 8555.0 (*) 0 - 450 (pg/mL)   A/ plan  1. Left atelectasis - related to bronchitis & mucus  plugging Discussed bronchoscopy for diagnostic & therapeutic reasons - discussed risks & benefits - pt prefers conservative measures first. Wil try nebs, chest PT via vest & antibiotic - prefer IV avelox to amox/ doxy  2. Doubt malignancy in this never smoker  Daughter [name] updated re: plan of care  [facility] 05/30/2011, 3:31 PM

## 2011-05-31 ENCOUNTER — Encounter (HOSPITAL_COMMUNITY): Payer: Self-pay | Admitting: Cardiology

## 2011-05-31 ENCOUNTER — Inpatient Hospital Stay (HOSPITAL_COMMUNITY): Payer: Medicare Other

## 2011-05-31 DIAGNOSIS — N179 Acute kidney failure, unspecified: Secondary | ICD-10-CM | POA: Diagnosis not present

## 2011-05-31 DIAGNOSIS — I340 Nonrheumatic mitral (valve) insufficiency: Secondary | ICD-10-CM

## 2011-05-31 DIAGNOSIS — J189 Pneumonia, unspecified organism: Secondary | ICD-10-CM | POA: Diagnosis present

## 2011-05-31 HISTORY — DX: Nonrheumatic mitral (valve) insufficiency: I34.0

## 2011-05-31 LAB — CBC
HCT: 33.3 % — ABNORMAL LOW (ref 36.0–46.0)
MCH: 30.3 pg (ref 26.0–34.0)
MCV: 94.3 fL (ref 78.0–100.0)
Platelets: 183 10*3/uL (ref 150–400)
RBC: 3.53 MIL/uL — ABNORMAL LOW (ref 3.87–5.11)
WBC: 17.7 10*3/uL — ABNORMAL HIGH (ref 4.0–10.5)

## 2011-05-31 LAB — BASIC METABOLIC PANEL
BUN: 47 mg/dL — ABNORMAL HIGH (ref 6–23)
CO2: 33 mEq/L — ABNORMAL HIGH (ref 19–32)
Calcium: 8.9 mg/dL (ref 8.4–10.5)
Creatinine, Ser: 2.82 mg/dL — ABNORMAL HIGH (ref 0.50–1.10)
GFR calc non Af Amer: 14 mL/min — ABNORMAL LOW (ref >90)
Glucose, Bld: 135 mg/dL — ABNORMAL HIGH (ref 70–99)
Sodium: 134 mEq/L — ABNORMAL LOW (ref 135–145)

## 2011-05-31 LAB — DIFFERENTIAL
Eosinophils Absolute: 0 10*3/uL (ref 0.0–0.7)
Eosinophils Relative: 0 % (ref 0–5)
Lymphocytes Relative: 5 % — ABNORMAL LOW (ref 12–46)
Lymphs Abs: 0.9 10*3/uL (ref 0.7–4.0)
Monocytes Absolute: 2.1 10*3/uL — ABNORMAL HIGH (ref 0.1–1.0)
Monocytes Relative: 12 % (ref 3–12)

## 2011-05-31 MED ORDER — HEPARIN SODIUM (PORCINE) 5000 UNIT/ML IJ SOLN
5000.0000 [IU] | Freq: Three times a day (TID) | INTRAMUSCULAR | Status: DC
  Administered 2011-05-31 – 2011-06-10 (×28): 5000 [IU] via SUBCUTANEOUS
  Filled 2011-05-31 (×34): qty 1

## 2011-05-31 NOTE — H&P (Signed)
NAMESHONDREA, STEINERT NO.:  [id number]      MEDICAL RECORD NO.:  [id number]      LOCATION:  [facility]                         FACILITY:  [facility]      PHYSICIAN:  [name], M.D.DATE OF BIRTH:  Mar 11, 1922      DATE OF ADMISSION:  05/28/2011   DATE OF DISCHARGE:                                 HISTORY & PHYSICAL         I was asked to see this 77 year old female for evaluation of heart   failure in the emergency room.  The patient has a prior history of   diastolic congestive heart failure.  The last echo in May showing   preserved LV systolic function, but significant pulmonary hypertension.   She also has chronic underlying lung disease, hypertensive heart   disease, paroxysmal atrial fibrillation, hyperlipidemia, chronic venous   insufficiency.  She has a history of pulmonary embolus in the past.  She   was hospitalized with a GI bleeding in June thought to be due to   diverticular disease and also had gout at that time.  She was continued   on amiodarone at that time.  She was just seen in the office a week ago   was actually feeling good at that time and did not really have any   complaints to me.  She, however, stated that she has been having a cough   for about 2-3 weeks and stated was productive of yellowish sputum,   although she was not having severe dyspnea.  She saw her primary doctor   today because of the cough who felt that she needed to be admitted.  She   was brought to the emergency room where she was found to have oxygen   saturation of 87% and was admitted to the hospital for treatment of   possible heart failure versus bronchitis.  She has not had PND or   orthopnea.  She has not had recent edema.  Has not had any significant   chest pain.  She has not had syncope recently.      PAST HISTORY:  Remarkable for hyperlipidemia.  She has a history of   pulmonary embolus in December 2006, chronic venous insufficiency,   hyperlipidemia, gout, GI  bleeding due to diverticulosis, osteoarthritis   and history of asthma.      PREVIOUS SURGERY:  She has had a partial colectomy for colon cancer, she   has had a partial colon surgery, inguinal herniorrhaphy, right knee   replacement and left hip replacement.      ALLERGIES:  CLONIDINE causes a rash.  She is intolerant to LISINOPRIL   and PRIMIDONE.  WARFARIN as previously caused a GI bleed.      CURRENT MEDICATIONS:   1. Omeprazole 20 mg daily.   2. Aspirin 81 mg daily.   3. Bumetanide 2 mg daily.   4. Singular 10 mg daily.   5. Doxazosin 4 mg at bedtime .   6. Symbicort 80/4.5 b.i.d.   7. Multivitamins daily.   8. Pravastatin 20 mg daily.   9. Vitamin D 1000 units daily.   10.Diltiazem extended release  180 mg daily.   11.Amiodarone 200 mg Monday, Wednesday and Friday.   12.Colchicine 0.6 mg p.r.n.   13.Iron sulfate 325 mg daily.   14.Mucinex 600 mg daily.   15.Tramadol 50 mg p.r.n.   16.Delsym as needed.      SOCIAL HISTORY:  She is a widow and lives with her daughter.  She is a   retired Airline pilot.  She has never smoked.  Does not use alcohol to   excess.      REVIEW OF SYSTEMS:  Her weight has been stable.  She has chronic malaise   and fatigue.  She wears eyeglasses and has previous cataract.  She does   not have any skin complaints.  She has developed recent cough and has   had some mild yellow sputum production.  ABDOMINAL:  She has not had any   recent GI bleeding.  Has mild constipation.  She has not had any recent   edema.  Has significant arthritis.  She has urinary frequency.  She has   significant arthritis of the left hip but has a very unsteady gait.   Other than as noted above, the remainder review of systems is   unremarkable.      EXAMINATION:  GENERAL:  She is a pleasant elderly white female who is   currently in no acute distress.  Moderately obese.   VITAL SIGNS: Blood pressure is currently 157/70, pulse is currently 70   and regular.   SKIN:  Warm  and dry.  There is a significant ectropion present on the   right eye. EOMI PERRLA.  C and S clear.  Fundi not examined.  Pharynx is   negative.  There is jugular venous distention noted with cannon V waves   noted.   LUNGS:  Decreased breath sounds bilaterally.  Very faint rales heard.   CARDIOVASCULAR:  She has a soft 2/6 blowing systolic murmur at the   aortic valve.  There is no diastolic murmur.  No S3 is noted.   ABDOMEN:  Soft, and nontender.   EXTREMITIES:  She has venous support stockings on.  There is no edema   noted.  NEUROLOGIC:  Grossly normal with normal cranial nerves.  Sensory   and motor are intact.   GU and RECTAL:  Deferred.      LABORATORY DATA:  EKG shows normal sinus rhythm.  Chest x-ray shows   pulmonary venous hypertension but no overt pulmonary edema.  Mild   cardiomegaly calcification in the aorta.  Her white count was normal.   BUN was 27, and creatinine was mildly elevated.      IMPRESSION:   1. Cough is questionable whether this is bronchitis or heart failure.   2. Acute on chronic diastolic heart failure with elevated BNP and       elevated neck veins.   3. Hypertensive heart disease.   4. Stage III chronic kidney disease.   5. Previous history of pulmonary embolus in 2006 following a pelvic       fracture.   6. Previous history of gastrointestinal bleeding due to diverticular       disease.      RECOMMENDATIONS:  She will be placed on DVT prophylaxis.  We will get a   D-dimer to be sure she has not had recurrent pulmonary emboli, diurese   because of the jugular venous distention.  Recheck echocardiogram.               Leeroy Bock  Donnie Aho, M.D.

## 2011-05-31 NOTE — Progress Notes (Addendum)
Subjective:   Still coughing, feels better though, still mildly SOB, c/o pleuritic chest pain esp in back area. Less cough productive of yellow sputum.  Objective:  Vital Signs in the last 24 hours:  height is 5\' 3"  (1.6 m) and weight is 68.72 kg (151 lb 8 oz). Her oral temperature is 99.7 F (37.6 C). Her blood pressure is 115/61 and her pulse is 82. Her respiration is 16 and oxygen saturation is 90%.   Physical Exam: BP Readings from Last 1 Encounters:  05/31/11 115/61     Wt Readings from Last 1 Encounters:  05/30/11 68.72 kg (151 lb 8 oz)    Lungs:  Clear to A&P anteriorly, no rales Cardiac:  Regular rhythm, normal S1 and S2, no S3, 2/6 holosystolic  murmur radiating to neck Abdomen:  Soft, nontender, no masses Extremities:  No edema present  Intake/Output from previous day: 11/04 0701 - 11/05 0700 In: 360 [P.O.:360] Out: 351 [Urine:350; Stool:1] Results for orders placed during the hospital encounter of 05/28/11 (from the past 24 hour(s))  BASIC METABOLIC PANEL     Status: Abnormal   Collection Time   05/31/11  5:00 AM      Component Value Range   Sodium 134 (*) 135 - 145 (mEq/L)   Potassium 3.4 (*) 3.5 - 5.1 (mEq/L)   Chloride 88 (*) 96 - 112 (mEq/L)   CO2 33 (*) 19 - 32 (mEq/L)   Glucose, Bld 135 (*) 70 - 99 (mg/dL)   BUN 47 (*) 6 - 23 (mg/dL)   Creatinine, Ser 9.14 (*) 0.50 - 1.10 (mg/dL)   Calcium 8.9  8.4 - 78.2 (mg/dL)   GFR calc non Af Amer 14 (*) >90 (mL/min)   GFR calc Af Amer 16 (*) >90 (mL/min)  CBC     Status: Abnormal   Collection Time   05/31/11  6:00 AM      Component Value Range   WBC 17.7 (*) 4.0 - 10.5 (K/uL)   RBC 3.53 (*) 3.87 - 5.11 (MIL/uL)   Hemoglobin 10.7 (*) 12.0 - 15.0 (g/dL)   HCT 95.6 (*) 21.3 - 46.0 (%)   MCV 94.3  78.0 - 100.0 (fL)   MCH 30.3  26.0 - 34.0 (pg)   MCHC 32.1  30.0 - 36.0 (g/dL)   RDW 08.6  57.8 - 46.9 (%)   Platelets 183  150 - 400 (K/uL)  DIFFERENTIAL     Status: Abnormal   Collection Time   05/31/11  6:00 AM      Component Value Range   Neutrophils Relative 83 (*) 43 - 77 (%)   Neutro Abs 14.6 (*) 1.7 - 7.7 (K/uL)   Lymphocytes Relative 5 (*) 12 - 46 (%)   Lymphs Abs 0.9  0.7 - 4.0 (K/uL)   Monocytes Relative 12  3 - 12 (%)   Monocytes Absolute 2.1 (*) 0.1 - 1.0 (K/uL)   Eosinophils Relative 0  0 - 5 (%)   Eosinophils Absolute 0.0  0.0 - 0.7 (K/uL)   Basophils Relative 0  0 - 1 (%)   Basophils Absolute 0.0  0.0 - 0.1 (K/uL)   Assessment/Plan:   1. Pneumonia on CT scan with cough and pleurtic pain,  WBC elevated today 2. Mitral regurgitation  With pulmonary hypertension 3. Chronic diastolic CHF, doubt this is primary problem 4. Acute on chronic renal insufficiency due to diuresis continue to hold diuretics, May need renal consult if continues to worsen.  Plan:  Appreciate pul consult.  Plan  IV Avelox, hold diuretics.  D/C foley.  [name],[name] 05/31/2011, 6:41 AM

## 2011-05-31 NOTE — Progress Notes (Signed)
Utilization Review Complete.  [name] 05/31/2011

## 2011-05-31 NOTE — Progress Notes (Signed)
"  Please see shadow chart for Care Mgt documentation".

## 2011-05-31 NOTE — Progress Notes (Signed)
CPT performed at 1146

## 2011-05-31 NOTE — Progress Notes (Signed)
  [name] is an 77 y.o. female. With bronchitis x 5 ds & lt atelectasis on CT Vitals Patient Vitals for the past 3 hrs:  BP Temp Temp src Pulse Resp SpO2  05/31/11 1342 97/55 mmHg 98.5 F (36.9 C) Oral 83  17  91 %  ]   Intake/Output Summary (Last 24 hours) at 05/31/11 1508 Last data filed at 05/31/11 1300  Gross per 24 hour  Intake    420 ml  Output    350 ml  Net     70 ml   C/o cough, denies CP  EXAM Gen: Pleasant, well-nourished, in no distress,  normal affect  ENT: No lesions,  mouth clear,  oropharynx clear, no postnasal drip  Neck: No JVD, no TMG, no carotid bruits  Lungs: No use of accessory muscles, no dullness to percussion, decreased  On left, no rales or rhonchi  Cardiovascular: RRR, heart sounds normal, no murmur or gallops, no peripheral edema  Abdomen: soft and NT, no HSM,  BS normal  Neuro: alert, non focal  Skin: Warm, no lesions or rashes  LAB RESULTS CBC    Component Value Date/Time   WBC 17.7* 05/31/2011 0600   RBC 3.53* 05/31/2011 0600   HGB 10.7* 05/31/2011 0600   HCT 33.3* 05/31/2011 0600   PLT 183 05/31/2011 0600   MCV 94.3 05/31/2011 0600   MCH 30.3 05/31/2011 0600   MCHC 32.1 05/31/2011 0600   RDW 14.2 05/31/2011 0600   LYMPHSABS 0.9 05/31/2011 0600   MONOABS 2.1* 05/31/2011 0600   EOSABS 0.0 05/31/2011 0600   BASOSABS 0.0 05/31/2011 0600    BMET    Component Value Date/Time   NA 134* 05/31/2011 0500   K 3.4* 05/31/2011 0500   CL 88* 05/31/2011 0500   CO2 33* 05/31/2011 0500   GLUCOSE 135* 05/31/2011 0500   BUN 47* 05/31/2011 0500   CREATININE 2.82* 05/31/2011 0500   CALCIUM 8.9 05/31/2011 0500   GFRNONAA 14* 05/31/2011 0500   GFRAA 16* 05/31/2011 0500       PCXR - increased lt hemi-opacity c/w collapse Impression/Plan Patient Active Hospital Problem List: Pneumonia (05/31/2011)   Assessment:  CAP   Plan: ct IV avelox  Atelectasis (05/30/2011)   Assessment: worse - has not responded to conservative measures - vest , chest  PT etc   Plan: will proceed with bscopy - The risks of the procedure including coughing, bleeding and the small chance of lung puncture requiring chest tube were discussed in great detail. The benefits & alternatives including serial follow up were also discussed.   Acute on chronic renal insufficiency (05/31/2011)   Assessment: ? Diuretic induced   Plan: monitor Dc lovenox. Use sq heparin instead   ALVA,RAKESH V.

## 2011-05-31 NOTE — Progress Notes (Signed)
Utilization Review Complete.  [name] 05/31/2011  

## 2011-05-31 NOTE — Clinical Documentation Improvement (Signed)
RENAL FAILURE DOCUMENTATION CLARIFICATION QUERY  Please update your documentation within the medical record to reflect your response to this query.                                                                                     05/31/11  Dear Dr.Tilley and or Associates or Consultants  In a better effort to capture your patient's severity of illness, reflect appropriate length of stay and utilization of resources, a review of the patient medical record has revealed the following indicators.   Based on your clinical judgment, please clarify and document in a progress note and/or discharge summary the clinical condition associated with the following supporting information: In responding to this query please exercise your independent judgment.  The fact that a query is asked, does not imply that any particular answer is desired or expected.  "Acute on Chronic Renal Insufficiency" documented in progress notes  Please document in the progress notes and discharge summary if any condition below provides greater detail regarding the patient's renal condition during this admission:  _______Acute Renal Failure  _______Acute Kidney Injury  _______Acute on Chronic Renal Failure  _______Other Condition - please document in the progress notes and discharge summary  _______Unable to Clinically Determine     SUPPORTING CLINICAL INFORMATION  "Stage 3 CKD" per H&P  Bun/Cr/GFR    (wf) 27/1.60/no data  (admission) 23/1.32/35    (repeat admission) 47/2.82/14    (highest since admission 05/31/11)  IV Diuretics given on admission for jugular venous distention      Reviewed:query documented in progress note 06/01/11. Thank You,  Sincerely, Jerral Ralph RN, BSN, Certified Clinical Documentation Specialist: Cell - [phone] Health Information Management [place]

## 2011-05-31 NOTE — Progress Notes (Signed)
Chaplain's Note:  Visited with pt on drop-in basis.  Pt in bed alert w/ family visiting.  Chaplain familiar w/ pt from faith community.  Visited with pt and provided pastoral presence.  Pt feeling concern about upcoming surgery to repair lung issue.  Offered pastoral listening and encouragement.  Family suggested prayer, and pt accepted.  Offered to be available as needed or requested.

## 2011-06-01 ENCOUNTER — Encounter (HOSPITAL_COMMUNITY): Payer: Self-pay | Admitting: Respiratory Therapy

## 2011-06-01 ENCOUNTER — Encounter (HOSPITAL_COMMUNITY)
Admission: EM | Disposition: A | Discharge status: Rehabilitation Facility | Payer: Self-pay | Source: Ambulatory Visit | Attending: Cardiology

## 2011-06-01 ENCOUNTER — Inpatient Hospital Stay (HOSPITAL_COMMUNITY): Payer: Medicare Other

## 2011-06-01 LAB — BASIC METABOLIC PANEL
BUN: 73 mg/dL — ABNORMAL HIGH (ref 6–23)
Chloride: 90 mEq/L — ABNORMAL LOW (ref 96–112)
Creatinine, Ser: 4.9 mg/dL — ABNORMAL HIGH (ref 0.50–1.10)
GFR calc Af Amer: 8 mL/min — ABNORMAL LOW (ref >90)
GFR calc Af Amer: 8 mL/min — ABNORMAL LOW (ref >90)
GFR calc non Af Amer: 7 mL/min — ABNORMAL LOW (ref >90)
Glucose, Bld: 103 mg/dL — ABNORMAL HIGH (ref 70–99)
Potassium: 4 mEq/L (ref 3.5–5.1)
Potassium: 4.2 mEq/L (ref 3.5–5.1)

## 2011-06-01 LAB — URINALYSIS, ROUTINE W REFLEX MICROSCOPIC
Glucose, UA: NEGATIVE mg/dL
Ketones, ur: 15 mg/dL — AB
Protein, ur: NEGATIVE mg/dL

## 2011-06-01 LAB — CBC
Hemoglobin: 9.9 g/dL — ABNORMAL LOW (ref 12.0–15.0)
MCH: 30.7 pg (ref 26.0–34.0)
MCHC: 32.8 g/dL (ref 30.0–36.0)
RDW: 14.1 % (ref 11.5–15.5)

## 2011-06-01 LAB — URINE MICROSCOPIC-ADD ON

## 2011-06-01 LAB — SODIUM, URINE, RANDOM: Sodium, Ur: 14 mEq/L

## 2011-06-01 LAB — CREATININE, URINE, RANDOM: Creatinine, Urine: 201.59 mg/dL

## 2011-06-01 SURGERY — VIDEO BRONCHOSCOPY WITHOUT FLUORO
Anesthesia: Moderate Sedation | Wound class: Clean Contaminated

## 2011-06-01 MED ORDER — MIDAZOLAM HCL 10 MG/2ML IJ SOLN
10.0000 mg | Freq: Once | INTRAMUSCULAR | Status: AC
  Administered 2011-06-01 (×2): 1 mg via INTRAVENOUS

## 2011-06-01 MED ORDER — FERROUS SULFATE 325 (65 FE) MG PO TABS
325.0000 mg | ORAL_TABLET | Freq: Two times a day (BID) | ORAL | Status: DC
  Administered 2011-06-01 – 2011-06-04 (×6): 325 mg via ORAL
  Filled 2011-06-01 (×10): qty 1

## 2011-06-01 MED ORDER — PANTOPRAZOLE SODIUM 40 MG PO TBEC: 40.0000 mg | DELAYED_RELEASE_TABLET | Freq: Every day | ORAL | Status: DC

## 2011-06-01 MED ORDER — SODIUM CHLORIDE 0.9 % IV SOLN
INTRAVENOUS | Status: DC
  Administered 2011-06-01: 10 mL via INTRAVENOUS
  Administered 2011-06-01: 20:00:00 via INTRAVENOUS

## 2011-06-01 MED ORDER — MONTELUKAST SODIUM 10 MG PO TABS: 10.0000 mg | ORAL_TABLET | Freq: Every day | ORAL | Status: DC

## 2011-06-01 MED ORDER — ASPIRIN EC 81 MG PO TBEC
81.0000 mg | DELAYED_RELEASE_TABLET | ORAL | Status: DC
  Administered 2011-06-02 – 2011-06-04 (×3): 81 mg via ORAL
  Filled 2011-06-01 (×6): qty 1

## 2011-06-01 MED ORDER — ASPIRIN 81 MG PO CHEW
CHEWABLE_TABLET | ORAL | Status: AC
  Administered 2011-06-01: 81 mg
  Filled 2011-06-01: qty 1

## 2011-06-01 MED ORDER — VITAMIN D3 25 MCG (1000 UNIT) PO TABS
1000.0000 [IU] | ORAL_TABLET | Freq: Every day | ORAL | Status: DC
  Administered 2011-06-01 – 2011-06-10 (×10): 1000 [IU] via ORAL
  Filled 2011-06-01 (×15): qty 1

## 2011-06-01 MED ORDER — SIMVASTATIN 10 MG PO TABS: 5.0000 mg | ORAL_TABLET | Freq: Every day | ORAL | Status: DC

## 2011-06-01 MED ORDER — LIDOCAINE HCL 2 % IJ SOLN
INTRAMUSCULAR | Status: DC | PRN
  Administered 2011-06-01: 10 mL

## 2011-06-01 MED ORDER — DOXAZOSIN MESYLATE 8 MG PO TABS
4.0000 mg | ORAL_TABLET | Freq: Every day | ORAL | Status: DC
  Filled 2011-06-01: qty 0.5

## 2011-06-01 MED ORDER — TRAMADOL HCL 50 MG PO TABS: 50.0000 mg | ORAL_TABLET | Freq: Four times a day (QID) | ORAL | Status: DC | PRN

## 2011-06-01 MED ORDER — FENTANYL CITRATE 0.05 MG/ML IJ SOLN
100.0000 ug | Freq: Once | INTRAMUSCULAR | Status: AC
  Administered 2011-06-01 (×3): 25 ug via INTRAVENOUS

## 2011-06-01 MED ORDER — MIDAZOLAM HCL 10 MG/2ML IJ SOLN
INTRAMUSCULAR | Status: AC
  Filled 2011-06-01: qty 4

## 2011-06-01 MED ORDER — DILTIAZEM HCL ER COATED BEADS 180 MG PO CP24
180.0000 mg | ORAL_CAPSULE | Freq: Every day | ORAL | Status: DC
  Filled 2011-06-01: qty 1

## 2011-06-01 MED ORDER — LIDOCAINE HCL 1 % IJ SOLN
INTRAMUSCULAR | Status: DC | PRN
  Administered 2011-06-01: 6 mL via RESPIRATORY_TRACT

## 2011-06-01 MED ORDER — AMIODARONE HCL 200 MG PO TABS
200.0000 mg | ORAL_TABLET | ORAL | Status: DC
  Filled 2011-06-01: qty 1

## 2011-06-01 MED ORDER — THERA M PLUS PO TABS
1.0000 | ORAL_TABLET | Freq: Every day | ORAL | Status: DC
  Administered 2011-06-01 – 2011-06-04 (×4): 1 via ORAL
  Filled 2011-06-01 (×5): qty 1

## 2011-06-01 MED ORDER — FENTANYL CITRATE 0.05 MG/ML IJ SOLN
INTRAMUSCULAR | Status: AC
  Filled 2011-06-01: qty 4

## 2011-06-01 NOTE — Progress Notes (Signed)
CPT for this PM not admin.  Pt. Had bronch this AM, feel this would be a contraindication for today. Will restart CPT with vest tomorrow as ordered.

## 2011-06-01 NOTE — Progress Notes (Signed)
Pharmacy asked to review inpatient medications given acute on chronic renal failure. Scr 4.9 today giving him a crcl<36ml/min. His dose of avelox is appropriate given very little renal clearance of the drug, no other medications require dose adjustments. Thank you for the consult. 06/01/2011. [name]

## 2011-06-01 NOTE — Progress Notes (Signed)
06/01/11 0820  Vitals  Pulse Rate 69   Pulse Rate Source Monitor  ECG Heart Rate 69   Resp 17   BP 124/62 mmHg  SpO2 97 %  Oxygen Therapy  O2 Device Nasal cannula  O2 Flow Rate (L/min) 4 L/min  Pulse Oximetry Type Continuous  SpO2 Alarm Limit Low 92  (92)  LOC  Level of Consciousness Alert   Start of Procedure medication given

## 2011-06-01 NOTE — Consult Note (Signed)
Reason for Consult:AKI on CKD Referring Physician: Viann Fish, MD  [name] is an 77 y.o. female.  HPI: Pt is an 77 yo female with extensive PMH most significant for DM, HTN, CVA, CKD stage 3-4 (with baseline creat of 1.3-1.8), hx diastolic CHF, hx PE prev on coumadin but d/c recently r/t GI bleeding. Presented 11/2 with 2 weeks cough and wheezing with minimal yellow sputum. PCP sent pt to ER and found to have O2 sats 80%. Admitted by cardiology with CHF v bronchitis.  Pt was diuresed and creatinine has risen from 1.3 to 4.9.  Lasix has been discontinued and we were asked to further evaluate patients climbing creatinine.  Of note she also received a CT angio of her chest on 05/29/11 and her creat has continued to climb since the contrast.   PMH:   Past Medical History  Diagnosis Date  . Acute on chronic diastolic congestive heart failure 05/28/2011  . Hypertensive heart disease without CHF 05/28/2011  . History of pulmonary embolus (PE) 05/28/2011    2006 following pelvic fracture  . Chronic venous insufficiency 05/28/2011  . Hyperlipidemia 05/28/2011  . Gout 05/28/2011  . Asthma 05/28/2011  . History of GI diverticular bleed 05/28/2011    June 2012  . Atrial fibrillation 05/28/2011  . Chronic kidney disease stage 3 05/28/2011  . Kidney stones   . Osteoporosis   . Pulmonary hypertension 05/28/2011  . GERD (gastroesophageal reflux disease) 05/28/2011   H/o remote CVA    Secondary HPTH    Anemia of Chronic disease   . Mitral regurgitation 05/31/2011    PSH:   Past Surgical History  Procedure Date  . Partial colectomy     1999 following colonoscopy complication  . Left hip replacement     2004, complicated with dislocation  . Right hip replacement     2005  . Hernia repair   . Bilateral cataract surgery     Allergies:  Allergies  Allergen Reactions  . Lisinopril     REACTION: angioedema  . Primidone Other (See Comments)    No reaction noted  . Clonidine Derivatives  Rash    Medications:   Prior to Admission medications   Medication Sig Start Date End Date Taking? Authorizing Provider  amiodarone (PACERONE) 200 MG tablet Take 200 mg by mouth 3 (three) times a week. Take one tablet on Monday, Wednesday, and Friday    Yes Historical Provider, MD  aspirin EC 81 MG tablet Take 81 mg by mouth every morning.     Yes Historical Provider, MD  budesonide-formoterol (SYMBICORT) 80-4.5 MCG/ACT inhaler Inhale 2 puffs into the lungs 2 (two) times daily as needed. For shortness of breath    Yes Historical Provider, MD  bumetanide (BUMEX) 2 MG tablet Take 2 mg by mouth daily.     Yes Historical Provider, MD  cholecalciferol (VITAMIN D) 1000 UNITS tablet Take 1,000 Units by mouth daily.     Yes Historical Provider, MD  diltiazem (CARDIZEM CD) 180 MG 24 hr capsule Take 180 mg by mouth daily.     Yes Historical Provider, MD  doxazosin (CARDURA) 4 MG tablet Take 4 mg by mouth at bedtime.     Yes Historical Provider, MD  ferrous sulfate 325 (65 FE) MG tablet Take 325 mg by mouth 2 (two) times daily with a meal.     Yes Historical Provider, MD  Multiple Vitamins-Minerals (MULTIVITAMINS THER. W/MINERALS) TABS Take 1 tablet by mouth daily.     Yes Historical  Provider, MD  omeprazole (PRILOSEC) 20 MG capsule Take 20 mg by mouth daily.     Yes Historical Provider, MD  pravastatin (PRAVACHOL) 20 MG tablet Take 20 mg by mouth every evening.     Yes Historical Provider, MD  montelukast (SINGULAIR) 10 MG tablet Take 10 mg by mouth daily as needed. For shortness of breath     Historical Provider, MD  traMADol (ULTRAM) 50 MG tablet Take 50 mg by mouth every 6 (six) hours as needed. For muscle spasms. Maximum dose= 8 tablets per day     Historical Provider, MD    Discontinued Meds:   Medications Discontinued During This Encounter  Medication Reason  . furosemide (LASIX) injection 40 mg   . furosemide (LASIX) injection 40 mg   . amoxicillin (AMOXIL) capsule 250 mg   . doxycycline  (VIBRA-TABS) tablet 100 mg   . budesonide-formoterol (SYMBICORT) 80-4.5 MCG/ACT inhaler 1 puff   . enoxaparin (LOVENOX) injection 30 mg   . montelukast (SINGULAIR) tablet 10 mg Duplicate  . pantoprazole (PROTONIX) EC tablet 40 mg Duplicate  . diltiazem (CARDIZEM CD) 24 hr capsule 180 mg Duplicate  . doxazosin (CARDURA) tablet 4 mg Duplicate  . simvastatin (ZOCOR) tablet 5 mg Duplicate  . traMADol (ULTRAM) tablet 50 mg Duplicate  . amiodarone (PACERONE) tablet 200 mg Duplicate  . traMADol (ULTRAM) tablet 50 mg     Social History:  reports that she has never smoked. She has never used smokeless tobacco. She reports that she does not drink alcohol. Her drug history not on file.  Family History:  Father and mother deceased with h/o CVA and colon cancer, brother who died of cancer was also on dialysis  Creatinine, Ser  Date/Time Value Range Status  06/01/2011 12:03 PM 4.90* 0.50-1.10 (mg/dL) Final     RESULTS OKAYED BY BROOKE CARRAHER,RN AT 1331 06/01/11 BY ZPERRY.  05/31/2011  5:00 AM 2.82* 0.50-1.10 (mg/dL) Final     DELTA CHECK NOTED  05/30/2011  6:42 AM 1.67* 0.50-1.10 (mg/dL) Final  24/10/100  7:25 PM 1.46* 0.50-1.10 (mg/dL) Final  36/12/4401  4:74 AM 1.32* 0.50-1.10 (mg/dL) Final  25/03/5637  7:56 PM 1.32* 0.50-1.10 (mg/dL) Final  43/09/2949  8:84 PM 1.60* 0.50-1.10 (mg/dL) Final  07/31/6061 01:60 AM 1.90* 0.4-1.2 (mg/dL) Final  1/0/9323  5:57 AM 2.07* 0.4-1.2 (mg/dL) Final  09/25/2023  4:27 AM 2.27* 0.4-1.2 (mg/dL) Final  0/12/2374  2:83 AM 2.62* 0.4-1.2 (mg/dL) Final  07/31/1759  6:07 AM 2.62* 0.4-1.2 (mg/dL) Final  09/29/1060  6:94 AM 1.98* 0.4-1.2 (mg/dL) Final  02/27/4626 03:50 PM 1.62* 0.4-1.2 (mg/dL) Final  0/93/8182  9:93 AM 1.51* 0.4-1.2 (mg/dL) Final  02/07/9677  9:38 PM 1.50* 0.4-1.2 (mg/dL) Final  07/26/7508  2:58 PM 1.61* 0.4-1.2 (mg/dL) Final  52/77/8242  3:53 AM 1.26* 0.4-1.2 (mg/dL) Final  61/44/3154  0:08 AM 1.14  0.4-1.2 (mg/dL) Final  67/61/9509  3:26 AM 1.35* 0.4-1.2 (mg/dL)  Final  71/24/5809  9:83 AM 1.2  0.4-1.2 (mg/dL) Final  38/25/0539  7:67 AM 1.05  0.4-1.2 (mg/dL) Final  34/19/3790  2:40 AM 1.23* 0.4-1.2 (mg/dL) Final  97/35/3299  2:42 PM 1.3* 0.4-1.2 (mg/dL) Final  68/09/4194  2:22 PM 1.21*  Final  06/03/2007  5:00 AM 1.22*  Final  06/02/2007  2:46 PM 1.2   Final    Results for orders placed during the hospital encounter of 05/28/11 (from the past 48 hour(s))  BASIC METABOLIC PANEL     Status: Abnormal   Collection Time   05/31/11  5:00 AM  Component Value Range Comment   Sodium 134 (*) 135 - 145 (mEq/L)    Potassium 3.4 (*) 3.5 - 5.1 (mEq/L)    Chloride 88 (*) 96 - 112 (mEq/L)    CO2 33 (*) 19 - 32 (mEq/L)    Glucose, Bld 135 (*) 70 - 99 (mg/dL)    BUN 47 (*) 6 - 23 (mg/dL) DELTA CHECK NOTED   Creatinine, Ser 2.82 (*) 0.50 - 1.10 (mg/dL) DELTA CHECK NOTED   Calcium 8.9  8.4 - 10.5 (mg/dL)    GFR calc non Af Amer 14 (*) >90 (mL/min)    GFR calc Af Amer 16 (*) >90 (mL/min)   CBC     Status: Abnormal   Collection Time   05/31/11  6:00 AM      Component Value Range Comment   WBC 17.7 (*) 4.0 - 10.5 (K/uL)    RBC 3.53 (*) 3.87 - 5.11 (MIL/uL)    Hemoglobin 10.7 (*) 12.0 - 15.0 (g/dL)    HCT 04.5 (*) 40.9 - 46.0 (%)    MCV 94.3  78.0 - 100.0 (fL)    MCH 30.3  26.0 - 34.0 (pg)    MCHC 32.1  30.0 - 36.0 (g/dL)    RDW 81.1  91.4 - 78.2 (%)    Platelets 183  150 - 400 (K/uL)   DIFFERENTIAL     Status: Abnormal   Collection Time   05/31/11  6:00 AM      Component Value Range Comment   Neutrophils Relative 83 (*) 43 - 77 (%)    Neutro Abs 14.6 (*) 1.7 - 7.7 (K/uL)    Lymphocytes Relative 5 (*) 12 - 46 (%)    Lymphs Abs 0.9  0.7 - 4.0 (K/uL)    Monocytes Relative 12  3 - 12 (%)    Monocytes Absolute 2.1 (*) 0.1 - 1.0 (K/uL)    Eosinophils Relative 0  0 - 5 (%)    Eosinophils Absolute 0.0  0.0 - 0.7 (K/uL)    Basophils Relative 0  0 - 1 (%)    Basophils Absolute 0.0  0.0 - 0.1 (K/uL)   BASIC METABOLIC PANEL     Status: Abnormal   Collection  Time   06/01/11 12:03 PM      Component Value Range Comment   Sodium 135  135 - 145 (mEq/L)    Potassium 4.0  3.5 - 5.1 (mEq/L)    Chloride 90 (*) 96 - 112 (mEq/L)    CO2 31  19 - 32 (mEq/L)    Glucose, Bld 103 (*) 70 - 99 (mg/dL)    BUN 73 (*) 6 - 23 (mg/dL)    Creatinine, Ser 9.56 (*) 0.50 - 1.10 (mg/dL) RESULTS OKAYED BY BROOKE CARRAHER,RN AT 1331 06/01/11 BY ZPERRY.   Calcium 8.3 (*) 8.4 - 10.5 (mg/dL)    GFR calc non Af Amer 7 (*) >90 (mL/min)    GFR calc Af Amer 8 (*) >90 (mL/min)   CBC     Status: Abnormal   Collection Time   06/01/11 12:03 PM      Component Value Range Comment   WBC 10.5  4.0 - 10.5 (K/uL)    RBC 3.23 (*) 3.87 - 5.11 (MIL/uL)    Hemoglobin 9.9 (*) 12.0 - 15.0 (g/dL)    HCT 21.3 (*) 08.6 - 46.0 (%)    MCV 93.5  78.0 - 100.0 (fL)    MCH 30.7  26.0 - 34.0 (pg)    MCHC 32.8  30.0 - 36.0 (g/dL)    RDW 16.1  09.6 - 04.5 (%)    Platelets 182  150 - 400 (K/uL)   URINALYSIS, ROUTINE W REFLEX MICROSCOPIC     Status: Abnormal   Collection Time   06/01/11  3:47 PM      Component Value Range Comment   Color, Urine AMBER (*) YELLOW  BIOCHEMICALS MAY BE AFFECTED BY COLOR   Appearance CLOUDY (*) CLEAR     Specific Gravity, Urine 1.024  1.005 - 1.030     pH 5.0  5.0 - 8.0     Glucose, UA NEGATIVE  NEGATIVE (mg/dL)    Hgb urine dipstick MODERATE (*) NEGATIVE     Bilirubin Urine MODERATE (*) NEGATIVE     Ketones, ur 15 (*) NEGATIVE (mg/dL)    Protein, ur NEGATIVE  NEGATIVE (mg/dL)    Urobilinogen, UA 1.0  0.0 - 1.0 (mg/dL)    Nitrite NEGATIVE  NEGATIVE     Leukocytes, UA SMALL (*) NEGATIVE    SODIUM, URINE, RANDOM     Status: Normal   Collection Time   06/01/11  3:47 PM      Component Value Range Comment   Sodium, Ur 14     CREATININE, URINE, RANDOM     Status: Normal   Collection Time   06/01/11  3:47 PM      Component Value Range Comment   Creatinine, Urine 201.59     URINE MICROSCOPIC-ADD ON     Status: Normal   Collection Time   06/01/11  3:47 PM       Component Value Range Comment   Squamous Epithelial / LPF RARE  RARE     WBC, UA 0-2  <3 (WBC/hpf)    RBC / HPF 3-6  <3 (RBC/hpf)    Bacteria, UA RARE  RARE     Urine-Other AMORPHOUS URATES/PHOSPHATES       Dg Chest Portable 1 View Pl Perform In Endoscopy Suite  06/01/2011  *RADIOLOGY REPORT*  Clinical Data: Post bronchoscopy  PORTABLE CHEST - 1 VIEW  Comparison: Portable chest x-ray of 05/31/2011  Findings: Aeration of the left lung is improved after bronchoscopy. There is still opacity at the left lung base consistent with atelectasis, pneumonia, and/or left effusion.  Mild pulmonary vascular congestion is present.  The heart is mildly enlarged.  IMPRESSION: Slightly better aeration of the left lung after bronchoscopy. Persistent left basilar opacity.  Probable mild pulmonary vascular congestion.  Original Report Authenticated By: Juline Patch, M.D.   Dg Chest Portable 1 View  05/31/2011  *RADIOLOGY REPORT*  Clinical Data: Cough, chest congestion, shortness of breath  PORTABLE CHEST - 1 VIEW  Comparison: Chest x-ray of 05/28/2011 and CT angio chest of 05/29/2011  Findings: There is now complete opacification of the left hemithorax, most likely due to atelectasis with no evidence of mediastinal shift to the right.  There does appear to be cut off of the left mainstem bronchus which may be due to mucous plugging although in view of the occlusion of the left mainstem bronchus on recent CT, mass cannot be excluded.  The right lung is clear. Heart size is difficult to assess.  IMPRESSION: Complete opacification of the left hemithorax probably due to mucous plugging and/or left central endobronchial lesion.  Original Report Authenticated By: Juline Patch, M.D.   ROS: Gen:  + fatigue and weakness HEENT- no tinnitus/blurred vision/photophobia PULM- + SOB, wheezing, prod cough, no hemoptysis CVS- no CP/palps/orthopnea/PND GI- no N/V/D/hematechezia/melena or  BRBPR GU no dysuria/pyuria RHEUM- no new  arthralgias/myalgias DERM- no rashes/lumps/nodules HEME- no abnormal bleeding or bruising ENDO- no polydipsia/ polyuria/polyphagia ID- no fevers/chills All other systems, negative  Blood pressure 108/49, pulse 73, temperature 98.3 F (36.8 C), temperature source Oral, resp. rate 18, height 5\' 3"  (1.6 m), weight 68.2 kg (150 lb 5.7 oz), SpO2 93.00%. PE:  Frail, cachectic female sitting upright in bed in NAD HEENT- +erythema and edema of right eyelid, no icterus, no bruits/masses Lungs- scattered rhonchi CVS- RRR II/VI SEM at lusb Abd- +BS, soft, NT/ND Ext- trace pretib edema  Assessment/Plan: 1.  AKI/CKD- secondary to contrast induced nephropathy in setting of aggressive diuresis and underlying CKD.  Avoid more contrasted studies and follow UOP and daily Scr and electrolytes.  Pt reluctantly agreed to consider temporary/short-term HD only if life threatening pulm edema or electrolyte imbalances occur.  She does not want long term HD due to quality of life issues and seeing her brother suffer with HD. 2. CAP- on avelox s/p bronch, PCCM following 3. HTN- stable 4. P A Fib- in sinus rhythm on amiodarone 5. H/o DVT and PE- negative ct angio, vte prophylaxis 6. dispo- pt does want longterm HD   Milledge Gerding A 06/01/2011, 5:20 PM

## 2011-06-01 NOTE — Brief Op Note (Signed)
2 mg versed & 75 mcg fentnayl used in divided doses. Bronchoscope entered from the right nare. Upper airway nml Vocal cords showed nml appearance & motion. Tracha & bronchial tree examined to the subsegmental level.  Thick secretions suctioned out from Left lobe. Beyond purulent secretions the mucosa was friable & there was bleeding. Suction & pressure was used to [employer] hemostasis. Pt desaturated & required venti mask. RIght sided airways were clear of secretions but mucosa was again friable. Pt will be transferred to SDU for monitoring post provcedure

## 2011-06-01 NOTE — Progress Notes (Signed)
Device not pulling data from vital monitor.  See Shadow chart for vital signs  Intra procedure and post procedure

## 2011-06-01 NOTE — Progress Notes (Signed)
Pt transferred to 2920 via stretcher See shadow chart for vital signs

## 2011-06-01 NOTE — Progress Notes (Signed)
Lab called to report elevated creatinine level 4.0 increased from 2.8 from 05/31/11. Dr Donnie Aho was notified of new level.

## 2011-06-01 NOTE — Progress Notes (Signed)
Subjective:  Patient was moved to the intensive care in after bronchoscopy today. She had a bronchoscopy earlier today by Dr. Vassie Loll and was noted to have low oxygen saturations and was moved to the unit for monitoring. She is currently quite somnolent and difficult to arouse. Her daughter notes that she did have some pleuritic and some abdominal  and chest pain last night. She otherwise had done relatively well yesterday.   Objective:  Vital Signs in the last 24 hours: Temp:  [98.1 F (36.7 C)-99.5 F (37.5 C)] 98.1 F (36.7 C) (11/06 0443) Pulse Rate:  [69-93] 69  (11/06 0820) Cardiac Rhythm:  [-] Normal sinus rhythm (11/06 1146) Resp:  [14-20] 16  (11/06 1100) BP: (105-135)/(44-75) 105/55 mmHg (11/06 1100) SpO2:  [88 %-97 %] 94 % (11/06 1130) FiO2 (%):  [50 %] 50 % (11/06 0939)  Currently 97% Weight:  [68.2 kg (150 lb 5.7 oz)-68.7 kg (151 lb 7.3 oz)] 150 lb 5.7 oz (68.2 kg) (11/06 1130)  Physical Exam: BP Readings from Last 1 Encounters:  06/01/11 105/55    Wt Readings from Last 1 Encounters:  06/01/11 68.2 kg (150 lb 5.7 oz)    Weight change: -0.065 kg (-2.3 oz)   Somnolent appearing white female but does awaken and will answer questions at times. Lungs: Clear in the bases cardiac exam: Blowing 2/6 murmur left sternal border radiating to neck, no S3, no edema neck veins are elevated. Abdomen is soft and nontender no mass hepatosplenomegaly or aneurysm.  Intake/Output from previous day: 11/05 0701 - 11/06 0700 In: 810 [P.O.:560; IV Piggyback:250] Out: -  none recorded today   Lab Results: Results for orders placed during the hospital encounter of 05/28/11 (from the past 24 hour(s))  BASIC METABOLIC PANEL     Status: Abnormal   Collection Time   06/01/11 12:03 PM      Component Value Range   Sodium 135  135 - 145 (mEq/L)   Potassium 4.0  3.5 - 5.1 (mEq/L)   Chloride 90 (*) 96 - 112 (mEq/L)   CO2 31  19 - 32 (mEq/L)   Glucose, Bld 103 (*) 70 - 99 (mg/dL)   BUN 73 (*) 6 - 23  (mg/dL)   Creatinine, Ser 7.82 (*) 0.50 - 1.10 (mg/dL)   Calcium 8.3 (*) 8.4 - 10.5 (mg/dL)   GFR calc non Af Amer 7 (*) >90 (mL/min)   GFR calc Af Amer 8 (*) >90 (mL/min)  CBC     Status: Abnormal   Collection Time   06/01/11 12:03 PM      Component Value Range   WBC 10.5  4.0 - 10.5 (K/uL)   RBC 3.23 (*) 3.87 - 5.11 (MIL/uL)   Hemoglobin 9.9 (*) 12.0 - 15.0 (g/dL)   HCT 95.6 (*) 21.3 - 46.0 (%)   MCV 93.5  78.0 - 100.0 (fL)   MCH 30.7  26.0 - 34.0 (pg)   MCHC 32.8  30.0 - 36.0 (g/dL)   RDW 08.6  57.8 - 46.9 (%)   Platelets 182  150 - 400 (K/uL)    Assessment/Plan:   1. Pneumonia (05/31/2011)      Assessment: She is somnolent right now with low saturations. She is being seen by the pulmonologist and will be monitored intensively in the intensive care unit.    2. Acute on chronic renal failure (05/31/2011)    POA: No   Assessment: Her creatinine has almost doubled since yesterday. She received intravenous contrast when she had the chest  CT scans of this so this is likely contrast induced.     Plan: Reinsert Foley catheter.  Careful I&O Obtained a renal ultrasound, urine electrolytes and obtain nephrology consultation. D/C tramadol. 3. Mitral regurgitation (05/31/2011)    POA: Yes   Assessment: The hemodynamic status appears to be euvolemic.    4. Atrial fibrillation     Plan: She is currently in  normal sinus rhythm continue amiodarone   She is quite ill at this time and is somnolent. She was monitored intensively and we will get followup laboratory data later on tonight. Discussion with daughter at bedside.  May need a small amount of IV fluids, but will await the BNP level.  Veretta Sabourin JR,W SPENCER 06/01/2011, 1:46 PM

## 2011-06-01 NOTE — Progress Notes (Signed)
Pt o2 device changed to 50 vm at 0822

## 2011-06-01 NOTE — Progress Notes (Signed)
Physical Therapy  Pt currently off the floor for bronchoscopy. Will follow up today as time allows.

## 2011-06-01 NOTE — Progress Notes (Signed)
Pt transferred to 2920

## 2011-06-01 NOTE — Progress Notes (Signed)
   [name] is an 77 y.o. female. With bronchitis x 5 ds & lt atelectasis on CT Vitals Patient Vitals for the past 3 hrs:  BP Pulse Resp SpO2  06/01/11 0707 133/63 mmHg 72  15  94 %  ]   Intake/Output Summary (Last 24 hours) at 06/01/11 0747 Last data filed at 05/31/11 2359  Gross per 24 hour  Intake    810 ml  Output      0 ml  Net    810 ml   C/o cough, denies CP  EXAM Gen: Pleasant, well-nourished, in no distress,  normal affect  ENT: No lesions,  mouth clear,  oropharynx clear, no postnasal drip  Neck: No JVD, no TMG, no carotid bruits  Lungs: No use of accessory muscles, no dullness to percussion, decreased  On left, no rales or rhonchi  Cardiovascular: RRR, heart sounds normal, no murmur or gallops, no peripheral edema  Abdomen: soft and NT, no HSM,  BS normal  Neuro: alert, non focal  Skin: Warm, no lesions or rashes  LAB RESULTS CBC    Component Value Date/Time   WBC 17.7* 05/31/2011 0600   RBC 3.53* 05/31/2011 0600   HGB 10.7* 05/31/2011 0600   HCT 33.3* 05/31/2011 0600   PLT 183 05/31/2011 0600   MCV 94.3 05/31/2011 0600   MCH 30.3 05/31/2011 0600   MCHC 32.1 05/31/2011 0600   RDW 14.2 05/31/2011 0600   LYMPHSABS 0.9 05/31/2011 0600   MONOABS 2.1* 05/31/2011 0600   EOSABS 0.0 05/31/2011 0600   BASOSABS 0.0 05/31/2011 0600    BMET    Component Value Date/Time   NA 134* 05/31/2011 0500   K 3.4* 05/31/2011 0500   CL 88* 05/31/2011 0500   CO2 33* 05/31/2011 0500   GLUCOSE 135* 05/31/2011 0500   BUN 47* 05/31/2011 0500   CREATININE 2.82* 05/31/2011 0500   CALCIUM 8.9 05/31/2011 0500   GFRNONAA 14* 05/31/2011 0500   GFRAA 16* 05/31/2011 0500       PCXR 11/ 5- increased lt hemi-opacity c/w collapse Impression/Plan Patient Active Hospital Problem List: Pneumonia (05/31/2011)   Assessment:  CAP   Plan: ct IV avelox  Atelectasis (05/30/2011)   Assessment: worse - has not responded to conservative measures - vest , chest PT etc   Plan: will proceed  with bscopy today  - The risks of the procedure including coughing, bleeding and the small chance of lung puncture requiring chest tube were discussed in great detail. The benefits & alternatives including serial follow up were also discussed.   Acute on chronic renal insufficiency (05/31/2011)   Assessment: ? Diuretic induced   Plan: rechk BMET On  sq heparin   Everly Rubalcava V.

## 2011-06-01 NOTE — Progress Notes (Signed)
06/01/11 0837  Vitals  Pulse Rate Source Monitor  ECG Heart Rate 75   Resp 18   BP 111/60 mmHg  SpO2 93 %  Oxygen Therapy  O2 Device Venturi mask  FiO2 (%) 50 %  O2 Flow Rate (L/min) 15 L/min  Pulse Oximetry Type Continuous  SpO2 Alarm Limit Low 92    beginning of  Recovery Time

## 2011-06-02 ENCOUNTER — Inpatient Hospital Stay (HOSPITAL_COMMUNITY): Payer: Medicare Other

## 2011-06-02 DIAGNOSIS — J9819 Other pulmonary collapse: Secondary | ICD-10-CM

## 2011-06-02 DIAGNOSIS — J159 Unspecified bacterial pneumonia: Secondary | ICD-10-CM

## 2011-06-02 LAB — COMPREHENSIVE METABOLIC PANEL
Albumin: 2.7 g/dL — ABNORMAL LOW (ref 3.5–5.2)
Alkaline Phosphatase: 113 U/L (ref 39–117)
BUN: 83 mg/dL — ABNORMAL HIGH (ref 6–23)
Chloride: 91 mEq/L — ABNORMAL LOW (ref 96–112)
GFR calc Af Amer: 8 mL/min — ABNORMAL LOW (ref >90)
Glucose, Bld: 115 mg/dL — ABNORMAL HIGH (ref 70–99)
Potassium: 4.2 mEq/L (ref 3.5–5.1)
Total Bilirubin: 0.5 mg/dL (ref 0.3–1.2)

## 2011-06-02 LAB — CBC
MCH: 30 pg (ref 26.0–34.0)
MCHC: 32.2 g/dL (ref 30.0–36.0)
MCV: 93 fL (ref 78.0–100.0)
Platelets: 198 10*3/uL (ref 150–400)
RDW: 14 % (ref 11.5–15.5)

## 2011-06-02 LAB — PHOSPHORUS: Phosphorus: 6.9 mg/dL — ABNORMAL HIGH (ref 2.3–4.6)

## 2011-06-02 MED ORDER — ACETYLCYSTEINE 20 % IN SOLN
4.0000 mL | Freq: Two times a day (BID) | RESPIRATORY_TRACT | Status: AC
  Administered 2011-06-02 – 2011-06-03 (×3): 4 mL via RESPIRATORY_TRACT
  Filled 2011-06-02: qty 4
  Filled 2011-06-02 (×4): qty 10
  Filled 2011-06-02: qty 4

## 2011-06-02 NOTE — Progress Notes (Signed)
    [name] is an 77 y.o. female. With bronchitis x 5 ds & lt atelectasis on CT, s/p collapse and bronched to open. Vitals Patient Vitals for the past 3 hrs:  BP Temp Temp src Pulse SpO2  06/02/11 1256 - - - - 88 %  06/02/11 1200 116/41 mmHg - - 88  -  06/02/11 1141 - 97.5 F (36.4 C) Oral - 88 %  ]   Intake/Output Summary (Last 24 hours) at 06/02/11 1315 Last data filed at 06/02/11 0800  Gross per 24 hour  Intake    440 ml  Output    185 ml  Net    255 ml   C/o cough, denies CP  EXAM Gen: Pleasant, well-nourished, in no distress,  normal affect  ENT: No lesions, no jvd  Neck: No JVD, no TMG, no carotid bruits  Lungs: No use of accessory muscles, reduced BS left, wheezes with coughing left, egophany left base  Cardiovascular: RRR, heart sounds normal, no murmur or gallops, no peripheral edema  Abdomen: soft and NT, no HSM,  BS normal  Neuro: alert, non focal  Skin: Warm, no lesions or rashes  LAB RESULTS CBC    Component Value Date/Time   WBC 7.5 06/02/2011 0635   RBC 3.27* 06/02/2011 0635   HGB 9.8* 06/02/2011 0635   HCT 30.4* 06/02/2011 0635   PLT 198 06/02/2011 0635   MCV 93.0 06/02/2011 0635   MCH 30.0 06/02/2011 0635   MCHC 32.2 06/02/2011 0635   RDW 14.0 06/02/2011 0635   LYMPHSABS 0.9 05/31/2011 0600   MONOABS 2.1* 05/31/2011 0600   EOSABS 0.0 05/31/2011 0600   BASOSABS 0.0 05/31/2011 0600    BMET    Component Value Date/Time   NA 135 06/02/2011 0635   K 4.2 06/02/2011 0635   CL 91* 06/02/2011 0635   CO2 32 06/02/2011 0635   GLUCOSE 115* 06/02/2011 0635   BUN 83* 06/02/2011 0635   CREATININE 5.15* 06/02/2011 0635   CALCIUM 8.5 06/02/2011 0635   GFRNONAA 7* 06/02/2011 0635   GFRAA 8* 06/02/2011 0635       PCXR 11/ 5- increased lt hemi-opacity c/w collapse Impression/Plan Patient Active Hospital Problem List: Pneumonia (05/31/2011)   Assessment:  CAP with collapse left   Plan: no fevers, if spike, will alter abx regimen to ceftaz, vanc Still  with reduced sounds, repeat pcxr now, continue IBPA, consider true NIMV if recollapse occurs IS when able Re add chst pt Add mucomysts with neb Clinically appears well, but this may be a few days to keep LLL open  May need NIMV scheduled in daytime, will eval PCXR now  Acute on chronic renal insufficiency (05/31/2011)   Assessment: per renal, thinks plat of crt and fxn, not overloaded   FEINSTEIN,DANIEL J.

## 2011-06-02 NOTE — Progress Notes (Signed)
Chaplain's note:  Follow-up visit with pt.  Pt sitting up in chair.  Pt said minister from church had just visited before lunch.  Provided pastoral presence and encouragement.  Pt didn't know what plan of treatment was specifically.  Pt doesn't like being in hospital, but not participates with care well.  Will follow-up as requested or needed.

## 2011-06-02 NOTE — Plan of Care (Signed)
Problem: Consults Goal: Heart Failure Patient Education (See Patient Education module for education specifics.)  Outcome: Progressing chf teaching discussed, monitoring daily weights and fluid overload discussed

## 2011-06-02 NOTE — Progress Notes (Signed)
Subjective:   No complaints of SOB, still coughing, but not as much sputum production.  No c/o chest pain or abdominal pain, appetite is fair.   Objective:  Vital Signs in the last 24 hours: Temp:  [97.9 F (36.6 C)-98.3 F (36.8 C)] 97.9 F (36.6 C) (11/07 0829) Pulse Rate:  [47-81] 81  (11/07 0800) Cardiac Rhythm:  [-] Normal sinus rhythm (11/06 2000) Resp:  [11-25] 22  (11/07 0800) BP: (87-135)/(33-67) 87/67 mmHg (11/07 0800) SpO2:  [82 %-99 %] 85 % (11/07 0800) FiO2 (%):  [50 %] 50 % (11/06 0939) Weight:  [68.2 kg (150 lb 5.7 oz)] 150 lb 5.7 oz (68.2 kg) (11/06 1130)  Physical Exam: BP Readings from Last 1 Encounters:  06/02/11 87/67    Wt Readings from Last 1 Encounters:  06/01/11 68.2 kg (150 lb 5.7 oz)    Weight change: -0.5 kg (-1 lb 1.6 oz)  BP 87/67  Pulse 81  Temp(Src) 97.9 F (36.6 C) (Oral)  Resp 22  Ht 5\' 3"  (1.6 m)  Wt 68.2 kg (150 lb 5.7 oz)  BMI 26.63 kg/m2  SpO2 85%  Lungs:  Rhonchi bilaterally, no rales Cardiac:  Regular rhythm, normal S1 and S2, no S3, 2/6 systolic murmur of MR Abdomen:  Soft, nontender, no masses Extremities:  No edema present  Intake/Output from previous day: 11/06 0701 - 11/07 0700 In: 440 [P.O.:440] Out: 110 [Urine:110]  Lab Results:   Collection Time   06/02/11  6:35 AM      Component Value Range   WBC 7.5  4.0 - 10.5 (K/uL)   RBC 3.27 (*) 3.87 - 5.11 (MIL/uL)   Hemoglobin 9.8 (*) 12.0 - 15.0 (g/dL)   HCT 16.1 (*) 09.6 - 46.0 (%)   MCV 93.0  78.0 - 100.0 (fL)   MCH 30.0  26.0 - 34.0 (pg)   MCHC 32.2  30.0 - 36.0 (g/dL)   RDW 04.5  40.9 - 81.1 (%)   Platelets 198  150 - 400 (K/uL)  COMPREHENSIVE METABOLIC PANEL     Status: Abnormal   Collection Time   06/02/11  6:35 AM      Component Value Range   Sodium 135  135 - 145 (mEq/L)   Potassium 4.2  3.5 - 5.1 (mEq/L)   Chloride 91 (*) 96 - 112 (mEq/L)   CO2 32  19 - 32 (mEq/L)   Glucose, Bld 115 (*) 70 - 99 (mg/dL)   BUN 83 (*) 6 - 23 (mg/dL)   Creatinine, Ser 9.14  (*) 0.50 - 1.10 (mg/dL)   Calcium 8.5  8.4 - 78.2 (mg/dL)   Total Protein 6.1  6.0 - 8.3 (g/dL)   Albumin 2.7 (*) 3.5 - 5.2 (g/dL)   AST 31  0 - 37 (U/L)   ALT 20  0 - 35 (U/L)   Alkaline Phosphatase 113  39 - 117 (U/L)   Total Bilirubin 0.5  0.3 - 1.2 (mg/dL)   GFR calc non Af Amer 7 (*) >90 (mL/min)   GFR calc Af Amer 8 (*) >90 (mL/min)  PHOSPHORUS     Status: Abnormal   Collection Time   06/02/11  6:35 AM      Component Value Range   Phosphorus 6.9 (*) 2.3 - 4.6 (mg/dL)    Assessment/Plan:  Pneumonia (05/31/2011)    POA: Yes   Assessment:  Clinically is better, but still dyspneic   Plan: per pulmonary Acute on chronic renal failure    Plan: Creatinine is still rising,  but rate is slowing, appreciate renal help Hypertensive heart disease without CHF (05/28/2011)    POA: Yes   Plan: Watch BP and avoid hypotension to reduce further renal insult  She is clinically better, but still very sick.  Will let her get up in the chair at the bedside, keep in unit for now.  Avoid any nephrotoxic drugs.    LOS: 5 days    TILLEY JR,W SPENCER 06/02/2011, 9:13 AM

## 2011-06-02 NOTE — Consult Note (Signed)
S:"I think I feel a little better"  Still has junky cough.  Some SOB with exertion; denies nausea or vomiting  O:Very nice elderly WF  Coughing but o/w NAD  BP 114/51  Pulse 70  Temp(Src) 98.1 F (36.7 C) (Oral)  Resp 15  Ht 5\' 3"  (1.6 m)  Wt 68.2 kg (150 lb 5.7 oz)  BMI 26.63 kg/m2  SpO2 96%   Intake/Output Summary (Last 24 hours) at 06/02/11 0751 Last data filed at 06/01/11 1800  Gross per 24 hour  Intake    440 ml  Output     80 ml  Net    360 ml  Additional 50 ml in foley bag since 7AM and clearing some  Weight change: -0.5 kg (-1 lb 1.6 oz) Scheduled Meds:   . albuterol  2.5 mg Nebulization Q4H  . amiodarone  200 mg Oral Q M,W,F  . aspirin      . aspirin EC  81 mg Oral QAM  . cholecalciferol  1,000 Units Oral Daily  . diltiazem  180 mg Oral Daily  . doxazosin  4 mg Oral QHS  . fentaNYL  100 mcg Intravenous Once  . ferrous sulfate  325 mg Oral BID WC  . heparin subcutaneous  5,000 Units Subcutaneous Q8H  . midazolam  10 mg Intravenous Once  . montelukast  10 mg Oral QHS  . moxifloxacin  400 mg Intravenous Q24H  . multivitamins ther. w/minerals  1 tablet Oral Daily  . pantoprazole  40 mg Oral Q1200  . pravastatin  20 mg Oral q1800  . DISCONTD: amiodarone  200 mg Oral 3 times weekly  . DISCONTD: diltiazem  180 mg Oral Daily  . DISCONTD: doxazosin  4 mg Oral QHS  . DISCONTD: montelukast  10 mg Oral QHS  . DISCONTD: pantoprazole  40 mg Oral Q1200  . DISCONTD: simvastatin  5 mg Oral q1800   Continuous Infusions:   . sodium chloride 10 mL (06/01/11 2118)   PRN Meds:.benzonatate, lidocaine, lidocaine, DISCONTD: traMADol, DISCONTD: traMADol ZOX:WRUEA elderly woman.  Right eyelid with some hemorrhage and eversion VWU:[id number] No S3.  2/3 murmur USB  No rub Resp:Bilateral rhonchous lung sounds BJY:NWGN without focal tenderness FAO:ZHYQMVHQ dysesthetic but only trace pitting edema Foley: dark but clearing urine  Basic Metabolic Panel:  Basename 06/02/11 0635  06/01/11 1746  NA 135 135  K 4.2 4.2  CL 91* 90*  CO2 32 28  GLUCOSE 115* 127*  BUN 83* 76*  CREATININE 5.15* 4.98*  CALCIUM 8.5 8.5  MG -- --  PHOS 6.9* --   Liver Function Tests:  [facility] 06/02/11 0635  AST 31  ALT 20  ALKPHOS 113  BILITOT 0.5  PROT 6.1  ALBUMIN 2.7*   CBC:  Basename 06/02/11 0635 06/01/11 1203 05/31/11 0600  WBC 7.5 10.5 --  NEUTROABS -- -- 14.6*  HGB 9.8* 9.9* --  HCT 30.4* 30.2* --  MCV 93.0 93.5 --  PLT 198 182 --   Anemia Panel: No results found for this basename: VITAMINB12,FOLATE,FERRITIN,TIBC,IRON,RETICCTPCT in the last 72 hours   US Renal  06/02/2011  *RADIOLOGY REPORT*  Clinical Data: Renal failure.  RENAL/URINARY TRACT ULTRASOUND COMPLETE  Comparison:  None.  Findings:  Right Kidney:  Measures 10.5 cm.  There is a 1.7 cm anechoic cyst. The kidney is echogenic.  No hydronephrosis.  Left Kidney:  Increased echogenicity without focal lesion.  The kidney measures 9.9 cm.  Bladder:  Decompressed with a Foley catheter balloon in place.  Incidental note  is made of a small left pleural effusion.  IMPRESSION: Echogenic kidneys in keeping with medical renal disease.  No hydronephrosis.  1.7 cm right renal cyst.  Original Report Authenticated By: Waneta Martins, M.D.   Dg Chest Portable 1 View Pl Perform In Endoscopy Suite  06/01/2011  *RADIOLOGY REPORT*  Clinical Data: Post bronchoscopy  PORTABLE CHEST - 1 VIEW  Comparison: Portable chest x-ray of 05/31/2011  Findings: Aeration of the left lung is improved after bronchoscopy. There is still opacity at the left lung base consistent with atelectasis, pneumonia, and/or left effusion.  Mild pulmonary vascular congestion is present.  The heart is mildly enlarged.  IMPRESSION: Slightly better aeration of the left lung after bronchoscopy. Persistent left basilar opacity.  Probable mild pulmonary vascular congestion.  Original Report Authenticated By: Juline Patch, M.D.   Assessment/Plan:  1. AKI on CKD.   Creatinine up to 5.15 but rate of rise clearly slower,  urine visibly clearer in foley, and outtput in the last hour appears     to be increasing some.  Fractional sodium excretion calculates to     less than 1%  Picture all fits with contrast induced nephropathy, in setting of diuresis and acute illness (PNA) in pt with underlying CKD. (kidneys echodense in keeping with dx of CKD).   Feel she will plateau in the next day or so.  No dialysis indications at this time.  Careful monitoring of I/O.  2. Left sided CAP - s/p bronch; on Avelox 3. HTN - BP is a little soft this AM; she is on dilt/doxazisin - would hold if SBP under 100  4. H/O DVT/PE - no evidence for PD this admit 5. Anemia - HB 12.9 down to 9.8 since 11/2 with prior H/O GIB (diverticular) .Watch for further drop.   5. Dispo - watchful waiting, avoid further contrast/hypotension/etc.  Jonika Critz B

## 2011-06-02 NOTE — Progress Notes (Signed)
Physical Therapy Evaluation Patient Details Name: [name] MRN: [id number] DOB: Sep 17, 1921 Today's Date: 06/02/2011  Problem List:  Patient Active Problem List  Diagnoses  . STROKE  . Hypertensive heart disease without CHF  . History of pulmonary embolus (PE)  . Chronic venous insufficiency  . Gout  . Asthma  . History of GI diverticular bleed  . Atrial fibrillation  . Chronic kidney disease stage 3  . Pulmonary hypertension  . GERD   . Bronchitis  . Atelectasis  . Pneumonia  . Acute on chronic renal failure  . Mitral regurgitation    Past Medical History:  Past Medical History  Diagnosis Date  . Acute on chronic diastolic congestive heart failure 05/28/2011  . Hypertensive heart disease without CHF 05/28/2011  . History of pulmonary embolus (PE) 05/28/2011    2006 following pelvic fracture  . Chronic venous insufficiency 05/28/2011  . Hyperlipidemia 05/28/2011  . Gout 05/28/2011  . Asthma 05/28/2011  . History of GI diverticular bleed 05/28/2011    June 2012  . Atrial fibrillation 05/28/2011  . Chronic kidney disease stage 3 05/28/2011  . Kidney stones   . Osteoporosis   . Pulmonary hypertension 05/28/2011  . GERD (gastroesophageal reflux disease) 05/28/2011  . Mitral regurgitation 05/31/2011  . Secondary hyperparathyroidism (of renal origin)   . Anemia of chronic disease   . History of CVA (cerebrovascular accident)    Past Surgical History:  Past Surgical History  Procedure Date  . Partial colectomy     1999 following colonoscopy complication  . Left hip replacement     2004, complicated with dislocation  . Right hip replacement     2005  . Hernia repair   . Bilateral cataract surgery     PT Assessment/Plan/Recommendation PT Assessment Clinical Impression Statement: Pt is a pleasant 77 year old female with persistent respiratory problems which have been difficult to manage medically. Given medical difficulties pt has been relatively immobile with  resultant generalized weakness, decreased activity tolerance and associated balance deficits,. Will benefit from acute physical therapy for strengthening and balance training. Rec. HHPT on d/c. Likely pt will need to use RW at home and needs increased help from family.  PT Recommendation/Assessment: Patient will need skilled PT in the acute care venue PT Problem List: Decreased strength;Decreased activity tolerance;Decreased balance;Decreased mobility;Decreased knowledge of use of DME;Cardiopulmonary status limiting activity PT Therapy Diagnosis : Difficulty walking;Abnormality of gait;Generalized weakness PT Plan PT Frequency: Min 3X/week PT Treatment/Interventions: DME instruction;Gait training;Stair training;Functional mobility training;Therapeutic exercise;Balance training;Neuromuscular re-education;Patient/family education PT Recommendation Recommendations for Other Services: OT consult Follow Up Recommendations: Home health PT;24 hour supervision/assistance PT Goals  Acute Rehab PT Goals PT Goal Formulation: With patient Time For Goal Achievement: 2 weeks Pt will go Supine/Side to Sit: with modified independence PT Goal: Supine/Side to Sit - Progress: Progressing toward goal Pt will go Sit to Supine/Side: with modified independence PT Goal: Sit to Supine/Side - Progress: Progressing toward goal Pt will Transfer Sit to Stand/Stand to Sit: with supervision PT Transfer Goal: Sit to Stand/Stand to Sit - Progress: Progressing toward goal Pt will Transfer Bed to Chair/Chair to Bed: with supervision PT Transfer Goal: Bed to Chair/Chair to Bed - Progress: Progressing toward goal Pt will Ambulate: 51 - 150 feet;with least restrictive assistive device;with supervision PT Goal: Ambulate - Progress: Progressing toward goal Pt will Go Up / Down Stairs: 1-2 stairs;with least restrictive assistive device PT Goal: Up/Down Stairs - Progress: Progressing toward goal Pt will Perform Home Exercise  Program:  Independently PT Goal: Perform Home Exercise Program - Progress: Progressing toward goal  PT Evaluation Precautions/Restrictions    Prior Functioning  Home Living Lives With: Daughter (2 grandsons) Receives Help From: Family Type of Home: House Home Layout: One level Home Access: Stairs to enter Entrance Stairs-Rails: None Entrance Stairs-Number of Steps: 1 Bathroom Shower/Tub: Engineer, manufacturing systems: Standard Home Adaptive Equipment: Straight cane;Bedside commode/3-in-1;Walker - rolling Additional Comments: was using only the [facility] before admission  Prior Function Level of Independence: Independent with basic ADLs;Independent with transfers;Requires assistive device for independence Driving: No Vocation: Unemployed Cognition Cognition Arousal/Alertness: Awake/alert Overall Cognitive Status: Appears within functional limits for tasks assessed Orientation Level: Oriented X4 Sensation/Coordination Sensation Light Touch: Appears Intact Extremity Assessment RUE Strength RUE Overall Strength Comments: Grossly 3-4/5  LUE Strength LUE Overall Strength: Deficits (generalized weakness secondary to prolonged bedrest) LUE Overall Strength Comments: Pt with grossly 3-4/5 strength RLE Strength RLE Overall Strength: Deficits (gen. weakness secondary to prolonged bed rest) RLE Overall Strength Comments: grossly 3-4/5 LLE Strength LLE Overall Strength Comments: grossly 3-4/5 with decreased activity tolerance secondary to prolonged bed rest/sickness/immobility Mobility (including Balance) Bed Mobility Bed Mobility: Yes Supine to Sit: 3: Mod assist Supine to Sit Details (indicate cue type and reason): HOB elevated 30 degrees, pt with increased effort for trunk elevation and scooting in bed, facilitation and use of pad to assist with scooting and sitting upright  Sitting - Scoot to Edge of Bed: 3: Mod assist Sitting - Scoot to Edge of Bed Details (indicate cue type and  reason): use of pad to scoot hips EOB Transfers Transfers: Yes Sit to Stand: 4: Min assist;From elevated surface Sit to Stand Details (indicate cue type and reason): facilitation for follow through and to steady self once in standing as pt falling posteriorly, cues for use of RW to steady self  Stand to Sit: 4: Min assist Stand to Sit Details: min facilitation to assist with controlled descent  Stand Pivot Transfers: 4: Min assist Stand Pivot Transfer Details (indicate cue type and reason): v/c's and t/c's to maintain steady on feed during stepping to left to sit bed to recliner  Ambulation/Gait Ambulation/Gait: No Stairs: No  Posture/Postural Control Posture/Postural Control: Postural limitations Postural Limitations: pt with flexed trunk, forward head and rounded shoulders  Balance Balance Assessed:  (NT fully, RW in eval, noticeable post lean in standing) Exercise  Total Joint Exercises Ankle Circles/Pumps: AROM;Strengthening;Both;5 reps;Supine Heel Slides: AROM;Both;5 reps;Supine End of Session PT - End of Session Equipment Utilized During Treatment: Gait belt Activity Tolerance: Treatment limited secondary to medical complications (Comment);Patient limited by fatigue (pt with multiple productive coughing spells ) Patient left: in chair Nurse Communication: Mobility status for transfers General Behavior During Session: [facility] for tasks performed Cognition: [facility] for tasks performed  [facility] HELEN 06/02/2011, 1:15 PM

## 2011-06-02 NOTE — Plan of Care (Signed)
Problem: Phase I Progression Outcomes Goal: Up in chair, BRP Outcome: Progressing Up in chair

## 2011-06-03 ENCOUNTER — Inpatient Hospital Stay (HOSPITAL_COMMUNITY): Payer: Medicare Other

## 2011-06-03 DIAGNOSIS — J159 Unspecified bacterial pneumonia: Secondary | ICD-10-CM

## 2011-06-03 DIAGNOSIS — J9819 Other pulmonary collapse: Secondary | ICD-10-CM

## 2011-06-03 LAB — IRON AND TIBC
Iron: 34 ug/dL — ABNORMAL LOW (ref 42–135)
Saturation Ratios: 15 % — ABNORMAL LOW (ref 20–55)
TIBC: 227 ug/dL — ABNORMAL LOW (ref 250–470)
UIBC: 193 ug/dL (ref 125–400)

## 2011-06-03 LAB — CBC
HCT: 30.4 % — ABNORMAL LOW (ref 36.0–46.0)
Hemoglobin: 9.9 g/dL — ABNORMAL LOW (ref 12.0–15.0)
MCH: 29.9 pg (ref 26.0–34.0)
MCHC: 32.6 g/dL (ref 30.0–36.0)
MCV: 91.8 fL (ref 78.0–100.0)
Platelets: 226 K/uL (ref 150–400)
RBC: 3.31 MIL/uL — ABNORMAL LOW (ref 3.87–5.11)
RDW: 13.9 % (ref 11.5–15.5)
WBC: 7.6 K/uL (ref 4.0–10.5)

## 2011-06-03 LAB — RENAL FUNCTION PANEL
Albumin: 2.8 g/dL — ABNORMAL LOW (ref 3.5–5.2)
BUN: 87 mg/dL — ABNORMAL HIGH (ref 6–23)
CO2: 26 meq/L (ref 19–32)
Calcium: 8.5 mg/dL (ref 8.4–10.5)
Chloride: 90 meq/L — ABNORMAL LOW (ref 96–112)
Creatinine, Ser: 5.13 mg/dL — ABNORMAL HIGH (ref 0.50–1.10)
GFR calc Af Amer: 8 mL/min — ABNORMAL LOW
GFR calc non Af Amer: 7 mL/min — ABNORMAL LOW
Glucose, Bld: 120 mg/dL — ABNORMAL HIGH (ref 70–99)
Phosphorus: 6.5 mg/dL — ABNORMAL HIGH (ref 2.3–4.6)
Potassium: 4 meq/L (ref 3.5–5.1)
Sodium: 135 meq/L (ref 135–145)

## 2011-06-03 MED ORDER — CEFTAZIDIME 1 G IJ SOLR
1.0000 g | Freq: Three times a day (TID) | INTRAMUSCULAR | Status: DC
  Administered 2011-06-03 – 2011-06-04 (×3): 1 g via INTRAVENOUS
  Filled 2011-06-03 (×4): qty 1

## 2011-06-03 MED ORDER — TRAMADOL HCL 50 MG PO TABS
50.0000 mg | ORAL_TABLET | Freq: Four times a day (QID) | ORAL | Status: DC | PRN
  Administered 2011-06-03: 50 mg via ORAL
  Filled 2011-06-03 (×2): qty 1

## 2011-06-03 MED ORDER — VANCOMYCIN HCL IN DEXTROSE 1-5 GM/200ML-% IV SOLN
1000.0000 mg | Freq: Two times a day (BID) | INTRAVENOUS | Status: DC
  Administered 2011-06-03 – 2011-06-04 (×2): 1000 mg via INTRAVENOUS
  Filled 2011-06-03 (×3): qty 200

## 2011-06-03 NOTE — Progress Notes (Signed)
  Subjective:   Complains of mild dyspnea.  No chest pain.  Has been in sinus rhythm overnight with PAC's.  Still coughing.  Appetite fair.  Objective:  Vital Signs in the last 24 hours: Temp:  [97.5 F (36.4 C)-98.3 F (36.8 C)] 98.1 F (36.7 C) (11/08 0400) Pulse Rate:  [64-88] 77  (11/08 0600) Cardiac Rhythm:  [-] Normal sinus rhythm (11/08 0400) Resp:  [14-25] 17  (11/08 0600) BP: (87-124)/(41-87) 107/87 mmHg (11/08 0400) SpO2:  [88 %-97 %] 94 % (11/08 0600) Weight:  [70 kg (154 lb 5.2 oz)] 154 lb 5.2 oz (70 kg) (11/08 0500)  Physical Exam: BP Readings from Last 1 Encounters:  06/03/11 107/87    Wt Readings from Last 1 Encounters:  06/03/11 70 kg (154 lb 5.2 oz)    Weight change: 1.8 kg (3 lb 15.5 oz)   Lungs:  Clear anteriorly, rales left posterior base  Cardiac:  Regular rhythm, normal S1 and S2, 2/6 systolic murmur, JVD elevated  Abdomen:  Soft, nontender, no masses  Extremities:  No edema present  Intake/Output from previous day: 11/07 0701 - 11/08 0700 In: 730 [P.O.:480; IV Piggyback:250] Out: 775 [Urine:775]  Lab Results: BMET  ( not back from today yet)    Component Value Date/Time   NA 135 06/02/2011 0635   K 4.2 06/02/2011 0635   CL 91* 06/02/2011 0635   CO2 32 06/02/2011 0635   GLUCOSE 115* 06/02/2011 0635   BUN 83* 06/02/2011 0635   CREATININE 5.15* 06/02/2011 0635   CALCIUM 8.5 06/02/2011 0635   GFRNONAA 7* 06/02/2011 0635   GFRAA 8* 06/02/2011 0635   CBC    Component Value Date/Time   WBC 7.6 06/03/2011 0600   RBC 3.31* 06/03/2011 0600   HGB 9.9* 06/03/2011 0600   HCT 30.4* 06/03/2011 0600   PLT 226 06/03/2011 0600   MCV 91.8 06/03/2011 0600   MCH 29.9 06/03/2011 0600   MCHC 32.6 06/03/2011 0600   RDW 13.9 06/03/2011 0600   LYMPHSABS 0.9 05/31/2011 0600   MONOABS 2.1* 05/31/2011 0600   EOSABS 0.0 05/31/2011 0600   BASOSABS 0.0 05/31/2011 0600    Assessment/Plan:  Pneumonia    Assessment:  Still coughing and having a bit of sputum production, was  transferred to CCM yesterday Acute on chronic renal failure    Assessment: BMP pending from today, urine output has picked up since yesterday Atrial fibrillation   Assessment: in sinus with PAC's   Plan:continue amio    LOS: 6 days    [name].  MD [facility] 06/03/2011, 8:11 AM

## 2011-06-03 NOTE — Progress Notes (Signed)
Pt also had breathing treatment.Started CPT with Chest vest 5 Hzs power of 3 pt heart rate went 214 . I then turned off CPT

## 2011-06-03 NOTE — Progress Notes (Signed)
    [name] is an 77 y.o. female. With bronchitis x 5 ds & lt atelectasis on CT, s/p collapse and bronched to open. Vitals Patient Vitals for the past 3 hrs:  BP Temp Temp src Pulse Resp SpO2  06/03/11 1200 126/74 mmHg 97.4 F (36.3 C) Oral 80  18  91 %  ]   Intake/Output Summary (Last 24 hours) at 06/03/11 1406 Last data filed at 06/03/11 0600  Gross per 24 hour  Intake    250 ml  Output    700 ml  Net   -450 ml   C/o cough, denies CP  EXAM Gen: Pleasant, well-nourished, in no distress  ENT: No lesions, no jvd  Neck: No JVD, no TMG, no carotid bruits  Lungs: No use of accessory muscles, reduced left , egophany  Cardiovascular: RRR, heart sounds normal, no murmur or gallops, no peripheral edema  Abdomen: soft and NT, no HSM,  BS normal  Neuro: alert, non focal  Skin: Warm, no lesions or rashes  LAB RESULTS CBC    Component Value Date/Time   WBC 7.6 06/03/2011 0600   RBC 3.31* 06/03/2011 0600   HGB 9.9* 06/03/2011 0600   HCT 30.4* 06/03/2011 0600   PLT 226 06/03/2011 0600   MCV 91.8 06/03/2011 0600   MCH 29.9 06/03/2011 0600   MCHC 32.6 06/03/2011 0600   RDW 13.9 06/03/2011 0600   LYMPHSABS 0.9 05/31/2011 0600   MONOABS 2.1* 05/31/2011 0600   EOSABS 0.0 05/31/2011 0600   BASOSABS 0.0 05/31/2011 0600    BMET    Component Value Date/Time   NA 135 06/03/2011 0600   K 4.0 06/03/2011 0600   CL 90* 06/03/2011 0600   CO2 26 06/03/2011 0600   GLUCOSE 120* 06/03/2011 0600   BUN 87* 06/03/2011 0600   CREATININE 5.13* 06/03/2011 0600   CALCIUM 8.5 06/03/2011 0600   GFRNONAA 7* 06/03/2011 0600   GFRAA 8* 06/03/2011 0600       PCXR 11/ 5- increased lt hemi-opacity c/w collapse Impression/Plan Patient Active Hospital Problem List: Pneumonia (05/31/2011)   Assessment:  CAP with collapse left   Plan: no fevers, but no clinical progress Dc avleox, add vanc, ceftaz Still with reduced sounds, pcxr left collapse Re add chst pt, to percussion Contiunue mucomysts with  neb Clinically appears well, mandatory NIMV now and tonight Will plan bronch at noon Friday if unable to open again  Acute on chronic renal insufficiency (05/31/2011)   Assessment: per renal, plat crt noted, output increased Chem in am    [name].  Likely needs repeat bronch see above

## 2011-06-03 NOTE — Progress Notes (Signed)
S: Coughing quite a bit and brining up yellow phlegm Did not tolerate BIPAP last night (hurt her head) SOB when she moves around  O:Coughing but otherwise NAD  BP 127/46  Pulse 73  Temp(Src) 98 F (36.7 C) (Oral)  Resp 18  Ht 5\' 3"  (1.6 m)  Wt 70 kg (154 lb 5.2 oz)  BMI 27.34 kg/m2  SpO2 96%   Intake/Output Summary (Last 24 hours) at 06/03/11 0918 Last data filed at 06/03/11 0600  Gross per 24 hour  Intake    730 ml  Output    700 ml  Net     30 ml    Weight change: 1.8 kg (3 lb 15.5 oz) Scheduled Meds:   . acetylcysteine  4 mL Nebulization BID  . albuterol  2.5 mg Nebulization Q4H  . amiodarone  200 mg Oral Q M,W,F  . aspirin EC  81 mg Oral QAM  . cholecalciferol  1,000 Units Oral Daily  . diltiazem  180 mg Oral Daily  . doxazosin  4 mg Oral QHS  . ferrous sulfate  325 mg Oral BID WC  . heparin subcutaneous  5,000 Units Subcutaneous Q8H  . montelukast  10 mg Oral QHS  . moxifloxacin  400 mg Intravenous Q24H  . multivitamins ther. w/minerals  1 tablet Oral Daily  . pantoprazole  40 mg Oral Q1200  . pravastatin  20 mg Oral q1800   Continuous Infusions:   . sodium chloride 10 mL/hr at 06/03/11 0700   PRN Meds:.benzonatate, lidocaine, lidocaine  VHQ:IONGE WF; coughing HEENT: everted right eyelid CVS:S1Sr no S3 Resp:Coarse rhonchi left chest; right side fairly clear XBM:WUXL Ext:no significant edema  Basic Metabolic Panel:  Basename 06/03/11 0600 06/02/11 0635  NA 135 135  K 4.0 4.2  CL 90* 91*  CO2 26 32  GLUCOSE 120* 115*  BUN 87* 83*  CREATININE 5.13* 5.15*  CALCIUM 8.5 8.5  MG -- --  PHOS 6.5* 6.9*   Liver Function Tests:  Basename 06/03/11 0600 06/02/11 0635  AST -- 31  ALT -- 20  ALKPHOS -- 113  BILITOT -- 0.5  PROT -- 6.1  ALBUMIN 2.8* 2.7*   CBC:  Basename 06/03/11 0600 06/02/11 0635  WBC 7.6 7.5  NEUTROABS -- --  HGB 9.9* 9.8*  HCT 30.4* 30.4*  MCV 91.8 93.0  PLT 226 198   Anemia Panel: No results found for this basename:  VITAMINB12,FOLATE,FERRITIN,TIBC,IRON,RETICCTPCT in the last 72 hours  *RADIOLOGY REPORT*  Clinical Data: Atelectasis, shortness of breath.  PORTABLE CHEST - 1 VIEW  Comparison: 06/02/2011  Findings: Since the prior study, there is complete whiteout of the  left hemithorax. This suggests progression of atelectasis and  effusion versus increased layering given the differences in  positioning. Cardiomediastinal contours deviate into the left  hemithorax. Right lung is without significant interval change.  Mild right lung base opacity. Aortic arch atherosclerotic  calcification. No acute osseous abnormality.  IMPRESSION:  Opacification of the left hemithorax, in keeping with atelectasis  and pleural effusion.  Original Report Authenticated By: Waneta Martins, M.D.  Assessment/Plan: 1. AKI on CKD. Creatinine  5.13 and has clearly plateaued and urine output has increased. Picture still all fits with contrast induced nephropathy, in setting of diuresis and acute illness (PNA) in pt with underlying CKD.  No indications for acute dialysis.  Hopefully will see creatinine start to fall in the next couple of days 2. Left sided CAP - s/p bronch; on Avelox; PULM/CCM following (post bronch)  3.  HTN - BP less soft today; systolic acceptable 4. H/O DVT/PE - no evidence for PD this admit  5. Anemia - HB 12.9 down to 9.8 - today 9.9 - stable past 2 days (with prior H/O GIB (diverticular) Watch for further drop).  5. Dispo - watchful waiting, avoid further contrast/hypotension/etc.      [name]

## 2011-06-03 NOTE — Progress Notes (Signed)
Chaplain's Note:  Follow-up check-in with pt.  Pt awake and alert, having coughing.  Provided pastoral care for pt and company.  Pt trying to get over pneumonia.  Pt appreciates the attentive and regular visits of family and church ministers.  Pt thanked chaplain for visit. Will follow-up as needed or requested.

## 2011-06-03 NOTE — Progress Notes (Signed)
eLink Physician-Brief Progress Note Patient Name: [name] DOB: Feb 16, 1922 MRN: [id number]  Date of Service  06/03/2011   HPI/Events of Note   Patient having flare of chronic back pain. Home ultram not on current med list per RN  eICU Interventions  Ultram prn       Kaysie Michelini 06/03/2011, 6:59 PM

## 2011-06-04 ENCOUNTER — Inpatient Hospital Stay (HOSPITAL_COMMUNITY): Payer: Medicare Other

## 2011-06-04 ENCOUNTER — Encounter (HOSPITAL_COMMUNITY): Payer: Medicare Other

## 2011-06-04 DIAGNOSIS — J9600 Acute respiratory failure, unspecified whether with hypoxia or hypercapnia: Secondary | ICD-10-CM | POA: Diagnosis not present

## 2011-06-04 DIAGNOSIS — N179 Acute kidney failure, unspecified: Secondary | ICD-10-CM

## 2011-06-04 LAB — RENAL FUNCTION PANEL
Albumin: 2.6 g/dL — ABNORMAL LOW (ref 3.5–5.2)
BUN: 77 mg/dL — ABNORMAL HIGH (ref 6–23)
Chloride: 95 mEq/L — ABNORMAL LOW (ref 96–112)
GFR calc Af Amer: 10 mL/min — ABNORMAL LOW (ref >90)
GFR calc non Af Amer: 8 mL/min — ABNORMAL LOW (ref >90)
Potassium: 4 mEq/L (ref 3.5–5.1)
Sodium: 137 mEq/L (ref 135–145)

## 2011-06-04 LAB — CBC
HCT: 29.1 % — ABNORMAL LOW (ref 36.0–46.0)
Hemoglobin: 9.6 g/dL — ABNORMAL LOW (ref 12.0–15.0)
MCHC: 33 g/dL (ref 30.0–36.0)
RDW: 14 % (ref 11.5–15.5)
WBC: 7.2 10*3/uL (ref 4.0–10.5)

## 2011-06-04 LAB — CULTURE, BAL-QUANTITATIVE W GRAM STAIN

## 2011-06-04 LAB — POCT I-STAT 3, ART BLOOD GAS (G3+)
Acid-Base Excess: 6 mmol/L — ABNORMAL HIGH (ref 0.0–2.0)
Bicarbonate: 31.1 mEq/L — ABNORMAL HIGH (ref 20.0–24.0)
O2 Saturation: 100 %
Patient temperature: 37
pO2, Arterial: 253 mmHg — ABNORMAL HIGH (ref 80.0–100.0)

## 2011-06-04 LAB — DIFFERENTIAL
Basophils Absolute: 0.1 10*3/uL (ref 0.0–0.1)
Basophils Relative: 1 % (ref 0–1)
Lymphocytes Relative: 18 % (ref 12–46)
Monocytes Absolute: 0.9 10*3/uL (ref 0.1–1.0)
Monocytes Relative: 13 % — ABNORMAL HIGH (ref 3–12)
Neutro Abs: 4.4 10*3/uL (ref 1.7–7.7)
Neutrophils Relative %: 62 % (ref 43–77)

## 2011-06-04 MED ORDER — FENTANYL CITRATE 0.05 MG/ML IJ SOLN
25.0000 ug | INTRAMUSCULAR | Status: DC | PRN
  Filled 2011-06-04: qty 2

## 2011-06-04 MED ORDER — ETOMIDATE 2 MG/ML IV SOLN
INTRAVENOUS | Status: AC
  Administered 2011-06-04: 20 mg
  Filled 2011-06-04: qty 20

## 2011-06-04 MED ORDER — CHLORHEXIDINE GLUCONATE 0.12 % MT SOLN
15.0000 mL | Freq: Two times a day (BID) | OROMUCOSAL | Status: DC
  Administered 2011-06-04 – 2011-06-06 (×5): 15 mL via OROMUCOSAL
  Filled 2011-06-04 (×10): qty 15

## 2011-06-04 MED ORDER — AMIODARONE HCL 200 MG PO TABS
200.0000 mg | ORAL_TABLET | ORAL | Status: DC
  Administered 2011-06-07 – 2011-06-09 (×2): 200 mg
  Filled 2011-06-04 (×3): qty 1

## 2011-06-04 MED ORDER — DOXAZOSIN MESYLATE 4 MG PO TABS
4.0000 mg | ORAL_TABLET | Freq: Every day | ORAL | Status: DC
  Administered 2011-06-04 – 2011-06-09 (×6): 4 mg
  Filled 2011-06-04 (×6): qty 1

## 2011-06-04 MED ORDER — PRAVASTATIN SODIUM 20 MG PO TABS
20.0000 mg | ORAL_TABLET | Freq: Every day | ORAL | Status: DC
  Administered 2011-06-04 – 2011-06-08 (×5): 20 mg
  Filled 2011-06-04 (×5): qty 1

## 2011-06-04 MED ORDER — ACETYLCYSTEINE 20 % IN SOLN
2.0000 mL | Freq: Two times a day (BID) | RESPIRATORY_TRACT | Status: AC
  Administered 2011-06-04 – 2011-06-06 (×4): 2 mL via RESPIRATORY_TRACT
  Filled 2011-06-04 (×6): qty 4

## 2011-06-04 MED ORDER — FENTANYL CITRATE 0.05 MG/ML IJ SOLN
INTRAMUSCULAR | Status: AC
  Administered 2011-06-04: 200 ug
  Filled 2011-06-04: qty 4

## 2011-06-04 MED ORDER — CEFTAZIDIME 1 G IJ SOLR
1.0000 g | INTRAMUSCULAR | Status: DC
  Administered 2011-06-05 – 2011-06-08 (×4): 1 g via INTRAVENOUS
  Filled 2011-06-04 (×5): qty 1

## 2011-06-04 MED ORDER — LIDOCAINE HCL (CARDIAC) 20 MG/ML IV SOLN
INTRAVENOUS | Status: AC
  Filled 2011-06-04: qty 5

## 2011-06-04 MED ORDER — MIDAZOLAM HCL 2 MG/2ML IJ SOLN
INTRAMUSCULAR | Status: AC
  Administered 2011-06-04: 4 mg
  Filled 2011-06-04: qty 4

## 2011-06-04 MED ORDER — SUCCINYLCHOLINE CHLORIDE 20 MG/ML IJ SOLN
INTRAMUSCULAR | Status: AC
  Filled 2011-06-04: qty 5

## 2011-06-04 MED ORDER — ASPIRIN 81 MG PO CHEW
81.0000 mg | CHEWABLE_TABLET | Freq: Every day | ORAL | Status: DC
  Administered 2011-06-05 – 2011-06-10 (×6): 81 mg
  Filled 2011-06-04 (×6): qty 1

## 2011-06-04 MED ORDER — ROCURONIUM BROMIDE 50 MG/5ML IV SOLN
INTRAVENOUS | Status: AC
  Filled 2011-06-04: qty 2

## 2011-06-04 MED ORDER — DILTIAZEM 12 MG/ML ORAL SUSPENSION
60.0000 mg | Freq: Three times a day (TID) | ORAL | Status: DC
  Filled 2011-06-04: qty 6

## 2011-06-04 MED ORDER — SODIUM CHLORIDE 0.9 % IV SOLN
50.0000 ug/h | INTRAVENOUS | Status: DC
  Administered 2011-06-04: 100 ug/h via INTRAVENOUS
  Administered 2011-06-05 – 2011-06-06 (×3): 150 ug/h via INTRAVENOUS
  Filled 2011-06-04 (×3): qty 50

## 2011-06-04 MED ORDER — DILTIAZEM 12 MG/ML ORAL SUSPENSION
60.0000 mg | Freq: Three times a day (TID) | ORAL | Status: DC
  Administered 2011-06-05 – 2011-06-06 (×6): 60 mg
  Filled 2011-06-04 (×11): qty 6

## 2011-06-04 MED ORDER — FERROUS SULFATE 300 (60 FE) MG/5ML PO SYRP
300.0000 mg | ORAL_SOLUTION | Freq: Two times a day (BID) | ORAL | Status: DC
  Administered 2011-06-04 – 2011-06-10 (×10): 300 mg
  Filled 2011-06-04 (×15): qty 5

## 2011-06-04 MED ORDER — FENTANYL BOLUS VIA INFUSION
50.0000 ug | Freq: Four times a day (QID) | INTRAVENOUS | Status: DC | PRN
  Filled 2011-06-04: qty 100

## 2011-06-04 MED ORDER — PANTOPRAZOLE SODIUM 40 MG PO PACK
40.0000 mg | PACK | Freq: Every day | ORAL | Status: DC
  Administered 2011-06-05 – 2011-06-06 (×2): 40 mg
  Filled 2011-06-04 (×3): qty 20

## 2011-06-04 MED ORDER — MULTI-DELYN PO LIQD
5.0000 mL | Freq: Every day | ORAL | Status: DC
  Administered 2011-06-05 – 2011-06-06 (×2): 5 mL
  Filled 2011-06-04 (×3): qty 5

## 2011-06-04 MED ORDER — MIDAZOLAM HCL 2 MG/2ML IJ SOLN
INTRAMUSCULAR | Status: AC
  Administered 2011-06-04: 14:00:00
  Filled 2011-06-04: qty 2

## 2011-06-04 MED ORDER — BIOTENE DRY MOUTH MT LIQD
15.0000 mL | Freq: Four times a day (QID) | OROMUCOSAL | Status: DC
  Administered 2011-06-05 – 2011-06-07 (×11): 15 mL via OROMUCOSAL

## 2011-06-04 NOTE — Procedures (Signed)
2 mg versed & 75 mcg fentnayl used in divided doses.  Bronchoscope entered from the right nare.  Upper airway nml  Vocal cords showed nml appearance & motion.  Tracha & bronchial tree examined to the subsegmental level.  Thick secretions suctioned out from Left lobe. Beyond purulent secretions the mucosa was friable & there was bleeding. Suction & pressure was used to [employer] hemostasis.  Pt desaturated & required venti mask. RIght sided airways were clear of secretions but mucosa was again friable.  Pt will be transferred to SDU for monitoring post provcedure  ALVA,RAKESH V.

## 2011-06-04 NOTE — Progress Notes (Signed)
PT Cancellation Note  _x__Treatment cancelled today due to medical issues with patient which prohibited therapy (patient due to have bronchoscopy today, RN recommended holding PT.  PT to check back on Monday 06/07/11).    ___ Treatment cancelled today due to patient receiving procedure or test   ___ Treatment cancelled today due to patient's refusal to participate   ___ Treatment cancelled today due to   Signature:Serrina Minogue B. Paulanthony Gleaves, PT, DPT   Pager: [phone]

## 2011-06-04 NOTE — Progress Notes (Signed)
UR Completed.  [name] Jane 06/04/2011  

## 2011-06-04 NOTE — Progress Notes (Signed)
S:Feels a little better.  Has the "jitters" (she says this is chronic) Switched to [place] yesterday; pulm is following her pneumonia for need for possible repeat bronch  O:NAD.  A little tremulous  BP 128/56  Pulse 77  Temp(Src) 98.3 F (36.8 C) (Oral)  Resp 25  Ht 5\' 3"  (1.6 m)  Wt 70 kg (154 lb 5.2 oz)  BMI 27.34 kg/m2  SpO2 90%   Intake/Output Summary (Last 24 hours) at 06/04/11 1191 Last data filed at 06/04/11 0800  Gross per 24 hour  Intake   1200 ml  Output   1625 ml  Net   -425 ml    Weight change:  Scheduled Meds:   . acetylcysteine  4 mL Nebulization BID  . albuterol  2.5 mg Nebulization Q4H  . amiodarone  200 mg Oral Q M,W,F  . aspirin EC  81 mg Oral QAM  . cefTAZidime (FORTAZ) IV  1 g Intravenous Q8H  . cholecalciferol  1,000 Units Oral Daily  . diltiazem  180 mg Oral Daily  . doxazosin  4 mg Oral QHS  . ferrous sulfate  325 mg Oral BID WC  . heparin subcutaneous  5,000 Units Subcutaneous Q8H  . montelukast  10 mg Oral QHS  . multivitamins ther. w/minerals  1 tablet Oral Daily  . pantoprazole  40 mg Oral Q1200  . pravastatin  20 mg Oral q1800  . vancomycin  1,000 mg Intravenous Q12H  . DISCONTD: moxifloxacin  400 mg Intravenous Q24H   Continuous Infusions:   . sodium chloride 10 mL/hr at 06/04/11 0800   PRN Meds:.benzonatate, lidocaine, lidocaine, traMADol EXAM: YNW:GNFAOZHY CVS:S1S2 No S3  HR 70's Resp:BS markedly decreased left chest with rhonchi; right side fairly clear QMV:HQIO Ext:No sig edema  Basic Metabolic Panel:  Basename 06/04/11 0550 06/03/11 0600  NA 137 135  K 4.0 4.0  CL 95* 90*  CO2 29 26  GLUCOSE 128* 120*  BUN 77* 87*  CREATININE 4.30* 5.13*  CALCIUM 8.9 8.5  MG -- --  PHOS 5.6* 6.5*   Liver Function Tests:  Basename 06/04/11 0550 06/03/11 0600 06/02/11 0635  AST -- -- 31  ALT -- -- 20  ALKPHOS -- -- 113  BILITOT -- -- 0.5  PROT -- -- 6.1  ALBUMIN 2.6* 2.8* --   CBC:  Basename 06/04/11 0550  06/03/11 0600  WBC 7.2 7.6  NEUTROABS 4.4 --  HGB 9.6* 9.9*  HCT 29.1* 30.4*  MCV 92.4 91.8  PLT 228 226   Anemia Panel:  Basename 06/03/11 0600  VITAMINB12 --  FOLATE --  FERRITIN --  TIBC 227*  IRON 34*  RETICCTPCT --  * RADIOLOGY REPORT*  Clinical Data: Evaluate ET tube placement  PORTABLE CHEST - 1 VIEW  Comparison: 06/03/2011  Findings:  The patient is status post extubation.  Again noted is near complete opacification of the entire left  hemithorax.  There is a small right effusion and there is atelectasis within the  right base. When compared with the previous exam, the right  pleural effusion appears increased in volume.  IMPRESSION:  1. No significant change in aeration to the left lung compared  with previous exam.  2. Increase in right effusion.  Original Report Authenticated By: Rosealee Albee, M.D.  Assessment/Plan:  1. AKI on CKD.   Urine output is up without the aid of diuretics and creatinine has fallen by nearly 1 mg/dl in the past 24 hours  heralding beginning resolution of AKI.  In the absence of further insults would expect continued recovery toward baseline. Will be important to have pharmacy monitor vancomycin levels closely to avoid accumulation and nephrotoxicity at her low level of GFR. 2. Left sided CAP - s/p bronch; on Vanco and Ceftazidime now; PULM/CCM following; CHR still markedly abnormal 3. HTN - BP lis acceptable  4. H/O DVT/PE - no evidence for PD this admit  5. Anemia - HB 12.9 down to 9.8 - today 9.9 - stable past 2 days (with prior H/O GIB (diverticular) Watch for further drop). T sat low but with current active infection avoid IV iron. 5. Dispo - At this juncture, with resolving AKI, I have no recommendations other than those noted in #1.  Will sign off but happy to followup if recurrent issues.     Armstead Heiland B

## 2011-06-04 NOTE — Procedures (Signed)
Bronchoscopy Procedure Note MARITES NATH [id number] 1921-10-11  Procedure: Bronchoscopy Indications: Remove secretions  Procedure Details Consent: Risks of procedure as well as the alternatives and risks of each were explained to the (patient/caregiver).  Consent for procedure obtained. Time Out: Verified patient identification, verified procedure, site/side was marked, verified correct patient position, special equipment/implants available, medications/allergies/relevent history reviewed, required imaging and test results available.  Performed  In preparation for procedure, patient was given 100% FiO2. Sedation: Etomidate  Airway entered and the following bronchi were examined: RUL, RML, RLL, LUL, LLL and Bronchi.   Procedures performed: Brushings performed Bronchoscope removed.    Evaluation Hemodynamic Status: BP stable throughout; O2 sats: stable throughout Patient's Current Condition: stable Specimens:  Sent purulent fluid Complications: No apparent complications Patient did tolerate procedure well.   Findings: 1. Complete collapse left main all lobes 2. Thick pus, bloody throughout 3. At bifercation LLL / upper division thick white plaques, concerning for malignancy vs infection 4. Bal  Performed, bloody ,toleratedm, white thick chucks and plaque throughout  Tolerated  If cytology neg then will re bronch  BX, brush Avoided today with bleeding   Nelda Bucks. 06/04/2011

## 2011-06-04 NOTE — Progress Notes (Signed)
ANTIBIOTIC CONSULT NOTE - INITIAL  Pharmacy Consult for Vancomycin and Ceftazidime Indication: pneumonia  Allergies  Allergen Reactions  . Lisinopril     REACTION: angioedema  . Primidone Other (See Comments)    No reaction noted  . Clonidine Derivatives Rash    Patient Measurements: Height: 5\' 3"  (160 cm) Weight: 154 lb 5.2 oz (70 kg) IBW/kg (Calculated) : 52.4  Adjusted Body Weight:   Vital Signs: Temp: 98.3 F (36.8 C) (11/09 0804) Temp src: Oral (11/09 0804) BP: 128/56 mmHg (11/09 0757) Pulse Rate: 77  (11/09 0757) Intake/Output from previous day: 11/08 0701 - 11/09 0700 In: 1190 [I.V.:590; IV Piggyback:600] Out: 1625 [Urine:1625] Intake/Output from this shift: Total I/O In: 10 [I.V.:10] Out: -   Labs:  Basename 06/04/11 0550 06/03/11 0600 06/02/11 0635 06/02/11 0435 06/01/11 1547  WBC 7.2 7.6 7.5 -- --  HGB 9.6* 9.9* 9.8* -- --  PLT 228 226 198 -- --  LABCREA -- -- -- 179.17 201.59  CREATININE 4.30* 5.13* 5.15* -- --   Estimated Creatinine Clearance: 8.6 ml/min (by C-G formula based on Cr of 4.3). No results found for this basename: VANCOTROUGH:2,VANCOPEAK:2,VANCORANDOM:2,GENTTROUGH:2,GENTPEAK:2,GENTRANDOM:2,TOBRATROUGH:2,TOBRAPEAK:2,TOBRARND:2,AMIKACINPEAK:2,AMIKACINTROU:2,AMIKACIN:2, in the last 72 hours   Microbiology: Recent Results (from the past 720 hour(s))  CULTURE, BAL-QUANTITATIVE     Status: Normal   Collection Time   06/01/11  4:22 PM      Component Value Range Status Comment   Specimen Description BRONCHIAL ALVEOLAR LAVAGE   Final    Special Requests NONE   Final    Gram Stain     Final    Value: FEW WBC PRESENT, PREDOMINANTLY PMN     RARE GRAM POSITIVE COCCI IN PAIRS     IN CLUSTERS   Colony Count 60,000 COLONIES/ML   Final    Culture Non-Pathogenic Oropharyngeal-type Flora Isolated.   Final    Report Status 06/04/2011 FINAL   Final   AFB CULTURE WITH SMEAR     Status: Normal (Preliminary result)   Collection Time   06/01/11  4:22 PM      Component Value Range Status Comment   Specimen Description BRONCHIAL ALVEOLAR LAVAGE   Final    Special Requests NONE   Final    ACID FAST SMEAR NO ACID FAST BACILLI SEEN   Final    Culture     Final    Value: CULTURE WILL BE EXAMINED FOR 6 WEEKS BEFORE ISSUING A FINAL REPORT   Report Status PENDING   Incomplete   FUNGUS CULTURE W SMEAR     Status: Normal (Preliminary result)   Collection Time   06/01/11  4:22 PM      Component Value Range Status Comment   Specimen Description BRONCHIAL ALVEOLAR LAVAGE   Final    Special Requests NONE   Final    Fungal Smear NO YEAST OR FUNGAL ELEMENTS SEEN   Final    Culture CULTURE IN PROGRESS FOR FOUR WEEKS   Final    Report Status PENDING   Incomplete   MRSA PCR SCREENING     Status: Normal   Collection Time   06/02/11  4:35 AM      Component Value Range Status Comment   MRSA by PCR NEGATIVE  NEGATIVE  Final     Medical History: Past Medical History  Diagnosis Date  . Acute on chronic diastolic congestive heart failure 05/28/2011  . Hypertensive heart disease without CHF 05/28/2011  . History of pulmonary embolus (PE) 05/28/2011    2006 following pelvic fracture  .  Chronic venous insufficiency 05/28/2011  . Hyperlipidemia 05/28/2011  . Gout 05/28/2011  . Asthma 05/28/2011  . History of GI diverticular bleed 05/28/2011    June 2012  . Atrial fibrillation 05/28/2011  . Chronic kidney disease stage 3 05/28/2011  . Kidney stones   . Osteoporosis   . Pulmonary hypertension 05/28/2011  . GERD (gastroesophageal reflux disease) 05/28/2011  . Mitral regurgitation 05/31/2011  . Secondary hyperparathyroidism (of renal origin)   . Anemia of chronic disease   . History of CVA (cerebrovascular accident)     Medications:  Prescriptions prior to admission  Medication Sig Dispense Refill  . amiodarone (PACERONE) 200 MG tablet Take 200 mg by mouth 3 (three) times a week. Take one tablet on Monday, Wednesday, and Friday       . aspirin EC 81 MG tablet Take  81 mg by mouth every morning.        . budesonide-formoterol (SYMBICORT) 80-4.5 MCG/ACT inhaler Inhale 2 puffs into the lungs 2 (two) times daily as needed. For shortness of breath       . bumetanide (BUMEX) 2 MG tablet Take 2 mg by mouth daily.        . cholecalciferol (VITAMIN D) 1000 UNITS tablet Take 1,000 Units by mouth daily.        Marland Kitchen diltiazem (CARDIZEM CD) 180 MG 24 hr capsule Take 180 mg by mouth daily.        Marland Kitchen doxazosin (CARDURA) 4 MG tablet Take 4 mg by mouth at bedtime.        . ferrous sulfate 325 (65 FE) MG tablet Take 325 mg by mouth 2 (two) times daily with a meal.        . Multiple Vitamins-Minerals (MULTIVITAMINS THER. W/MINERALS) TABS Take 1 tablet by mouth daily.        Marland Kitchen omeprazole (PRILOSEC) 20 MG capsule Take 20 mg by mouth daily.        . pravastatin (PRAVACHOL) 20 MG tablet Take 20 mg by mouth every evening.        . montelukast (SINGULAIR) 10 MG tablet Take 10 mg by mouth daily as needed. For shortness of breath       . traMADol (ULTRAM) 50 MG tablet Take 50 mg by mouth every 6 (six) hours as needed. For muscle spasms. Maximum dose= 8 tablets per day        Assessment: Patient in 2900 step down unit for acute renal dysfunction and pneumonia treated with doxy and moxi but has worsened.    Day #1 Vancomycin and Ceftazidime doses changed for renal dysfunction.  UOP 1.6L yesterday despite increased Cr elevated 4.3 but improved form 5.3.  Will over estimate GFR due to improving renal function.  Goal of Therapy:  Vancomycin trough level 15-20 mcg/ml  Plan:  Change Ceftazidime to 1GM IV q24h adjust as needed for improving renal function.  Hold Vancomycin for now and check a random level in am  Marcelino Scot 06/04/2011,10:29 AM

## 2011-06-04 NOTE — Progress Notes (Signed)
Subjective:  Patient has had progressive respiratory failure overnight. She has now been intubated and has had significant lower lobe and left lung pneumonia. There is a question of whether she has a malignancy or not. She is currently sedated and intubated and the history is not obtainable.  Objective:  Vital Signs in the last 24 hours: Temp:  [97.9 F (36.6 C)-98.3 F (36.8 C)] 98.3 F (36.8 C) (11/09 1205) Pulse Rate:  [36-81] 51  (11/09 1601) Cardiac Rhythm:  [-] Normal sinus rhythm (11/09 1400) Resp:  [14-26] 17  (11/09 1601) BP: (115-151)/(34-72) 129/34 mmHg (11/09 1400) SpO2:  [89 %-100 %] 100 % (11/09 1601) FiO2 (%):  [50 %-100 %] 50 % (11/09 1619) 1.  Physical Exam: BP Readings from Last 1 Encounters:  06/04/11 129/34    Wt Readings from Last 1 Encounters:  06/03/11 70 kg (154 lb 5.2 oz)    Weight change:    Patient is currently intubated sedated and ventilated. She is calm and in no respiratory distress. Lungs reduced breath sounds in the left base. No definite wheezing is noted. Cardiac: Normal S1-S2, occasional early beat. 2/6 systolic murmur radiating from the aortic area up toward the neck. Abdomen: Soft nontender. No edema present.  Intake/Output from previous day: 11/08 0701 - 11/09 0700 In: 1190 [I.V.:590; IV Piggyback:600] Out: 1625 [Urine:1625]  Lab Results: BMET    Component Value Date/Time   NA 137 06/04/2011 0550   K 4.0 06/04/2011 0550   CL 95* 06/04/2011 0550   CO2 29 06/04/2011 0550   GLUCOSE 128* 06/04/2011 0550   BUN 77* 06/04/2011 0550   CREATININE 4.30* 06/04/2011 0550   CALCIUM 8.9 06/04/2011 0550   GFRNONAA 8* 06/04/2011 0550   GFRAA 10* 06/04/2011 0550   CBC    Component Value Date/Time   WBC 7.2 06/04/2011 0550   RBC 3.15* 06/04/2011 0550   HGB 9.6* 06/04/2011 0550   HCT 29.1* 06/04/2011 0550   PLT 228 06/04/2011 0550   MCV 92.4 06/04/2011 0550   MCH 30.5 06/04/2011 0550   MCHC 33.0 06/04/2011 0550   RDW 14.0 06/04/2011 0550   LYMPHSABS 1.3  06/04/2011 0550   MONOABS 0.9 06/04/2011 0550   EOSABS 0.5 06/04/2011 0550   BASOSABS 0.1 06/04/2011 0550   TELEMETRY  Normal sinus rhythm with occasional PACs. No atrial fibrillation noted. Assessment/Plan:  1. Pneumonia with respiratory failure  Assessment: Currently intubated with predominance of care done by critical care medicine. I will defer to them in terms of management of pneumonia. The role question is whether this turns out to be malignant or not. At her age if it is, then this will temper our treatment somewhat. 2. Acute on chronic renal failure likely due to contrast  Assessment: Appears to have peaked and appears to be in the resolving phase. She does not appear to be clinically volume overloaded at this time. Hopefully this will improve. 3. Atrial fibrillation Assessment: in sinus with PAC's Plan:continue amio per tube. She has not tolerated atrial fibrillation well previously.  Appreciate Dr. Gwendolyn Grant help. Her prognosis appear somewhat guarded at this point with an ongoing pneumonia and active bleeding into the lung. Pulsates to the daughter and give significant family support.   LOS: 7 days    W. Ashley Royalty.  MD [facility] 06/04/2011, 4:32 PM

## 2011-06-04 NOTE — Progress Notes (Signed)
    [name] is an 77 y.o. female. With bronchitis x 5 ds & lt atelectasis on CT, s/p collapse and bronched to open. Vitals No data found. ]  Intake/Output Summary (Last 24 hours) at 06/04/11 1124 Last data filed at 06/04/11 1000  Gross per 24 hour  Intake   1220 ml  Output   1625 ml  Net   -405 ml   C/o cough, denies CP  EXAM Gen: Pleasant, well-nourished, increased distress, coughing, increased O 2 needs  ENT: No lesions, no jvd  Neck: No JVD, no TMG, no carotid bruits  Lungs: distress noted, hypoxcia, ronchi, reduced left  Cardiovascular: RRR, heart sounds normal, no murmur or gallops, no peripheral edema  Abdomen: soft and NT, no HSM,  BS normal  Neuro: alert, non focal  Skin: Warm, no lesions or rashes  LAB RESULTS CBC    Component Value Date/Time   WBC 7.2 06/04/2011 0550   RBC 3.15* 06/04/2011 0550   HGB 9.6* 06/04/2011 0550   HCT 29.1* 06/04/2011 0550   PLT 228 06/04/2011 0550   MCV 92.4 06/04/2011 0550   MCH 30.5 06/04/2011 0550   MCHC 33.0 06/04/2011 0550   RDW 14.0 06/04/2011 0550   LYMPHSABS 1.3 06/04/2011 0550   MONOABS 0.9 06/04/2011 0550   EOSABS 0.5 06/04/2011 0550   BASOSABS 0.1 06/04/2011 0550    BMET    Component Value Date/Time   NA 137 06/04/2011 0550   K 4.0 06/04/2011 0550   CL 95* 06/04/2011 0550   CO2 29 06/04/2011 0550   GLUCOSE 128* 06/04/2011 0550   BUN 77* 06/04/2011 0550   CREATININE 4.30* 06/04/2011 0550   CALCIUM 8.9 06/04/2011 0550   GFRNONAA 8* 06/04/2011 0550   GFRAA 10* 06/04/2011 0550       PCXR 11/ 5-compared to day prior lUL open ish, remained collapsed Impression/Plan Patient Active Hospital Problem List: Pneumonia (05/31/2011)   Assessment:  CAP with collapse left reoccurent , now worsening distress hypoxia   Plan: no fevers, but no clinical progress Maintain newly started vanc, ceftaz She continue to collapse the lung and now declines Will consider intubation athen bronch after d/w daughter further Hope for  short term intubation Re add chst pt, to percussion, failed, not tolerated Contiunue mucomysts with neb x 24 hrs more Has done poorly with NIMV iif tubed then TV 8 cc/kg, rate 12 100% peep 8-10 to aerate then reduce in am  Will plan bronch after intubation pcxr to follow Will send new BAL  Acute on chronic renal insufficiency (05/31/2011)   Assessment: resolving, per renal improved Chem in am    Nelda Bucks.  Likely needs intubation and bronch Move to ICU

## 2011-06-04 NOTE — Progress Notes (Signed)
TRANSFERRED TO [id number] BY BED. FOR BRONCHOSCOPY.

## 2011-06-05 ENCOUNTER — Inpatient Hospital Stay (HOSPITAL_COMMUNITY): Payer: Medicare Other

## 2011-06-05 LAB — GLUCOSE, CAPILLARY
Glucose-Capillary: 99 mg/dL (ref 70–99)
Glucose-Capillary: 108 mg/dL — ABNORMAL HIGH (ref 70–99)
Glucose-Capillary: 125 mg/dL — ABNORMAL HIGH (ref 70–99)

## 2011-06-05 LAB — RENAL FUNCTION PANEL
BUN: 67 mg/dL — ABNORMAL HIGH (ref 6–23)
CO2: 29 mEq/L (ref 19–32)
Chloride: 98 mEq/L (ref 96–112)
Creatinine, Ser: 3.54 mg/dL — ABNORMAL HIGH (ref 0.50–1.10)
Glucose, Bld: 92 mg/dL (ref 70–99)

## 2011-06-05 LAB — VANCOMYCIN, RANDOM: Vancomycin Rm: 26.3 ug/mL

## 2011-06-05 MED ORDER — ALBUTEROL SULFATE (5 MG/ML) 0.5% IN NEBU
2.5000 mg | INHALATION_SOLUTION | RESPIRATORY_TRACT | Status: DC
  Administered 2011-06-05 – 2011-06-08 (×19): 2.5 mg via RESPIRATORY_TRACT
  Filled 2011-06-05 (×19): qty 0.5

## 2011-06-05 NOTE — Progress Notes (Addendum)
ANTIBIOTIC CONSULT NOTE - INITIAL  Pharmacy Consult for Vancomycin and Ceftazidime Indication: pneumonia  Allergies  Allergen Reactions  . Lisinopril     REACTION: angioedema  . Primidone Other (See Comments)    No reaction noted  . Clonidine Derivatives Rash    Patient Measurements: Height: 5\' 3"  (160 cm) Weight: 151 lb 10.8 oz (68.8 kg) IBW/kg (Calculated) : 52.4    Vital Signs: Temp: 98.3 F (36.8 C) (11/10 0300) Temp src: Oral (11/10 0300) BP: 117/43 mmHg (11/10 0700) Pulse Rate: 72  (11/10 0834) Intake/Output from previous day: 11/09 0701 - 11/10 0700 In: 636.3 [I.V.:584.3; NG/GT:52] Out: 1000 [Urine:1000] Intake/Output from this shift: Total I/O In: 90 [I.V.:40; NG/GT:50] Out: 100 [Urine:100]  Labs:  Basename 06/05/11 0545 06/04/11 0550 06/03/11 0600  WBC -- 7.2 7.6  HGB -- 9.6* 9.9*  PLT -- 228 226  LABCREA -- -- --  CREATININE 3.54* 4.30* 5.13*   Estimated Creatinine Clearance: 10.4 ml/min (by C-G formula based on Cr of 3.54).  Basename 06/05/11 0545  VANCOTROUGH --  Leodis Binet --  VANCORANDOM 26.3  GENTTROUGH --  GENTPEAK --  GENTRANDOM --  TOBRATROUGH --  TOBRAPEAK --  TOBRARND --  AMIKACINPEAK --  AMIKACINTROU --  AMIKACIN --     Microbiology: Recent Results (from the past 720 hour(s))  CULTURE, BAL-QUANTITATIVE     Status: Normal   Collection Time   06/01/11  4:22 PM      Component Value Range Status Comment   Specimen Description BRONCHIAL ALVEOLAR LAVAGE   Final    Special Requests NONE   Final    Gram Stain     Final    Value: FEW WBC PRESENT, PREDOMINANTLY PMN     RARE GRAM POSITIVE COCCI IN PAIRS     IN CLUSTERS   Colony Count 60,000 COLONIES/ML   Final    Culture Non-Pathogenic Oropharyngeal-type Flora Isolated.   Final    Report Status 06/04/2011 FINAL   Final   AFB CULTURE WITH SMEAR     Status: Normal (Preliminary result)   Collection Time   06/01/11  4:22 PM      Component Value Range Status Comment   Specimen  Description BRONCHIAL ALVEOLAR LAVAGE   Final    Special Requests NONE   Final    ACID FAST SMEAR NO ACID FAST BACILLI SEEN   Final    Culture     Final    Value: CULTURE WILL BE EXAMINED FOR 6 WEEKS BEFORE ISSUING A FINAL REPORT   Report Status PENDING   Incomplete   FUNGUS CULTURE W SMEAR     Status: Normal (Preliminary result)   Collection Time   06/01/11  4:22 PM      Component Value Range Status Comment   Specimen Description BRONCHIAL ALVEOLAR LAVAGE   Final    Special Requests NONE   Final    Fungal Smear NO YEAST OR FUNGAL ELEMENTS SEEN   Final    Culture CULTURE IN PROGRESS FOR FOUR WEEKS   Final    Report Status PENDING   Incomplete   MRSA PCR SCREENING     Status: Normal   Collection Time   06/02/11  4:35 AM      Component Value Range Status Comment   MRSA by PCR NEGATIVE  NEGATIVE  Final   CULTURE, BAL-QUANTITATIVE     Status: Normal (Preliminary result)   Collection Time   06/04/11  4:31 PM      Component Value Range Status  Comment   Specimen Description BRONCHIAL ALVEOLAR LAVAGE   Final    Special Requests NONE   Final    Gram Stain     Final    Value: MODERATE WBC PRESENT, PREDOMINANTLY PMN     NO ORGANISMS SEEN   Colony Count PENDING   Incomplete    Culture PENDING   Incomplete    Report Status PENDING   Incomplete     Assessment: Patient in 2900 step down unit for acute renal dysfunction and pneumonia treated with doxy and moxi but has worsened.    Day #2 Vancomycin and Ceftazidime doses changed for renal dysfunction.  UOP 1L yesterday acute on chronic renal dyzfunction improving Cr 3.5 today.  Random vancomycin level 26 VSS, - asa,dilt,amio,statin  WBC wnl, BAL no growth to date,    Mouthcare, heparin sq for dvt px, ppi per tube gipx  Goal of Therapy:  Vancomycin trough level 15-20 mcg/ml  Plan: Ceftazidime 1gm q 24hr  Vancomycin 1 GM IV q24hr Marcelino Scot 06/05/2011,10:14 AM

## 2011-06-05 NOTE — Progress Notes (Signed)
Patient Description  [name] is an 77 y.o. female. With bronchitis x 5 ds & lt atelectasis on CT, s/p collapse and bronched to open.    Location Start Stop  oett   11/9>>>    Culture Date Result  Bal Fungal culture MRSA screen  procalcitonin 11/9>>> 11/9>>> 11/9:  11/11>>> Mod wbc, no orgs>>>  neg   Antibiotic Indication Start Stop  vanc ceftazidime  (PNA) (PNA) 11/9>>> 11/9>>>    GI Prophylaxis DVT Prophylaxis  ppi [place] heparin   Protocols  Sedation 11/9    Consultants       Date Events       Vitals Temp:  [98.3 F (36.8 C)-99.1 F (37.3 C)] 98.3 F (36.8 C) (11/10 0300) Pulse Rate:  [41-81] 72  (11/10 0834) Resp:  [15-26] 20  (11/10 0834) BP: (102-151)/(34-94) 117/43 mmHg (11/10 0700) SpO2:  [90 %-100 %] 99 % (11/10 0834) FiO2 (%):  [40 %-100 %] 40 % (11/10 1000) Weight:  [68.8 kg (151 lb 10.8 oz)] 151 lb 10.8 oz (68.8 kg) (11/10 0431)   Intake/Output Summary (Last 24 hours) at 06/05/11 1159 Last data filed at 06/05/11 1000  Gross per 24 hour  Intake 703.83 ml  Output   1100 ml  Net -396.17 ml   Subjective Awake and on full ventilator support. Failed attempt at spontaneous breathing trials almost immediately this morning  EXAM Gen: Pleasant, well-nourished,currently on full ventilator support ENT: No lesions, no jvd, orally intubated. Neck: No JVD, no TMG, no carotid bruits Lungs: diffuse and scattered rhonchi, decreased in both bases. Cardiovascular: RRR, heart sounds normal, 4/6 systolic ejection murmur Abdomen: soft and NT, no HSM,  BS normal Neuro: alert, non focal Ext:no edema, this Refill, warm skin. Skin: Warm, no lesions or rashes  Vent Mode:  [-] PRVC FiO2 (%):  [40 %-100 %] 40 % Set Rate:  [15 bmp] 15 bmp Vt Set:  [450 mL] 450 mL PEEP:  [5 cmH20] 5 cmH20 Plateau Pressure:  [10 cmH20-35 cmH20] 34 cmH20  LAB RESULTS Lab Results  Component Value Date   CREATININE 3.54* 06/05/2011   BUN 67* 06/05/2011   NA 139  06/05/2011   K 4.6 06/05/2011   CL 98 06/05/2011   CO2 29 06/05/2011   Lab Results  Component Value Date   WBC 7.2 06/04/2011   HGB 9.6* 06/04/2011   HCT 29.1* 06/04/2011   MCV 92.4 06/04/2011   PLT 228 06/04/2011   ABG    Component Value Date/Time   PHART 7.418* 06/04/2011 1616   pco2 48    HCO3 31.1* 06/04/2011 1616   TCO2 33 06/04/2011 1616   O2SAT 100.0 06/04/2011 1616    PCXR Increased right sided airspace disease, persistent bilateral airspace disease, or overall aeration is worse compared to prior film.  Impression/Plan 1) acute respiratory failure, and in the setting of pneumonia (no organism specified), further complicated by LEFT-sided collapse11/8/12, and mucous plugging.  Plan: -continue chest physiotherapy -Continue full ventilator support, wean PEEP and FiO2 -Continue aggressive bronchodilator therapy -Intermittent sedation -Continue antibiotics, followup pending cultures -Daily assessment for weaning -followup chest x-ray and arterial blood gas in the morning -we'll discontinue Mucomyst after today - AWAIT CYTOLOGY   - Acute on chronic renal insufficiency (05/31/2011). This is slowly improving. Recent Labs  Basename 06/05/11 0545 06/04/11 0550 06/03/11 0600   CREATININE 3.54* 4.30* 5.13*  plan -keep euvolemic, followup chemistry in the morning   BABCOCK,PETE  STAFF NOTE: I Dr Marchelle Gearing personally evaluated patient. Elicited  key findings of hte fact she failed SBT and is ventilator dependent due to collapse. wE are waiting on cytology. Meanwhile, sbt and ventilatory support as tolerated. Renal fialure is slowly improving. Age impeding recovery. Rest per NP notes above that I agree with.   31 minutes critical care time

## 2011-06-06 ENCOUNTER — Inpatient Hospital Stay (HOSPITAL_COMMUNITY): Payer: Medicare Other

## 2011-06-06 DIAGNOSIS — J9600 Acute respiratory failure, unspecified whether with hypoxia or hypercapnia: Secondary | ICD-10-CM

## 2011-06-06 DIAGNOSIS — J9819 Other pulmonary collapse: Secondary | ICD-10-CM

## 2011-06-06 DIAGNOSIS — N179 Acute kidney failure, unspecified: Secondary | ICD-10-CM

## 2011-06-06 LAB — GLUCOSE, CAPILLARY
Glucose-Capillary: 130 mg/dL — ABNORMAL HIGH (ref 70–99)
Glucose-Capillary: 138 mg/dL — ABNORMAL HIGH (ref 70–99)
Glucose-Capillary: 152 mg/dL — ABNORMAL HIGH (ref 70–99)

## 2011-06-06 LAB — BLOOD GAS, ARTERIAL
Drawn by: 23604
O2 Saturation: 99.1 %
PEEP: 5 cmH2O
Patient temperature: 98.6
RATE: 15 resp/min

## 2011-06-06 LAB — BASIC METABOLIC PANEL
Chloride: 100 mEq/L (ref 96–112)
GFR calc Af Amer: 15 mL/min — ABNORMAL LOW (ref >90)
Potassium: 4 mEq/L (ref 3.5–5.1)

## 2011-06-06 LAB — CBC
HCT: 29.2 % — ABNORMAL LOW (ref 36.0–46.0)
Hemoglobin: 9.5 g/dL — ABNORMAL LOW (ref 12.0–15.0)
WBC: 11 10*3/uL — ABNORMAL HIGH (ref 4.0–10.5)

## 2011-06-06 MED ORDER — ONDANSETRON HCL 4 MG/2ML IJ SOLN
4.0000 mg | Freq: Three times a day (TID) | INTRAMUSCULAR | Status: DC | PRN
  Administered 2011-06-06: 4 mg via INTRAVENOUS

## 2011-06-06 MED ORDER — ONDANSETRON HCL 4 MG/2ML IJ SOLN
INTRAMUSCULAR | Status: AC
  Administered 2011-06-06: 4 mg via INTRAVENOUS
  Filled 2011-06-06: qty 2

## 2011-06-06 MED ORDER — FUROSEMIDE 10 MG/ML IJ SOLN
40.0000 mg | Freq: Two times a day (BID) | INTRAMUSCULAR | Status: AC
  Administered 2011-06-06 (×2): 40 mg via INTRAVENOUS

## 2011-06-06 MED ORDER — POTASSIUM CHLORIDE 20 MEQ/15ML (10%) PO LIQD
20.0000 meq | Freq: Two times a day (BID) | ORAL | Status: AC
  Administered 2011-06-06 (×2): 20 meq
  Filled 2011-06-06 (×2): qty 15

## 2011-06-06 NOTE — Progress Notes (Signed)
Name: [name] MRN: [id number] DOB: 09-28-1921  ELECTRONIC ICU PHYSICIAN NOTE  Problem:  Nausea   Intervention:  Zofran 4 mg IV q 8 h prn  Sandrea Hughs 06/06/2011, 11:24 PM ff

## 2011-06-06 NOTE — Progress Notes (Signed)
During camera rounding, noted pt using left hand attempting to undo ETT tapes on face. Bedside RN notified and now at patient's side.

## 2011-06-06 NOTE — Progress Notes (Signed)
Patient Description  Maiana A Taitano is an 77 y.o. female. With bronchitis x 5 ds & lt atelectasis on CT, s/p collapse and bronched to open.    Location Start Stop  oett   11/9>>>    Culture Date Result  Bal Fungal culture MRSA screen  procalcitonin 11/9>>> 11/9>>> 11/9:  11/11: Mod wbc, no orgs>>>  Neg  0.58   Antibiotic Indication Start Stop  vanc ceftazidime  (PNA) (PNA) 11/9>>> 11/9>>>    GI Prophylaxis DVT Prophylaxis  ppi [place] heparin   Protocols  Sedation 11/9    Consultants       Date Events       Vitals Temp:  [98.1 F (36.7 C)-99.6 F (37.6 C)] 98.5 F (36.9 C) (11/11 0800) Pulse Rate:  [58-73] 66  (11/11 0811) Resp:  [15-21] 18  (11/11 0400) BP: (102-141)/(31-58) 141/44 mmHg (11/11 0811) SpO2:  [63 %-100 %] 98 % (11/11 0811) FiO2 (%):  [40 %] 40 % (11/11 0811)   Intake/Output Summary (Last 24 hours) at 06/06/11 0844 Last data filed at 06/06/11 0800  Gross per 24 hour  Intake  447.5 ml  Output    685 ml  Net -237.5 ml   Subjective Awake and on full ventilator support. Unable to wean  EXAM Gen: Pleasant, well-nourished,currently on full ventilator support ENT: No lesions, no jvd, orally intubated. Her right thigh conjunctiva remains your ischemic and edematous Neck: No JVD, no TMG, no carotid bruits Lungs: diffuse and scattered rhonchi, decreased in both bases. Cardiovascular: RRR, heart sounds normal, 4/6 systolic ejection murmur Abdomen: soft and NT, no HSM,  BS normal Neuro: alert, non focal, appropriate but anxious Ext:no edema, this Refill, warm skin. Skin: Warm, no lesions or rashes  Vent Mode:  [-] PRVC FiO2 (%):  [40 %] 40 % Set Rate:  [15 bmp] 15 bmp Vt Set:  [450 mL] 450 mL PEEP:  [5 cmH20-15 cmH20] 5 cmH20 Pressure Support:  [5 cmH20] 5 cmH20 Plateau Pressure:  [18 cmH20-34 cmH20] 21 cmH20  LAB RESULTS Lab Results  Component Value Date   CREATININE 2.95* 06/06/2011   BUN 60* 06/06/2011   NA 144 06/06/2011   K 4.0 06/06/2011   CL 100 06/06/2011   CO2 25 06/06/2011   Lab Results  Component Value Date   WBC 11.0* 06/06/2011   HGB 9.5* 06/06/2011   HCT 29.2* 06/06/2011   MCV 93.6 06/06/2011   PLT 217 06/06/2011   ABG ABG    Component Value Date/Time   PHART 7.414* 06/06/2011 0306   PCO2ART 39.6 06/06/2011 0306   PO2ART 134.0* 06/06/2011 0306   HCO3 24.8* 06/06/2011 0306   TCO2 26.1 06/06/2011 0306   O2SAT 99.1 06/06/2011 0306   s PCXR Right greater than left-sided airspace disease slight improvement in comparison with film on 06-05-11.  Impression/Plan 1) acute respiratory failure, and in the setting of pneumonia (no organism specified), further complicated by LEFT-sided collapse11/8/12, and mucous plugging. Aeration on 06/06/11 improved on chest x-ray  Plan: -continue chest physiotherapy -weaning as tolerated -Continue aggressive bronchodilator therapy -transitioned to Precedex -Continue antibiotics, followup pending cultures -Daily assessment for weaning -followup chest x-ray and arterial blood gas in the morning -discontinue Mucomyst - AWAIT CYTOLOGY -diuresis x1 today   - Acute on chronic renal insufficiency (05/31/2011). This is slowly improving. Recent Labs  Basename 06/06/11 0615 06/05/11 0545 06/04/11 0550   CREATININE 2.95* 3.54* 4.30*  plan -keep euvolemic, followup chemistry in the morning   BABCOCK,PETE  STAFF NOTE:I, Dr Judie Petit  Chevon Fomby have personally reviewed patient's available data, including medical history, events of note, physical examination and test results as part of my evaluation. I have discussed with NP and other care providers.  In addition I personally evaluated patient and elicited key findings of failure to wean still on 06/06/11 due to collapse and acoute resp failure. WE  Will try precedex today for agitation. Rest per NP whose note is outlined above and that I agree with.  30 minutes critical care

## 2011-06-07 ENCOUNTER — Inpatient Hospital Stay (HOSPITAL_COMMUNITY): Payer: Medicare Other

## 2011-06-07 LAB — CBC
HCT: 30.4 % — ABNORMAL LOW (ref 36.0–46.0)
MCH: 30.3 pg (ref 26.0–34.0)
MCV: 95 fL (ref 78.0–100.0)
Platelets: 244 10*3/uL (ref 150–400)
RDW: 14.5 % (ref 11.5–15.5)

## 2011-06-07 LAB — BLOOD GAS, ARTERIAL
Acid-Base Excess: 7.4 mmol/L — ABNORMAL HIGH (ref 0.0–2.0)
Bicarbonate: 32.4 mEq/L — ABNORMAL HIGH (ref 20.0–24.0)
Patient temperature: 98.6
TCO2: 34.1 mmol/L (ref 0–100)
pCO2 arterial: 55.6 mmHg — ABNORMAL HIGH (ref 35.0–45.0)

## 2011-06-07 LAB — BASIC METABOLIC PANEL
CO2: 34 mEq/L — ABNORMAL HIGH (ref 19–32)
Calcium: 9.7 mg/dL (ref 8.4–10.5)
Chloride: 101 mEq/L (ref 96–112)
Glucose, Bld: 104 mg/dL — ABNORMAL HIGH (ref 70–99)
Sodium: 147 mEq/L — ABNORMAL HIGH (ref 135–145)

## 2011-06-07 LAB — CULTURE, BAL-QUANTITATIVE W GRAM STAIN

## 2011-06-07 MED ORDER — THERA M PLUS PO TABS
1.0000 | ORAL_TABLET | Freq: Every day | ORAL | Status: DC
  Administered 2011-06-07 – 2011-06-09 (×3): 1 via ORAL
  Filled 2011-06-07 (×4): qty 1

## 2011-06-07 MED ORDER — PANTOPRAZOLE SODIUM 40 MG PO TBEC
40.0000 mg | DELAYED_RELEASE_TABLET | Freq: Every day | ORAL | Status: DC
  Administered 2011-06-08 – 2011-06-09 (×2): 40 mg via ORAL
  Filled 2011-06-07: qty 1

## 2011-06-07 NOTE — Progress Notes (Signed)
Subjective:  Events of weekend reviewed. She was intubated over the weekend with progressive respiratory failure but self extubated overnight. She is currently awake and alert and knows me and complaints of hoarseness but does not complaining of any shortness of breath. She is oriented to place. She denies any chest discomfort. Objective:  Vital Signs in the last 24 hours: Temp:  [97.4 F (36.3 C)-100.1 F (37.8 C)] 97.4 F (36.3 C) (11/12 0800) Pulse Rate:  [55-82] 75  (11/12 0700) Cardiac Rhythm:  [-] Normal sinus rhythm;Heart block (11/12 0000) Resp:  [14-23] 19  (11/12 0700) BP: (119-151)/(39-82) 133/49 mmHg (11/12 0700) SpO2:  [93 %-100 %] 100 % (11/12 0736) FiO2 (%):  [40 %] 40 % (11/12 0800) Weight:  [68.2 kg (150 lb 5.7 oz)] 150 lb 5.7 oz (68.2 kg) (11/12 0500) 1.  Physical Exam: BP Readings from Last 1 Encounters:  06/07/11 133/49    Wt Readings from Last 1 Encounters:  06/07/11 68.2 kg (150 lb 5.7 oz)    Weight change: 0.3 kg (10.6 oz)   Elderly white female who is in no acute distress, her respirations are unlabored and she has a Ventimask on.. She is calm and in no respiratory distress. Lungs bilateral wheezing is noted. Cardiac: Normal S1-S2, occasional early beat. 2/6 systolic murmur radiating from the aortic area up toward the neck. Abdomen: Soft nontender. No edema present.  Intake/Output from previous day: 11/11 0701 - 11/12 0700 In: 320 [I.V.:125; NG/GT:145; IV Piggyback:50] Out: 2890 [Urine:2890]  Lab Results: BMET    Component Value Date/Time   NA 144 06/06/2011 0615   K 4.0 06/06/2011 0615   CL 100 06/06/2011 0615   CO2 25 06/06/2011 0615   GLUCOSE 144* 06/06/2011 0615   BUN 60* 06/06/2011 0615   CREATININE 2.95* 06/06/2011 0615   CALCIUM 9.4 06/06/2011 0615   GFRNONAA 13* 06/06/2011 0615   GFRAA 15* 06/06/2011 0615   CBC    Component Value Date/Time   WBC 11.0* 06/06/2011 0615   RBC 3.12* 06/06/2011 0615   HGB 9.5* 06/06/2011 0615   HCT 29.2*  06/06/2011 0615   PLT 217 06/06/2011 0615   MCV 93.6 06/06/2011 0615   MCH 30.4 06/06/2011 0615   MCHC 32.5 06/06/2011 0615   RDW 14.3 06/06/2011 0615   LYMPHSABS 1.3 06/04/2011 0550   MONOABS 0.9 06/04/2011 0550   EOSABS 0.5 06/04/2011 0550   BASOSABS 0.1 06/04/2011 0550   TELEMETRY  Normal sinus rhythm with occasional PACs. No atrial fibrillation noted. Assessment/Plan:  1. Pneumonia with respiratory failure  Assessment: She self extubated last time but is maintaining oxygenation on the Ventimask. The family has stated that she does not wish to be reintubated but should continue supportive measures for pneumonia at her age.  2. Acute on chronic renal failure likely due to contrast  Assessment: Appears to have peaked and appears to be in the resolving phase. She is diuresing well at this time and the creatinine continues to fall. 3. Atrial fibrillation Assessment: in sinus with PAC's Plan:continue amio. 3. Acute on chronic diastolic heart failure. Assessment: She appears to be resolving and is diuresing quite well at the present time.   Appreciate Dr. Gwendolyn Grant help. Her prognosis appear somewhat guarded at this point with an ongoing pneumonia, her age and other comorbidities. Spoke with daughter via phone. Will need to clarify DNR to be sure that is what she really wants.Await results of cytology.    LOS: 10 days    W. Viann Fish, [name].  MD [facility] 06/07/2011, 8:58 AM

## 2011-06-07 NOTE — Progress Notes (Signed)
Name: VANNESSA GODOWN MRN: [id number] DOB: [birth year]  ELECTRONIC ICU PHYSICIAN NOTE Problem:  Pt is ncb, self extubated but tolerating well/ also lost Feeding tube  Intervention:  D/c cardizem for now, reassess/ simplify rx later this am  Sandrea Hughs 06/07/2011, 5:34 AM

## 2011-06-07 NOTE — Progress Notes (Signed)
Pt's daughter and son-in-law here to see pt.Currently at bedside.

## 2011-06-07 NOTE — Progress Notes (Cosign Needed)
125cc Fentanyl wasted in sink. Witnessed by two RN's. Donzetta Sprung, RN.

## 2011-06-07 NOTE — Progress Notes (Signed)
  Patient Description  [name] is an 77 y.o. female. With bronchitis x 5 ds & lt atelectasis on CT, s/p collapse and bronched to open.    Location Start Stop  oett   11/9>>> 11/12 (self)   Culture Date Result  Bal Fungal culture MRSA screen  procalcitonin 11/9>>> 11/9>>> 11/9:  11/11: Mod wbc, no orgs>>> NOF  Neg  0.58   Antibiotic Indication Start Stop  vanc ceftazidime  (PNA) (PNA) 11/9>>> 11/9>>> 11/12   GI Prophylaxis DVT Prophylaxis  ppi [place] heparin   Protocols  Sedation 11/9 >> 11/12    Consultants       Date Events       Vitals Temp:  [97.4 F (36.3 C)-100.1 F (37.8 C)] 97.4 F (36.3 C) (11/12 0800) Pulse Rate:  [55-82] 75  (11/12 0700) Resp:  [14-23] 19  (11/12 0700) BP: (119-151)/(39-82) 133/49 mmHg (11/12 0700) SpO2:  [93 %-100 %] 100 % (11/12 0736) FiO2 (%):  [40 %] 40 % (11/12 0800) Weight:  [68.2 kg (150 lb 5.7 oz)] 150 lb 5.7 oz (68.2 kg) (11/12 0500)   Intake/Output Summary (Last 24 hours) at 06/07/11 1048 Last data filed at 06/07/11 0700  Gross per 24 hour  Intake    225 ml  Output   2815 ml  Net  -2590 ml   Subjective Self extubated. Tolerating NRB without excessive WOB. Made NCB last PM  EXAM Gen: NAD, mildly confused ENT: No lesions, no jvd. Her right conjunctiva remains injected  Neck: No JVD, no TMG, no carotid bruits Lungs: diffuse and scattered rhonchi, decreased in both bases. Cardiovascular: RRR, heart sounds normal, 3/6 systolic ejection murmur Abdomen: soft and NT, no HSM,  BS normal Neuro: alert, non focal, appropriate but anxious Ext:no edema, this Refill, warm skin. Skin: Warm, no lesions or rashes  LAB RESULTS Lab Results  Component Value Date   CREATININE 2.95* 06/06/2011   BUN 60* 06/06/2011   NA 144 06/06/2011   K 4.0 06/06/2011   CL 100 06/06/2011   CO2 25 06/06/2011   Lab Results  Component Value Date   WBC 9.3 06/07/2011   HGB 9.7* 06/07/2011   HCT 30.4* 06/07/2011   MCV 95.0  06/07/2011   PLT 244 06/07/2011   ABG    Component Value Date/Time   PHART 7.384 06/07/2011 0500   PCO2ART 55.6* 06/07/2011 0500   PO2ART 82.8 06/07/2011 0500   HCO3 32.4* 06/07/2011 0500   TCO2 34.1 06/07/2011 0500   O2SAT 96.6 06/07/2011 0500   PCXR Edema pattern  Impression/Plan 1) acute respiratory failure, and in the setting of pneumonia, (no organism specified), further complicated by LEFT-sided collapse11/8/12, and mucous plugging. Poss component of edema  Plan: Full DNI now Cont nebs and mucolytics Cont abx  2) Acute on chronic renal insufficiency (05/31/2011). This is slowly improving. Recent Labs  [facility] 06/06/11 0615 06/05/11 0545   CREATININE 2.95* 3.54*  plan -keep euvolemic, followup chemistry in the morning   [name]

## 2011-06-07 NOTE — Progress Notes (Signed)
Speech Language Pathology Clinical/Bedside Swallow Evaluation Patient Details  Name: [name] MRN: [id number] DOB: [birth year] Today's Date: 06/07/2011  Past Medical History:  Past Medical History  Diagnosis Date  . Acute on chronic diastolic congestive heart failure 05/28/2011  . Hypertensive heart disease without CHF 05/28/2011  . History of pulmonary embolus (PE) 05/28/2011    2006 following pelvic fracture  . Chronic venous insufficiency 05/28/2011  . Hyperlipidemia 05/28/2011  . Gout 05/28/2011  . Asthma 05/28/2011  . History of GI diverticular bleed 05/28/2011    June 2012  . Atrial fibrillation 05/28/2011  . Chronic kidney disease stage 3 05/28/2011  . Kidney stones   . Osteoporosis   . Pulmonary hypertension 05/28/2011  . GERD (gastroesophageal reflux disease) 05/28/2011  . Mitral regurgitation 05/31/2011  . Secondary hyperparathyroidism (of renal origin)   . Anemia of chronic disease   . History of CVA (cerebrovascular accident)    Assessment/Recommendations/Treatment Plan  t Clinical Impression Statement: Demonstrates 2-3 subtle throat clears after PO ice chip and thin water, however difficult to differentiate from baseline vocal cord behavior of throat clears, aphonic-hoarse vocal quality, and baseline coughing, although not a strong/productive cough with audible thick glottic secretions.  Overall, swallow trigger appeared timely via laryngeal palpation and no overt coughing/indicators of gross aspiration.  Clinical factors contributing to risk of aspiration include aphonia/dysphonia coupled with pulmonary compromise and h/o chronic pulmonary issues.  Discussed findings with Dr. Sung Amabile, who offered team's concern of chronic aspiration leading to chronic/current pulmonary issues.  M.D. prefers to initate a PO diet with close supervision.  Will follow to determine if an objective evaluation is warranted to r/o silent aspiration.  Risk for Aspiration: Moderate Other Related  Risk Factors: History of pneumonia;Decreased management of secretions  Recommendations 1.  Dysphagia 3 (Mechanical soft) & Thin liquids 2.  Whole meds with liquid 3.  Supervision: Staff feed patient 4.  Compensations: Slow rate;Small sips/bites 5.  Oral care QID;Oral care before and after PO  Swallow Study Goals  1.. Patient will consume recommended diet without observed clinical signs of aspiration with: Minimal assistance 2.  Patient will utilize recommended strategies during swallow to increase swallowing safety with: Minimal assistance  Myra Rude, M.S.,CCC-SLP Pager [phone]

## 2011-06-07 NOTE — Progress Notes (Signed)
Physical Therapy Treatment Patient Details Name: [name] MRN: [id number] DOB: 08/07/1921 Today's Date: 06/07/2011  PT Assessment/Plan  PT - Assessment/Plan Comments on Treatment Session: Pt needed +2 assist this treatment session with onset of bilateral knee buckling.  Due to increase assistance needed will update d/c plans and recommend inpatient rehab for further strengthening, mobility, ambulation and transfer training.   PT Plan: Discharge plan needs to be updated PT Frequency: Min 3X/week Follow Up Recommendations: Inpatient Rehab Equipment Recommended: Defer to next venue PT Goals  Acute Rehab PT Goals PT Goal Formulation: With patient PT Goal: Supine/Side to Sit - Progress: Progressing toward goal PT Goal: Sit to Supine/Side - Progress: Progressing toward goal PT Transfer Goal: Sit to Stand/Stand to Sit - Progress: Other (comment) (pt limited due to fatigue) PT Transfer Goal: Bed to Chair/Chair to Bed - Progress: Not met  Vitals: Pt currently on FiO2 40% and SaO2 at 100%.  Pt was intubated 11/10 and self extubated on 11/11.  PT Treatment Precautions/Restrictions  Precautions Precautions: Fall Restrictions Weight Bearing Restrictions: No Mobility (including Balance) Bed Mobility Bed Mobility: Yes Supine to Sit: 4: Min assist;HOB elevated (Comment degrees) (45 degrees) Supine to Sit Details (indicate cue type and reason): Pt needed assistance with trunk and shoulders OOB.  VCs and manual cues for proper technique. Sitting - Scoot to Edge of Bed: 4: Min assist;With rail Sitting - Scoot to [place] of Bed Details (indicate cue type and reason): Pt needed min (A) to maintain balance while advancing hips to EOB.  Manual cues and tactile cues for proper weight shifts left and right.   Transfers Transfers: Yes Sit to Stand: 1: +2 Total assist;Patient percentage (comment);From elevated surface;Without upper extremity assist;From bed ((pt 75%)) Sit to Stand Details  (indicate cue type and reason): Pt needed +2 assist to stand due to balance and bilateral knee buckling spontaneously.  Attempted to stand but pt immediately sat back down on bed due to bilateral knee buckling.   Stand to Sit: 1: +2 Total assist;To chair/3-in-1;Without upper extremity assist;Patient percentage (comment) ((pt 50%)) Stand to Sit Details: Pt needed +2 assist to slowly descend to recliner.  During eccentric control pt's bilateral knees buckled and needed assist to complete transfer. Stand Pivot Transfers: 1: +2 Total assist Stand Pivot Transfer Details (indicate cue type and reason): Pt needed manual assist to prevent spontaneous bilateral knee buckling.  Pt performed pt 40% of transfer. Ambulation/Gait Ambulation/Gait: No Stairs: No Wheelchair Mobility Wheelchair Mobility: No  Posture/Postural Control Posture/Postural Control: Postural limitations Postural Limitations: pt with flexed trunk, forward head and rounded shoulders  Exercise    End of Session PT - End of Session Equipment Utilized During Treatment: Gait belt Activity Tolerance: Patient limited by fatigue Patient left: in chair Nurse Communication: Mobility status for transfers General Behavior During Session: [facility] for tasks performed Cognition: [facility] for tasks performed  Zoe Creasman 06/07/2011, 12:58 PM

## 2011-06-07 NOTE — Progress Notes (Signed)
Saw patient holding onto ET tube, rushed to bedside but pt had self extubated.Informed MD via e-link.Pt placed on non-rebreather and switched to 40% Ventimask by RT.Current SATs 90-95 on the Venti-mask. Pt is alert and oriented x4.  Informed pt's daughter of the situation.Stated will be at bedside later today since she will  take her son to MD appointment in the morning.

## 2011-06-07 NOTE — Progress Notes (Signed)
Chaplain's note:  Visited with pt for follow-up after inquiry showed she had been moved to ICU of 2900.  Pt in chair with breathing mask, snoozing, but came alert when I entered.  Visited briefly with pt due to her condition, and wanting her to rest.  Pt family stayed late into night and expected again soon.  Offered prayer while holding pt hand as accepted.  Prayed for strength, continued healing and patience.  Will follow-up as need or requested.  Personal pgr [phone].

## 2011-06-07 NOTE — Progress Notes (Signed)
Fentanyl gtt turned off after pt self-extubated.

## 2011-06-07 NOTE — Plan of Care (Signed)
Problem: Phase III Progression Outcomes Goal: Tolerating diet Outcome: Progressing Initiated Dys.3/thin liquid diet- Will closely monitor and determine need for objective evaluation to r/o silent aspiration.

## 2011-06-07 NOTE — Progress Notes (Signed)
Name: [name] MRN: [id number] DOB: 14-Nov-1921  ELECTRONIC ICU PHYSICIAN NOTE  Problem:  Pt's daughter has living will and does not want her mother placed back on a ventilator s/p self extubation  Intervention:  DNR status completed  Sandrea Hughs 06/07/2011, 2:32 AM

## 2011-06-08 DIAGNOSIS — J9819 Other pulmonary collapse: Secondary | ICD-10-CM

## 2011-06-08 DIAGNOSIS — N179 Acute kidney failure, unspecified: Secondary | ICD-10-CM

## 2011-06-08 DIAGNOSIS — J9600 Acute respiratory failure, unspecified whether with hypoxia or hypercapnia: Secondary | ICD-10-CM

## 2011-06-08 MED ORDER — SODIUM CHLORIDE 0.9 % IJ SOLN
3.0000 mL | Freq: Two times a day (BID) | INTRAMUSCULAR | Status: DC
  Administered 2011-06-08 – 2011-06-10 (×5): 3 mL via INTRAVENOUS

## 2011-06-08 MED ORDER — ALBUTEROL SULFATE (5 MG/ML) 0.5% IN NEBU
2.5000 mg | INHALATION_SOLUTION | Freq: Four times a day (QID) | RESPIRATORY_TRACT | Status: DC
  Administered 2011-06-08 – 2011-06-09 (×3): 2.5 mg via RESPIRATORY_TRACT
  Filled 2011-06-08 (×3): qty 0.5

## 2011-06-08 MED ORDER — BIOTENE DRY MOUTH MT LIQD
15.0000 mL | Freq: Two times a day (BID) | OROMUCOSAL | Status: DC
  Administered 2011-06-08 – 2011-06-09 (×5): 15 mL via OROMUCOSAL

## 2011-06-08 NOTE — Progress Notes (Signed)
eLink Physician-Brief Progress Note Patient Name: AYBREE LANYON DOB: [birth year] MRN: [id number]  Date of Service  06/08/2011   HPI/Events of Note   RN feels pt can go to tele  eICU Interventions  tfr to tele instead of SDU      Shan Levans 06/08/2011, 10:48 PM

## 2011-06-08 NOTE — Progress Notes (Signed)
Subjective:  Patient remained extubated overnight and is really feeling much better. She continues to cough but states that her breathing is better. She is not short of breath and has a good appetite. She denies any chest pain. Objective:  Vital Signs in the last 24 hours: Temp:  [97.5 F (36.4 C)-98.8 F (37.1 C)] 98.5 F (36.9 C) (11/13 2000) Pulse Rate:  [62-75] 70  (11/13 1200) Cardiac Rhythm:  [-] Normal sinus rhythm (11/13 0800) Resp:  [10-20] 16  (11/13 1200) BP: (117-145)/(49-78) 117/49 mmHg (11/13 2000) SpO2:  [92 %-100 %] 97 % (11/13 0800) Weight:  [67 kg (147 lb 11.3 oz)] 147 lb 11.3 oz (67 kg) (11/13 0449) 1.  Physical Exam: BP Readings from Last 1 Encounters:  06/08/11 117/49    Wt Readings from Last 1 Encounters:  06/08/11 67 kg (147 lb 11.3 oz)    Weight change: -1.2 kg (-2 lb 10.3 oz)   Elderly white female who is in no acute distress, her respirations are unlabored and she is sitting in thechair at the bedside.She is calm and in no respiratory distress. Lungs: Decreased breath sounds at the left base. Cardiac: Normal S1-S2, occasional early beat. 2/6 systolic murmur radiating from the aortic area up toward the neck. Abdomen: Soft nontender. No edema present.  Intake/Output from previous day: 11/12 0701 - 11/13 0700 In: 170 [P.O.:120; IV Piggyback:50] Out: 690 [Urine:690]  Lab Results: BMET    Component Value Date/Time   NA 147* 06/07/2011 0913   K 3.8 06/07/2011 0913   CL 101 06/07/2011 0913   CO2 34* 06/07/2011 0913   GLUCOSE 104* 06/07/2011 0913   BUN 55* 06/07/2011 0913   CREATININE 2.59* 06/07/2011 0913   CALCIUM 9.7 06/07/2011 0913   GFRNONAA 16* 06/07/2011 0913   GFRAA 18* 06/07/2011 0913   CBC    Component Value Date/Time   WBC 9.3 06/07/2011 0913   RBC 3.20* 06/07/2011 0913   HGB 9.7* 06/07/2011 0913   HCT 30.4* 06/07/2011 0913   PLT 244 06/07/2011 0913   MCV 95.0 06/07/2011 0913   MCH 30.3 06/07/2011 0913   MCHC 31.9 06/07/2011 0913   RDW 14.5 06/07/2011 0913   LYMPHSABS 1.3 06/04/2011 0550   MONOABS 0.9 06/04/2011 0550   EOSABS 0.5 06/04/2011 0550   BASOSABS 0.1 06/04/2011 0550   TELEMETRY  Normal sinus rhythm with occasional PACs. No atrial fibrillation noted. Assessment/Plan:  1. Pneumonia with respiratory failure  Assessment:  She has continued to improve over the past 24 hours. She still has a ways to go. The family has stated that she does not wish to be reintubated but should continue supportive measures for pneumonia at her age.  2. Acute on chronic renal failure likely due to contrast  Assessment: Appears to have peaked and appears to be in the resolving phase. She is diuresing well at this time and the creatinine continues to fall. 3. Atrial fibrillation Assessment: in sinus with PAC's Plan:continue amio. 3. Acute on chronic diastolic heart failure. Assessment: She appears to be resolving and is diuresing quite well at the present time.    LOS: 11 days    [name].  MD [facility] 06/08/2011, 1:45 PM

## 2011-06-08 NOTE — Progress Notes (Signed)
  Patient Description  [name] is an 77 y.o. female. With bronchitis x 5 ds & lt atelectasis on CT, s/p collapse and bronched to open.    Location Start Stop  oett   11/9>>> 11/12 (self)   Culture Date Result  Bal Fungal culture MRSA screen  procalcitonin 11/9>>> 11/9>>> 11/9:  11/11: Mod wbc, no orgs>>> NOF Candida (deemed colonizer) NEG 0.58   Antibiotic Indication Start Stop  vanc ceftazidime  (PNA) (PNA) 11/9>>> 11/9>>> 11/12   GI Prophylaxis DVT Prophylaxis  ppi [place] heparin   Protocols  Sedation 11/9 >> 11/12    Consultants       Date Events       Vitals Temp:  [97.5 F (36.4 C)-98.8 F (37.1 C)] 98.1 F (36.7 C) (11/13 0800) Pulse Rate:  [62-83] 75  (11/13 0800) Resp:  [10-23] 13  (11/13 0800) BP: (120-149)/(40-78) 130/53 mmHg (11/13 0800) SpO2:  [89 %-100 %] 97 % (11/13 0800) FiO2 (%):  [40 %] 40 % (11/12 1200) Weight:  [67 kg (147 lb 11.3 oz)] 147 lb 11.3 oz (67 kg) (11/13 0449)   Intake/Output Summary (Last 24 hours) at 06/08/11 1000 Last data filed at 06/08/11 0603  Gross per 24 hour  Intake    170 ml  Output    690 ml  Net   -520 ml   Subjective Self extubated. Tolerating NRB without excessive WOB. Made NCB last PM  EXAM Gen: NAD, mildly confused ENT: No lesions, no jvd. Her right conjunctiva remains injected  Neck: No JVD, no TMG, no carotid bruits Lungs: diffuse and scattered rhonchi, decreased in both bases. Cardiovascular: RRR, heart sounds normal, 3/6 systolic ejection murmur Abdomen: soft and NT, no HSM,  BS normal Neuro: alert, non focal, appropriate but anxious Ext:no edema, this Refill, warm skin. Skin: Warm, no lesions or rashes  LAB RESULTS Lab Results  Component Value Date   CREATININE 2.59* 06/07/2011   BUN 55* 06/07/2011   NA 147* 06/07/2011   K 3.8 06/07/2011   CL 101 06/07/2011   CO2 34* 06/07/2011   Lab Results  Component Value Date   WBC 9.3 06/07/2011   HGB 9.7* 06/07/2011   HCT 30.4*  06/07/2011   MCV 95.0 06/07/2011   PLT 244 06/07/2011   ABG    Component Value Date/Time   PHART 7.384 06/07/2011 0500   PCO2ART 55.6* 06/07/2011 0500   PO2ART 82.8 06/07/2011 0500   HCO3 32.4* 06/07/2011 0500   TCO2 34.1 06/07/2011 0500   O2SAT 96.6 06/07/2011 0500   PCXR Edema pattern  Impression/Plan 1) acute respiratory failure, and in the setting of pneumonia, (no organism specified), further complicated by LEFT-sided collapse11/8/12, and mucous plugging. Poss component of edema  Plan: Full DNI now Cont nebs and mucolytics Today will be total of 10 days of various abx - will D/C and monitor off abx  2) Acute on chronic renal insufficiency (05/31/2011). This is slowly improving. Recent Labs  [facility] 06/07/11 0913 06/06/11 0615   CREATININE 2.59* 2.95*  plan Recheck BMET AM 11/14  3) Transfer to regular bed   Billy Fischer

## 2011-06-08 NOTE — Plan of Care (Signed)
Problem: Phase I Progression Outcomes Goal: EF % per last Echo/documented,Core Reminder form on chart Outcome: Completed/Met Date Met:  06/08/11 55-60%     

## 2011-06-08 NOTE — Brief Op Note (Signed)
06/01/2011  1:27 PM  Pl see bronchoscopy note for details

## 2011-06-08 NOTE — Progress Notes (Signed)
Chaplain's Note:  Follow-up to check on pt progress.  Pt sleeping in chair.  Will follow-up as needed or requested.

## 2011-06-08 NOTE — Progress Notes (Signed)
Speech Pathology: Dysphagia Treatment Note  Patient was observed with : Soft and Thin liquids.  Patient was noted to have s/s of aspiration : No  Lung Sounds:  Rhonchi per RN  Temperature: afebrile  Clinical Impression:  Continues to demonstrate no overt clinical indicators of a pharyngeal dysphagia or aspiration.  Patient's vocal quality is improved today, although still mildly hoarse but less so than 11/12 evaluation.   Recommendations/Plan: 1.  Will continue to follow 1-2 more times to determine if objective evaluation is warranted via lung sounds, diet tolerance, etc.  Pain:   none Intervention Required:   No  Goals: Progressing towards goal  Myra Rude, M.S.,CCC-SLP Pager [phone]

## 2011-06-09 ENCOUNTER — Inpatient Hospital Stay (HOSPITAL_COMMUNITY): Payer: Medicare Other

## 2011-06-09 DIAGNOSIS — R5381 Other malaise: Secondary | ICD-10-CM

## 2011-06-09 DIAGNOSIS — J9600 Acute respiratory failure, unspecified whether with hypoxia or hypercapnia: Secondary | ICD-10-CM

## 2011-06-09 DIAGNOSIS — J9819 Other pulmonary collapse: Secondary | ICD-10-CM

## 2011-06-09 LAB — BASIC METABOLIC PANEL
BUN: 54 mg/dL — ABNORMAL HIGH (ref 6–23)
Chloride: 102 mEq/L (ref 96–112)
GFR calc Af Amer: 22 mL/min — ABNORMAL LOW (ref >90)
GFR calc non Af Amer: 19 mL/min — ABNORMAL LOW (ref >90)
Potassium: 3.8 mEq/L (ref 3.5–5.1)
Sodium: 146 mEq/L — ABNORMAL HIGH (ref 135–145)

## 2011-06-09 LAB — CBC
HCT: 30.8 % — ABNORMAL LOW (ref 36.0–46.0)
Hemoglobin: 9.6 g/dL — ABNORMAL LOW (ref 12.0–15.0)
RBC: 3.18 MIL/uL — ABNORMAL LOW (ref 3.87–5.11)
WBC: 8.8 10*3/uL (ref 4.0–10.5)

## 2011-06-09 MED ORDER — COLCHICINE 0.6 MG PO TABS
0.6000 mg | ORAL_TABLET | Freq: Every day | ORAL | Status: DC | PRN
  Administered 2011-06-09: 0.6 mg via ORAL
  Filled 2011-06-09: qty 1

## 2011-06-09 MED ORDER — AMIODARONE HCL 200 MG PO TABS: 200.0000 mg | ORAL_TABLET | ORAL | Status: DC

## 2011-06-09 MED ORDER — ALBUTEROL SULFATE (5 MG/ML) 0.5% IN NEBU
2.5000 mg | INHALATION_SOLUTION | Freq: Four times a day (QID) | RESPIRATORY_TRACT | Status: DC
  Administered 2011-06-09 – 2011-06-10 (×5): 2.5 mg via RESPIRATORY_TRACT
  Filled 2011-06-09 (×4): qty 0.5

## 2011-06-09 NOTE — Progress Notes (Signed)
Chaplain's Note:  Follow-up visit with pt.  Pt moved to 2000.  Pt alert in chair.  Visited with pt and provided pastoral care through conversation.  Pt spoke about experience of crisis on family.  Pt accepted prayer for continued strength and healing, thankfulness.  Will follow-up as needed or requested.  [phone]

## 2011-06-09 NOTE — Consult Note (Signed)
Physical Medicine and Rehabilitation Consult Reason for Consult:weakness  Referring Phsyician: Critical Care medicine [name] is an 77 y.o. female.   HPI: 77 year old white female with a long complicated history of cardiac disease with congestive heart failure as well as chronic underlying lung disease. Admitted 05/28/2011 with cough x2-3 weeks with increased sputum production. Chest x-ray showed some venous hypertension but no edema. CT of the chest on 11 8 showed left-sided collapse with mucous plug. Patient had been placed on ventilator for respiratory support. Patient with self extubation on 06/07/2011. She was seen by speech and placed on a mechanical soft diet with thin liquids. Noted elevated creatinine level of 2.9 felt to be a chronic versus acute. Patient required minimal assistance to sit at the edge of the bed and total assistance to go from sit to standing position. Ambulation was yet to be tested. Occupational therapy assessment pending. At the recommendations of physical therapy and physician a  consult was made to inpatient rehabilitation services due to deconditioning and felt patient would be a good rehabilitation candidate.  Review of Systems  Constitutional: Positive for malaise/fatigue.  Eyes: Positive for redness. Negative for blurred vision.  Respiratory: Positive for cough, sputum production and shortness of breath.   Cardiovascular: Negative for chest pain.  Gastrointestinal: Positive for nausea.  Genitourinary: Negative for dysuria.  Musculoskeletal: Positive for myalgias and joint pain.  Neurological: Positive for weakness. Negative for dizziness, seizures and headaches.  Psychiatric/Behavioral: Negative.    Past Medical History  Diagnosis Date  . Acute on chronic diastolic congestive heart failure 05/28/2011  . Hypertensive heart disease without CHF 05/28/2011  . History of pulmonary embolus (PE) 05/28/2011    2006 following pelvic fracture  . Chronic  venous insufficiency 05/28/2011  . Hyperlipidemia 05/28/2011  . Gout 05/28/2011  . Asthma 05/28/2011  . History of GI diverticular bleed 05/28/2011    June 2012  . Atrial fibrillation 05/28/2011  . Chronic kidney disease stage 3 05/28/2011  . Kidney stones   . Osteoporosis   . Pulmonary hypertension 05/28/2011  . GERD (gastroesophageal reflux disease) 05/28/2011  . Mitral regurgitation 05/31/2011  . Secondary hyperparathyroidism (of renal origin)   . Anemia of chronic disease   . History of CVA (cerebrovascular accident)    Past Surgical History  Procedure Date  . Partial colectomy     1999 following colonoscopy complication  . Left hip replacement     2004, complicated with dislocation  . Right hip replacement     2005  . Hernia repair   . Bilateral cataract surgery    No family history on file. Social History:  reports that she has never smoked. She has never used smokeless tobacco. She reports that she does not drink alcohol. Her drug history not on file. Allergies:  Allergies  Allergen Reactions  . Lisinopril     REACTION: angioedema  . Primidone Other (See Comments)    No reaction noted  . Clonidine Derivatives Rash   Medications Prior to Admission  Medication Dose Route Frequency Provider Last Rate Last Dose  . 0.9 %  sodium chloride infusion   Intravenous Continuous W Ashley Royalty., MD 5 mL/hr at 06/06/11 0900    . acetylcysteine (MUCOMYST) 20 % nebulizer solution 2 mL  2 mL Nebulization BID Anders Simmonds, NP   2 mL at 06/06/11 0800  . acetylcysteine (MUCOMYST) 20 % nebulizer solution 4 mL  4 mL Nebulization BID Chinita Greenland, PHARMD   4 mL at 06/03/11 2121  .  albuterol (PROVENTIL) (5 MG/ML) 0.5% nebulizer solution 2.5 mg  2.5 mg Nebulization Q6H Billy Fischer, MD   2.5 mg at 06/09/11 9562  . amiodarone (PACERONE) tablet 200 mg  200 mg Per Tube Q M,W,F Mcarthur Rossetti. Feinstein   200 mg at 06/07/11 1159  . antiseptic oral rinse (BIOTENE) solution 15 mL  15 mL Mouth Rinse  BID Billy Fischer, MD   15 mL at 06/08/11 2000  . aspirin 81 MG chewable tablet        81 mg at 06/01/11 1512  . aspirin chewable tablet 81 mg  81 mg Per Tube Daily Mcarthur Rossetti. Feinstein   81 mg at 06/08/11 1030  . cefTAZidime (FORTAZ) 1 g in dextrose 5 % 50 mL IVPB  1 g Intravenous Q24H Marcelino Scot, PHARMD   1 g at 06/08/11 0603  . cholecalciferol (VITAMIN D) tablet 1,000 Units  1,000 Units Oral Daily Rakesh V. Vassie Loll, MD   1,000 Units at 06/08/11 1030  . colchicine tablet 0.6 mg  0.6 mg Oral Daily PRN Elizabeth Deterding   0.6 mg at 06/09/11 0149  . doxazosin (CARDURA) tablet 4 mg  4 mg Per Tube QHS Mcarthur Rossetti. Feinstein   4 mg at 06/08/11 2103  . etomidate (AMIDATE) 2 MG/ML injection        20 mg at 06/04/11 1300  . fentaNYL (SUBLIMAZE) 0.05 MG/ML injection        200 mcg at 06/04/11 1300  . fentaNYL (SUBLIMAZE) 10 mcg/mL in sodium chloride 0.9 % 250 mL infusion  50-300 mcg/hr Intravenous Titrated Mcarthur Rossetti. Feinstein 5 mL/hr at 06/06/11 2000 50 mcg/hr at 06/06/11 2000   And  . fentaNYL (SUBLIMAZE) bolus via infusion 50-100 mcg  50-100 mcg Intravenous Q6H PRN Mcarthur Rossetti. Tyson Alias      . fentaNYL (SUBLIMAZE) injection 100 mcg  100 mcg Intravenous Once Rakesh V. Vassie Loll, MD   25 mcg at 06/01/11 0827  . fentaNYL (SUBLIMAZE) injection 25-50 mcg  25-50 mcg Intravenous Q2H PRN Mcarthur Rossetti. Tyson Alias      . ferrous sulfate 300 (60 FE) MG/5ML syrup 300 mg  300 mg Per Tube BID WC Mcarthur Rossetti. Feinstein   300 mg at 06/08/11 1726  . furosemide (LASIX) injection 40 mg  40 mg Intravenous Q12H Anders Simmonds, NP   40 mg at 06/06/11 2134  . heparin injection 5,000 Units  5,000 Units Subcutaneous Q8H Rakesh V. Vassie Loll, MD   5,000 Units at 06/08/11 2105  . iohexol (OMNIPAQUE) 350 MG/ML injection 60 mL  60 mL Intravenous Once PRN Medication Radiologist   60 mL at 05/29/11 1505  . lidocaine (cardiac) 100 mg/17ml (XYLOCAINE) 20 MG/ML injection 2%           . midazolam (VERSED) 10 MG/2ML injection 10 mg  10 mg Intravenous Once  Rakesh V. Vassie Loll, MD   1 mg at 06/01/11 0820  . midazolam (VERSED) 2 MG/2ML injection        4 mg at 06/04/11 1300  . midazolam (VERSED) 2 MG/2ML injection           . multivitamins ther. w/minerals tablet 1 tablet  1 tablet Oral Daily Billy Fischer, MD   1 tablet at 06/08/11 1000  . ondansetron (ZOFRAN) injection 4 mg  4 mg Intravenous Q8H PRN Sandrea Hughs, MD   4 mg at 06/06/11 2334  . pantoprazole (PROTONIX) EC tablet 40 mg  40 mg Oral QAC lunch Billy Fischer, MD   40 mg at 06/08/11 1203  .  potassium chloride 20 MEQ/15ML (10%) liquid 20 mEq  20 mEq Per Tube Q12H Anders Simmonds, NP   20 mEq at 06/06/11 2134  . pravastatin (PRAVACHOL) tablet 20 mg  20 mg Per Tube q1800 Mcarthur Rossetti. Feinstein   20 mg at 06/08/11 1726  . rocuronium (ZEMURON) 50 MG/5ML injection           . sodium chloride 0.9 % injection 3 mL  3 mL Intravenous Q12H Billy Fischer, MD   3 mL at 06/08/11 2105  . succinylcholine (ANECTINE) 20 MG/ML injection           . DISCONTD: albuterol (PROVENTIL) (5 MG/ML) 0.5% nebulizer solution 2.5 mg  2.5 mg Nebulization Q4H Danford Bad, NP   2.5 mg at 06/05/11 1149  . DISCONTD: albuterol (PROVENTIL) (5 MG/ML) 0.5% nebulizer solution 2.5 mg  2.5 mg Nebulization Q4H Anders Simmonds, NP   2.5 mg at 06/08/11 1640  . DISCONTD: amiodarone (PACERONE) tablet 200 mg  200 mg Oral Q M,W,F W Ashley Royalty., MD   200 mg at 06/04/11 0931  . DISCONTD: amiodarone (PACERONE) tablet 200 mg  200 mg Oral 3 times weekly Rakesh V. Vassie Loll, MD      . DISCONTD: amoxicillin (AMOXIL) capsule 250 mg  250 mg Oral TID W Ashley Royalty., MD   250 mg at 05/30/11 1051  . DISCONTD: antiseptic oral rinse (BIOTENE) solution 15 mL  15 mL Mouth Rinse QID Mcarthur Rossetti. Feinstein   15 mL at 06/07/11 1200  . DISCONTD: aspirin EC tablet 81 mg  81 mg Oral QAM Rakesh V. Vassie Loll, MD   81 mg at 06/04/11 0929  . DISCONTD: benzonatate (TESSALON) capsule 100 mg  100 mg Oral TID PRN W Ashley Royalty., MD   100 mg at 05/31/11 0111  . DISCONTD:  budesonide-formoterol (SYMBICORT) 80-4.5 MCG/ACT inhaler 1 puff  1 puff Inhalation BID W Ashley Royalty., MD   1 puff at 05/30/11 0746  . DISCONTD: cefTAZidime (FORTAZ) 1 g in dextrose 5 % 50 mL IVPB  1 g Intravenous Q8H Daniel J. Feinstein   1 g at 06/04/11 0502  . DISCONTD: chlorhexidine (PERIDEX) 0.12 % solution 15 mL  15 mL Mouth Rinse BID Mcarthur Rossetti. Feinstein   15 mL at 06/06/11 2009  . DISCONTD: diltiazem (CARDIZEM CD) 24 hr capsule 180 mg  180 mg Oral Daily W Ashley Royalty., MD   180 mg at 06/04/11 0930  . DISCONTD: diltiazem (CARDIZEM CD) 24 hr capsule 180 mg  180 mg Oral Daily Rakesh V. Vassie Loll, MD      . DISCONTD: diltiazem (CARDIZEM) 10 mg/ml oral suspension 60 mg  60 mg Oral Q8H Daniel J. Tyson Alias      . DISCONTD: diltiazem (CARDIZEM) 10 mg/ml oral suspension 60 mg  60 mg Per Tube Q8H Daniel J. Feinstein   60 mg at 06/06/11 2134  . DISCONTD: doxazosin (CARDURA) tablet 4 mg  4 mg Oral QHS W Ashley Royalty., MD   4 mg at 06/03/11 2111  . DISCONTD: doxazosin (CARDURA) tablet 4 mg  4 mg Oral QHS Rakesh V. Vassie Loll, MD      . DISCONTD: doxycycline (VIBRA-TABS) tablet 100 mg  100 mg Oral Q12H W Ashley Royalty., MD   100 mg at 05/30/11 1610  . DISCONTD: enoxaparin (LOVENOX) injection 30 mg  30 mg Subcutaneous QHS W Ashley Royalty., MD   30 mg at 05/31/11 0006  . DISCONTD: ferrous sulfate tablet 325 mg  325 mg Oral  BID WC Rakesh V. Vassie Loll, MD   325 mg at 06/04/11 0929  . DISCONTD: furosemide (LASIX) injection 40 mg  40 mg Intramuscular Q8H W Ashley Royalty., MD      . DISCONTD: furosemide (LASIX) injection 40 mg  40 mg Intravenous BID Dannielle Karvonen Ballantine, PHARMD   40 mg at 05/30/11 0857  . DISCONTD: lidocaine (XYLOCAINE) 1 % (with pres) injection    PRN Rakesh V. Vassie Loll, MD   6 mL at 06/01/11 0820  . DISCONTD: lidocaine (XYLOCAINE) 2 % (with pres) injection    PRN Rakesh V. Vassie Loll, MD   10 mL at 06/01/11 0820  . DISCONTD: montelukast (SINGULAIR) tablet 10 mg  10 mg Oral QHS W Ashley Royalty., MD   10 mg at 06/03/11 2110  . DISCONTD: montelukast (SINGULAIR) tablet 10 mg  10 mg Oral QHS Rakesh V. Vassie Loll, MD      . DISCONTD: moxifloxacin (AVELOX) IVPB 400 mg  400 mg Intravenous Q24H Danford Bad, NP   400 mg at 06/02/11 1548  . DISCONTD: multivitamin liquid 5 mL  5 mL Per Tube Daily Dannielle Huh, PHARMD   5 mL at 06/06/11 1012  . DISCONTD: multivitamins ther. w/minerals tablet 1 tablet  1 tablet Oral Daily Rakesh V. Vassie Loll, MD   1 tablet at 06/04/11 1000  . DISCONTD: pantoprazole (PROTONIX) EC tablet 40 mg  40 mg Oral Q1200 W Ashley Royalty., MD   40 mg at 06/03/11 1244  . DISCONTD: pantoprazole (PROTONIX) EC tablet 40 mg  40 mg Oral Q1200 Rakesh V. Vassie Loll, MD      . DISCONTD: pantoprazole sodium (PROTONIX) 40 mg/20 mL oral suspension 40 mg  40 mg Per Tube Q1200 Dannielle Huh, PHARMD   40 mg at 06/06/11 1152  . DISCONTD: pravastatin (PRAVACHOL) tablet 20 mg  20 mg Oral q1800 W Ashley Royalty., MD   20 mg at 06/03/11 1819  . DISCONTD: simvastatin (ZOCOR) tablet 5 mg  5 mg Oral q1800 Rakesh V. Vassie Loll, MD      . DISCONTD: traMADol Janean Sark) tablet 50 mg  50 mg Oral Q6H PRN W Ashley Royalty., MD   50 mg at 05/31/11 1124  . DISCONTD: traMADol (ULTRAM) tablet 50 mg  50 mg Oral Q6H PRN Rakesh V. Vassie Loll, MD      . DISCONTD: traMADol Janean Sark) tablet 50 mg  50 mg Oral Q6H PRN Kalman Shan, MD   50 mg at 06/03/11 2043  . DISCONTD: vancomycin (VANCOCIN) IVPB 1000 mg/200 mL premix  1,000 mg Intravenous Q12H Mcarthur Rossetti. Feinstein   1,000 mg at 06/04/11 0249   Medications Prior to Admission  Medication Sig Dispense Refill  . amiodarone (PACERONE) 200 MG tablet Take 200 mg by mouth 3 (three) times a week. Take one tablet on Monday, Wednesday, and Friday       . aspirin EC 81 MG tablet Take 81 mg by mouth every morning.        . budesonide-formoterol (SYMBICORT) 80-4.5 MCG/ACT inhaler Inhale 2 puffs into the lungs 2 (two) times daily as needed. For shortness of breath        . bumetanide (BUMEX) 2 MG tablet Take 2 mg by mouth daily.        . cholecalciferol (VITAMIN D) 1000 UNITS tablet Take 1,000 Units by mouth daily.        Marland Kitchen diltiazem (CARDIZEM CD) 180 MG 24 hr capsule Take 180 mg by mouth daily.        Marland Kitchen  doxazosin (CARDURA) 4 MG tablet Take 4 mg by mouth at bedtime.        . ferrous sulfate 325 (65 FE) MG tablet Take 325 mg by mouth 2 (two) times daily with a meal.        . Multiple Vitamins-Minerals (MULTIVITAMINS THER. W/MINERALS) TABS Take 1 tablet by mouth daily.        Marland Kitchen omeprazole (PRILOSEC) 20 MG capsule Take 20 mg by mouth daily.        . pravastatin (PRAVACHOL) 20 MG tablet Take 20 mg by mouth every evening.        . montelukast (SINGULAIR) 10 MG tablet Take 10 mg by mouth daily as needed. For shortness of breath       . traMADol (ULTRAM) 50 MG tablet Take 50 mg by mouth every 6 (six) hours as needed. For muscle spasms. Maximum dose= 8 tablets per day         Home: Home Living Lives With: Daughter (2 grandsons) Receives Help From: Family Type of Home: House Home Layout: One level Home Access: Stairs to enter Entrance Stairs-Rails: None Secretary/administrator of Steps: 1 Bathroom Shower/Tub: Engineer, manufacturing systems: Standard Home Adaptive Equipment: Straight cane;Bedside commode/3-in-1;Walker - rolling Additional Comments: was using only the [facility] before admission   Functional History: Prior Function Level of Independence: Independent with basic ADLs;Independent with transfers;Requires assistive device for independence Driving: No Vocation: Unemployed Functional Status:  Mobility: Bed Mobility Bed Mobility: Yes Supine to Sit: 4: Min assist;HOB elevated (Comment degrees) (45 degrees) Supine to Sit Details (indicate cue type and reason): Pt needed assistance with trunk and shoulders OOB.  VCs and manual cues for proper technique. Sitting - Scoot to Edge of Bed: 4: Min assist;With rail Sitting - Scoot to [place] of Bed Details  (indicate cue type and reason): Pt needed min (A) to maintain balance while advancing hips to EOB.  Manual cues and tactile cues for proper weight shifts left and right.   Transfers Transfers: Yes Sit to Stand: 1: +2 Total assist;Patient percentage (comment);From elevated surface;Without upper extremity assist;From bed ((pt 75%)) Sit to Stand Details (indicate cue type and reason): Pt needed +2 assist to stand due to balance and bilateral knee buckling spontaneously.  Attempted to stand but pt immediately sat back down on bed due to bilateral knee buckling.   Stand to Sit: 1: +2 Total assist;To chair/3-in-1;Without upper extremity assist;Patient percentage (comment) ((pt 50%)) Stand to Sit Details: Pt needed +2 assist to slowly descend to recliner.  During eccentric control pt's bilateral knees buckled and needed assist to complete transfer. Stand Pivot Transfers: 1: +2 Total assist Stand Pivot Transfer Details (indicate cue type and reason): Pt needed manual assist to prevent spontaneous bilateral knee buckling.  Pt performed pt 40% of transfer. Ambulation/Gait Ambulation/Gait: No Stairs: No Wheelchair Mobility Wheelchair Mobility: No  ADL:    Cognition: Cognition Arousal/Alertness: Awake/alert Orientation Level: Oriented to person;Oriented to place;Oriented to situation Cognition Arousal/Alertness: Awake/alert Overall Cognitive Status: Appears within functional limits for tasks assessed Orientation Level: Oriented to person;Oriented to place;Oriented to situation  Blood pressure 172/81, pulse 80, temperature 98.2 F (36.8 C), temperature source Oral, resp. rate 14, height 5\' 3"  (1.6 m), weight 68.9 kg (151 lb 14.4 oz), SpO2 93.00%. Physical Exam  Constitutional: She appears distressed.  HENT:  Head: Normocephalic.  Eyes: Pupils are equal, round, and reactive to light.  Neck: Neck supple. No thyromegaly present.  Cardiovascular: Regular rhythm.   Pulmonary/Chest: She has wheezes.  She exhibits  no tenderness.  Abdominal: She exhibits no distension. There is no tenderness.  Musculoskeletal: She exhibits no edema.  Neurological: She is alert. No sensory deficit.       Patient easily fatigued with exam.  Generalized weakness with proximal weakness more significant than distal. Proximally she grossly 3/5 for distally she is 4/5.  Psychiatric: Thought content normal.    Results for orders placed during the hospital encounter of 05/28/11 (from the past 24 hour(s))  CBC     Status: Abnormal   Collection Time   06/09/11  5:54 AM      Component Value Range   WBC 8.8  4.0 - 10.5 (K/uL)   RBC 3.18 (*) 3.87 - 5.11 (MIL/uL)   Hemoglobin 9.6 (*) 12.0 - 15.0 (g/dL)   HCT 16.1 (*) 09.6 - 46.0 (%)   MCV 96.9  78.0 - 100.0 (fL)   MCH 30.2  26.0 - 34.0 (pg)   MCHC 31.2  30.0 - 36.0 (g/dL)   RDW 04.5  40.9 - 81.1 (%)   Platelets 269  150 - 400 (K/uL)   No results found.  Assessment/Plan: Diagnosis: Deconditioning related to acute respiratory failure 1. Does the need for close, 24 hr/day medical supervision in concert with the patient's rehab needs make it unreasonable for this patient to be served in a less intensive setting? Yes 2. Co-Morbidities requiring supervision/potential complications: MR, asthma, afib, hx of CVA 3. Due to bladder management, bowel management, safety, disease management, medication administration and patient education, does the patient require 24 hr/day rehab nursing? Yes 4. Does the patient require coordinated care of a physician, rehab nurse, PT (1-2 hrs/day, 5 days/week) and OT (1-2 hrs/day, 5 days/week) to address physical and functional deficits in the context of the above medical diagnosis(es)? Yes Addressing deficits in the following areas: balance, bathing, bowel/bladder control, dressing, endurance, grooming, locomotion, swallowing, toileting and transferring 5. Can the patient actively participate in an intensive therapy program of at least 3 hrs  of therapy per day at least 5 days per week? Yes 6. The potential for patient to make measurable gains while on inpatient rehab is excellent 7. Anticipated functional outcomes upon discharge from inpatients are sup to mod independent PT, sup to mod ind OT,  8. Estimated rehab length of stay to reach the above functional goals is: 11-14 days 9. Does the patient have adequate social supports to accommodate these discharge functional goals? Yes 10. Anticipated D/C setting: Home 11. Anticipated post D/C treatments: HH therapy 12. Overall Rehab/Functional Prognosis: excellent  RECOMMENDATIONS: This patient's condition is appropriate for continued rehabilitative care in the following setting: CIR Patient has agreed to participate in recommended program. Yes Note that insurance prior authorization may be required for reimbursement for recommended care.  Comment: He should would like to discuss with her daughter whom she lives with. Rehabilitation nurse will followup. I believe she is an excellent inpatient rehabilitation candidate.   ANGIULLI,DANIEL J. 06/09/2011

## 2011-06-09 NOTE — Progress Notes (Signed)
  Patient Description  [name] is an 77 y.o. female. With bronchitis x 5 ds & lt atelectasis on CT, s/p collapse and bronched to open.    Location Start Stop  oett   11/9>>> 11/12 (self)   Culture Date Result  Bal Fungal culture MRSA screen  procalcitonin 11/9>>> 11/9>>> 11/9:  11/11: Mod wbc, no orgs>>> NOF Candida (deemed colonizer) NEG 0.58   Antibiotic Indication Start Stop  vanc ceftazidime  (PNA) (PNA) 11/9>>> 11/9>>> 11/12 11/14   GI Prophylaxis DVT Prophylaxis  ppi [place] heparin   Protocols  Sedation 11/9 >> 11/12    Consultants       Date Events       Vitals Temp:  [98.2 F (36.8 C)-98.6 F (37 C)] 98.2 F (36.8 C) (11/14 0258) Pulse Rate:  [72-88] 88  (11/14 1208) Resp:  [13-16] 14  (11/14 0258) BP: (117-172)/(49-81) 172/81 mmHg (11/14 0258) SpO2:  [90 %-96 %] 95 % (11/14 1208) Weight:  [68.9 kg (151 lb 14.4 oz)] 151 lb 14.4 oz (68.9 kg) (11/14 0258)   Intake/Output Summary (Last 24 hours) at 06/09/11 1325 Last data filed at 06/09/11 0731  Gross per 24 hour  Intake    190 ml  Output    575 ml  Net   -385 ml   Subjective Looks great. No new complaints. PMR eval noted  EXAM Gen: NAD, mildly confused ENT: No lesions, no jvd. Her right conjunctiva remains injected  Neck: No JVD, no TMG, no carotid bruits Lungs: diffuse and scattered rhonchi, decreased in both bases. Cardiovascular: RRR, heart sounds normal, 3/6 systolic ejection murmur Abdomen: soft and NT, no HSM,  BS normal Neuro: alert, non focal, appropriate but anxious Ext:no edema, this Refill, warm skin. Skin: Warm, no lesions or rashes  LAB RESULTS Lab Results  Component Value Date   CREATININE 2.17* 06/09/2011   BUN 54* 06/09/2011   NA 146* 06/09/2011   K 3.8 06/09/2011   CL 102 06/09/2011   CO2 34* 06/09/2011   Lab Results  Component Value Date   WBC 8.8 06/09/2011   HGB 9.6* 06/09/2011   HCT 30.8* 06/09/2011   MCV 96.9 06/09/2011   PLT 269 06/09/2011    ABG    Component Value Date/Time   PHART 7.384 06/07/2011 0500   PCO2ART 55.6* 06/07/2011 0500   PO2ART 82.8 06/07/2011 0500   HCO3 32.4* 06/07/2011 0500   TCO2 34.1 06/07/2011 0500   O2SAT 96.6 06/07/2011 0500   PCXR Edema pattern  Impression/Plan 1) acute respiratory failure, and in the setting of pneumonia, (no organism specified), further complicated by LEFT-sided collapse11/8/12, and mucous plugging. Poss component of edema. Markedly improved to resolved  Plan: Full DNI now Cont nebs Today will be total of 10 days of various abx - will D/C and monitor off abx  2) Acute on chronic renal insufficiency (05/31/2011). This is slowly improving. Recent Labs  [facility] 06/09/11 0554 06/07/11 0913   CREATININE 2.17* 2.59*  plan Recheck BMET AM 11/14  3) No need for Tele - will D/C  4) She is medically ready to go to Rehab center whenever they are ready to take her   Billy Fischer

## 2011-06-09 NOTE — Progress Notes (Signed)
eLink Physician-Brief Progress Note Patient Name: [name] DOB: March 29, 1922 MRN: [id number]  Date of Service  06/09/2011   HPI/Events of Note   Beginning of gout flare  eICU Interventions  Normally has colchicine 0,6 mg po prn ordered for gout flare - order written for 0.6 mg po daily prn gout flare      [name] 06/09/2011, 12:39 AM

## 2011-06-09 NOTE — Progress Notes (Signed)
Physical Therapy Treatment Patient Details Name: [name] MRN: [id number] DOB: [birth year] Today's Date: 06/09/2011  PT Assessment/Plan  PT - Assessment/Plan Comments on Treatment Session: Pt did well today.  She did lower extremity strengthening exrcsies in standing and worked on transfers.  pt excited about going to rehab tomorrow and gettting strong enough to go home PT Plan: Discharge plan remains appropriate Follow Up Recommendations: Inpatient Rehab Equipment Recommended: Defer to next venue PT Goals  Acute Rehab PT Goals PT Transfer Goal: Sit to Stand/Stand to Sit - Progress: Progressing toward goal PT Transfer Goal: Bed to Chair/Chair to Bed - Progress: Progressing toward goal PT Goal: Ambulate - Progress: Progressing toward goal  PT Treatment Precautions/Restrictions  Precautions Precautions: Fall Required Braces or Orthoses: No Restrictions Weight Bearing Restrictions: No Mobility (including Balance) Transfers Sit to Stand: 4: Min assist;From chair/3-in-1 Stand to Sit: 4: Min assist;To chair/3-in-1 Stand Pivot Transfers: 4: Min assist    Exercise  General Exercises - Lower Extremity Hip Flexion/Marching: AROM;5 reps;Both (with walker) Toe Raises: AROM;10 reps;Both (with walker) Heel Raises: Both;AROM;10 reps (with walker) Mini-Sqauts: AROM;5 reps (with walker) Other Exercises Other Exercises: Pt walked in place for 25 seconds with walker before needing to sit down End of Session PT - End of Session Equipment Utilized During Treatment: Gait belt Activity Tolerance: Patient tolerated treatment well Patient left: in chair;with call bell in reach General Behavior During Session: [facility] for tasks performed (Pt motivated to get better) Cognition: [facility] for tasks performed  Judson Roch 06/09/2011, 12:13 PM

## 2011-06-09 NOTE — PMR Pre-admission (Signed)
PMR Admission Coordinator Pre-Admission Assessment  Patient:  [name] is an 77 y.o., female MRN:  [id number] DOB:  May 16, 1922 Height:  Height: 5\' 3"  (160 cm) Weight:  Weight: 68.9 kg (151 lb 14.4 oz)  SS# [id number]  Insurance Information: PRIMARY:Medicare A and B      Policy#:[id number]      Subscriber:pt Benefits:  Phone #:visionshare 06/09/11     Name:automated Eff. Date:11/23/1988     Deduct:$1156      Out of Pocket NWG:NFAO      Life ZHY:QMVH CIR:100%      SNF:20 full days LBD 12/31/10 Outpatient:80%     Co-Pay:20% Home Health:100%       DME:80%     Co-Pay:20% Providers:pt choice  SECONDARY:[employer]      Policy#:[id number]      Subscriber:pt Benefits:  Phone #[phone]    06/09/11 no auth required with medicare primar  Current Medical History:   Patient Admitting Diagnosis:  Deconditioning related to acute respiratory failure  History of Present Illness: Admitted 05/28/11 with cough for 2 to 3 weeks. CT revealed left sided collapse  With mucous plug. Placed on ventilator for support. Self extubated 06/07/11.  ST eval and placed on soft diet with thin liquids. Overall deconditioning.  Patients Past Medical History:   Past Medical History  Diagnosis Date  . Acute on chronic diastolic congestive heart failure 05/28/2011  . Hypertensive heart disease without CHF 05/28/2011  . History of pulmonary embolus (PE) 05/28/2011    2006 following pelvic fracture  . Chronic venous insufficiency 05/28/2011  . Hyperlipidemia 05/28/2011  . Gout 05/28/2011  . Asthma 05/28/2011  . History of GI diverticular bleed 05/28/2011    June 2012  . Atrial fibrillation 05/28/2011  . Chronic kidney disease stage 3 05/28/2011  . Kidney stones   . Osteoporosis   . Pulmonary hypertension 05/28/2011  . GERD (gastroesophageal reflux disease) 05/28/2011  . Mitral regurgitation 05/31/2011  . Secondary hyperparathyroidism (of renal origin)   . Anemia of chronic disease   . History of CVA  (cerebrovascular accident)    Family Medical History:  family history is not on file.   Height and Weight Height: 5\' 3"  (160 cm) Weight: 68.9 kg (151 lb 14.4 oz) Type of Weight: Actual BSA (Calculated - sq m): 1.75 sq meters BMI (Calculated): 27  Weight in (lb) to have BMI = 25: 140.8       Prior Rehab/Hospitalizations: none  Medications PTA Medications:   Medications Prior to Admission  Medication Dose Route Frequency Provider Last Rate Last Dose  . 0.9 %  sodium chloride infusion   Intravenous Continuous W Ashley Royalty., MD 5 mL/hr at 06/06/11 0900    . acetylcysteine (MUCOMYST) 20 % nebulizer solution 2 mL  2 mL Nebulization BID Anders Simmonds, NP   2 mL at 06/06/11 0800  . acetylcysteine (MUCOMYST) 20 % nebulizer solution 4 mL  4 mL Nebulization BID Chinita Greenland, PHARMD   4 mL at 06/03/11 2121  . albuterol (PROVENTIL) (5 MG/ML) 0.5% nebulizer solution 2.5 mg  2.5 mg Nebulization QID Billy Fischer, MD      . amiodarone (PACERONE) tablet 200 mg  200 mg Oral Q M,W,F Billy Fischer, MD      . antiseptic oral rinse (BIOTENE) solution 15 mL  15 mL Mouth Rinse BID Billy Fischer, MD   15 mL at 06/09/11 0800  . aspirin 81 MG chewable tablet        81 mg at 06/01/11 1512  .  aspirin chewable tablet 81 mg  81 mg Per Tube Daily Mcarthur Rossetti. Feinstein   81 mg at 06/09/11 1000  . cholecalciferol (VITAMIN D) tablet 1,000 Units  1,000 Units Oral Daily Rakesh V. Vassie Loll, MD   1,000 Units at 06/09/11 1258  . colchicine tablet 0.6 mg  0.6 mg Oral Daily PRN Elizabeth Deterding   0.6 mg at 06/09/11 0149  . doxazosin (CARDURA) tablet 4 mg  4 mg Per Tube QHS Mcarthur Rossetti. Feinstein   4 mg at 06/08/11 2103  . etomidate (AMIDATE) 2 MG/ML injection        20 mg at 06/04/11 1300  . fentaNYL (SUBLIMAZE) 0.05 MG/ML injection        200 mcg at 06/04/11 1300  . fentaNYL (SUBLIMAZE) injection 100 mcg  100 mcg Intravenous Once Rakesh V. Vassie Loll, MD   25 mcg at 06/01/11 0827  . ferrous sulfate 300 (60 FE) MG/5ML syrup  300 mg  300 mg Per Tube BID WC Mcarthur Rossetti. Feinstein   300 mg at 06/09/11 1138  . furosemide (LASIX) injection 40 mg  40 mg Intravenous Q12H Anders Simmonds, NP   40 mg at 06/06/11 2134  . heparin injection 5,000 Units  5,000 Units Subcutaneous Q8H Rakesh V. Vassie Loll, MD   5,000 Units at 06/09/11 1431  . iohexol (OMNIPAQUE) 350 MG/ML injection 60 mL  60 mL Intravenous Once PRN Medication Radiologist   60 mL at 05/29/11 1505  . lidocaine (cardiac) 100 mg/14ml (XYLOCAINE) 20 MG/ML injection 2%           . midazolam (VERSED) 10 MG/2ML injection 10 mg  10 mg Intravenous Once Rakesh V. Vassie Loll, MD   1 mg at 06/01/11 0820  . midazolam (VERSED) 2 MG/2ML injection        4 mg at 06/04/11 1300  . midazolam (VERSED) 2 MG/2ML injection           . ondansetron (ZOFRAN) injection 4 mg  4 mg Intravenous Q8H PRN Sandrea Hughs, MD   4 mg at 06/06/11 2334  . potassium chloride 20 MEQ/15ML (10%) liquid 20 mEq  20 mEq Per Tube Q12H Anders Simmonds, NP   20 mEq at 06/06/11 2134  . rocuronium (ZEMURON) 50 MG/5ML injection           . sodium chloride 0.9 % injection 3 mL  3 mL Intravenous Q12H Billy Fischer, MD   3 mL at 06/09/11 1000  . succinylcholine (ANECTINE) 20 MG/ML injection           . DISCONTD: albuterol (PROVENTIL) (5 MG/ML) 0.5% nebulizer solution 2.5 mg  2.5 mg Nebulization Q4H Danford Bad, NP   2.5 mg at 06/05/11 1149  . DISCONTD: albuterol (PROVENTIL) (5 MG/ML) 0.5% nebulizer solution 2.5 mg  2.5 mg Nebulization Q4H Anders Simmonds, NP   2.5 mg at 06/08/11 1640  . DISCONTD: albuterol (PROVENTIL) (5 MG/ML) 0.5% nebulizer solution 2.5 mg  2.5 mg Nebulization Q6H Billy Fischer, MD   2.5 mg at 06/09/11 0830  . DISCONTD: amiodarone (PACERONE) tablet 200 mg  200 mg Oral Q M,W,F W Ashley Royalty., MD   200 mg at 06/04/11 0931  . DISCONTD: amiodarone (PACERONE) tablet 200 mg  200 mg Oral 3 times weekly Rakesh V. Vassie Loll, MD      . DISCONTD: amiodarone (PACERONE) tablet 200 mg  200 mg Per Tube Q M,W,F Mcarthur Rossetti. Feinstein   200  mg at 06/09/11 1257  . DISCONTD: amoxicillin (AMOXIL) capsule 250 mg  250 mg Oral  TID W Ashley Royalty., MD   250 mg at 05/30/11 1051  . DISCONTD: antiseptic oral rinse (BIOTENE) solution 15 mL  15 mL Mouth Rinse QID Mcarthur Rossetti. Feinstein   15 mL at 06/07/11 1200  . DISCONTD: aspirin EC tablet 81 mg  81 mg Oral QAM Rakesh V. Vassie Loll, MD   81 mg at 06/04/11 0929  . DISCONTD: benzonatate (TESSALON) capsule 100 mg  100 mg Oral TID PRN W Ashley Royalty., MD   100 mg at 05/31/11 0111  . DISCONTD: budesonide-formoterol (SYMBICORT) 80-4.5 MCG/ACT inhaler 1 puff  1 puff Inhalation BID W Ashley Royalty., MD   1 puff at 05/30/11 0746  . DISCONTD: cefTAZidime (FORTAZ) 1 g in dextrose 5 % 50 mL IVPB  1 g Intravenous Q8H Daniel J. Feinstein   1 g at 06/04/11 0502  . DISCONTD: cefTAZidime (FORTAZ) 1 g in dextrose 5 % 50 mL IVPB  1 g Intravenous Q24H Marcelino Scot, PHARMD   1 g at 06/08/11 0603  . DISCONTD: chlorhexidine (PERIDEX) 0.12 % solution 15 mL  15 mL Mouth Rinse BID Mcarthur Rossetti. Feinstein   15 mL at 06/06/11 2009  . DISCONTD: diltiazem (CARDIZEM CD) 24 hr capsule 180 mg  180 mg Oral Daily W Ashley Royalty., MD   180 mg at 06/04/11 0930  . DISCONTD: diltiazem (CARDIZEM CD) 24 hr capsule 180 mg  180 mg Oral Daily Rakesh V. Vassie Loll, MD      . DISCONTD: diltiazem (CARDIZEM) 10 mg/ml oral suspension 60 mg  60 mg Oral Q8H Daniel J. Tyson Alias      . DISCONTD: diltiazem (CARDIZEM) 10 mg/ml oral suspension 60 mg  60 mg Per Tube Q8H Daniel J. Feinstein   60 mg at 06/06/11 2134  . DISCONTD: doxazosin (CARDURA) tablet 4 mg  4 mg Oral QHS W Ashley Royalty., MD   4 mg at 06/03/11 2111  . DISCONTD: doxazosin (CARDURA) tablet 4 mg  4 mg Oral QHS Rakesh V. Vassie Loll, MD      . DISCONTD: doxycycline (VIBRA-TABS) tablet 100 mg  100 mg Oral Q12H W Ashley Royalty., MD   100 mg at 05/30/11 1610  . DISCONTD: enoxaparin (LOVENOX) injection 30 mg  30 mg Subcutaneous QHS W Ashley Royalty., MD   30 mg at 05/31/11 0006  .  DISCONTD: fentaNYL (SUBLIMAZE) 10 mcg/mL in sodium chloride 0.9 % 250 mL infusion  50-300 mcg/hr Intravenous Titrated Mcarthur Rossetti. Feinstein 5 mL/hr at 06/06/11 2000 50 mcg/hr at 06/06/11 2000  . DISCONTD: fentaNYL (SUBLIMAZE) bolus via infusion 50-100 mcg  50-100 mcg Intravenous Q6H PRN Mcarthur Rossetti. Tyson Alias      . DISCONTD: fentaNYL (SUBLIMAZE) injection 25-50 mcg  25-50 mcg Intravenous Q2H PRN Mcarthur Rossetti. Tyson Alias      . DISCONTD: ferrous sulfate tablet 325 mg  325 mg Oral BID WC Rakesh V. Vassie Loll, MD   325 mg at 06/04/11 0929  . DISCONTD: furosemide (LASIX) injection 40 mg  40 mg Intramuscular Q8H W Ashley Royalty., MD      . DISCONTD: furosemide (LASIX) injection 40 mg  40 mg Intravenous BID Dannielle Karvonen Terril, PHARMD   40 mg at 05/30/11 0857  . DISCONTD: lidocaine (XYLOCAINE) 1 % (with pres) injection    PRN Rakesh V. Vassie Loll, MD   6 mL at 06/01/11 0820  . DISCONTD: lidocaine (XYLOCAINE) 2 % (with pres) injection    PRN Rakesh V. Vassie Loll, MD   10 mL at 06/01/11 0820  .  DISCONTD: montelukast (SINGULAIR) tablet 10 mg  10 mg Oral QHS W Ashley Royalty., MD   10 mg at 06/03/11 2110  . DISCONTD: montelukast (SINGULAIR) tablet 10 mg  10 mg Oral QHS Rakesh V. Vassie Loll, MD      . DISCONTD: moxifloxacin (AVELOX) IVPB 400 mg  400 mg Intravenous Q24H Danford Bad, NP   400 mg at 06/02/11 1548  . DISCONTD: multivitamin liquid 5 mL  5 mL Per Tube Daily Dannielle Huh, PHARMD   5 mL at 06/06/11 1012  . DISCONTD: multivitamins ther. w/minerals tablet 1 tablet  1 tablet Oral Daily Rakesh V. Vassie Loll, MD   1 tablet at 06/04/11 1000  . DISCONTD: multivitamins ther. w/minerals tablet 1 tablet  1 tablet Oral Daily Billy Fischer, MD   1 tablet at 06/09/11 1000  . DISCONTD: pantoprazole (PROTONIX) EC tablet 40 mg  40 mg Oral Q1200 W Ashley Royalty., MD   40 mg at 06/03/11 1244  . DISCONTD: pantoprazole (PROTONIX) EC tablet 40 mg  40 mg Oral Q1200 Rakesh V. Vassie Loll, MD      . DISCONTD: pantoprazole (PROTONIX) EC tablet  40 mg  40 mg Oral QAC lunch Billy Fischer, MD   40 mg at 06/09/11 1257  . DISCONTD: pantoprazole sodium (PROTONIX) 40 mg/20 mL oral suspension 40 mg  40 mg Per Tube Q1200 Dannielle Huh, PHARMD   40 mg at 06/06/11 1152  . DISCONTD: pravastatin (PRAVACHOL) tablet 20 mg  20 mg Oral q1800 W Ashley Royalty., MD   20 mg at 06/03/11 1819  . DISCONTD: pravastatin (PRAVACHOL) tablet 20 mg  20 mg Per Tube q1800 Mcarthur Rossetti. Feinstein   20 mg at 06/08/11 1726  . DISCONTD: simvastatin (ZOCOR) tablet 5 mg  5 mg Oral q1800 Rakesh V. Vassie Loll, MD      . DISCONTD: traMADol Janean Sark) tablet 50 mg  50 mg Oral Q6H PRN W Ashley Royalty., MD   50 mg at 05/31/11 1124  . DISCONTD: traMADol (ULTRAM) tablet 50 mg  50 mg Oral Q6H PRN Rakesh V. Vassie Loll, MD      . DISCONTD: traMADol Janean Sark) tablet 50 mg  50 mg Oral Q6H PRN Kalman Shan, MD   50 mg at 06/03/11 2043  . DISCONTD: vancomycin (VANCOCIN) IVPB 1000 mg/200 mL premix  1,000 mg Intravenous Q12H Mcarthur Rossetti. Feinstein   1,000 mg at 06/04/11 0249   Medications Prior to Admission  Medication Sig Dispense Refill  . amiodarone (PACERONE) 200 MG tablet Take 200 mg by mouth 3 (three) times a week. Take one tablet on Monday, Wednesday, and Friday       . aspirin EC 81 MG tablet Take 81 mg by mouth every morning.        . budesonide-formoterol (SYMBICORT) 80-4.5 MCG/ACT inhaler Inhale 2 puffs into the lungs 2 (two) times daily as needed. For shortness of breath       . bumetanide (BUMEX) 2 MG tablet Take 2 mg by mouth daily.        . cholecalciferol (VITAMIN D) 1000 UNITS tablet Take 1,000 Units by mouth daily.        Marland Kitchen diltiazem (CARDIZEM CD) 180 MG 24 hr capsule Take 180 mg by mouth daily.        Marland Kitchen doxazosin (CARDURA) 4 MG tablet Take 4 mg by mouth at bedtime.        . ferrous sulfate 325 (65 FE) MG tablet Take 325 mg by mouth 2 (two) times daily with  a meal.        . Multiple Vitamins-Minerals (MULTIVITAMINS THER. W/MINERALS) TABS Take 1 tablet by mouth daily.          Marland Kitchen omeprazole (PRILOSEC) 20 MG capsule Take 20 mg by mouth daily.        . pravastatin (PRAVACHOL) 20 MG tablet Take 20 mg by mouth every evening.        . montelukast (SINGULAIR) 10 MG tablet Take 10 mg by mouth daily as needed. For shortness of breath       . traMADol (ULTRAM) 50 MG tablet Take 50 mg by mouth every 6 (six) hours as needed. For muscle spasms. Maximum dose= 8 tablets per day        Current Medications: Current facility-administered medications:0.9 %  sodium chloride infusion, , Intravenous, Continuous, W Ashley Royalty., MD, Last Rate: 5 mL/hr at 06/06/11 0900;  albuterol (PROVENTIL) (5 MG/ML) 0.5% nebulizer solution 2.5 mg, 2.5 mg, Nebulization, QID, Billy Fischer, MD;  amiodarone (PACERONE) tablet 200 mg, 200 mg, Oral, Q M,W,F, Billy Fischer, MD antiseptic oral rinse (BIOTENE) solution 15 mL, 15 mL, Mouth Rinse, BID, Billy Fischer, MD, 15 mL at 06/09/11 0800;  aspirin chewable tablet 81 mg, 81 mg, Per Tube, Daily, Mcarthur Rossetti. Tyson Alias, 81 mg at 06/09/11 1000;  cholecalciferol (VITAMIN D) tablet 1,000 Units, 1,000 Units, Oral, Daily, Rakesh V. Vassie Loll, MD, 1,000 Units at 06/09/11 1258;  colchicine tablet 0.6 mg, 0.6 mg, Oral, Daily PRN, Lanora Manis Deterding, 0.6 mg at 06/09/11 0149 doxazosin (CARDURA) tablet 4 mg, 4 mg, Per Tube, QHS, Mcarthur Rossetti. Tyson Alias, 4 mg at 06/08/11 2103;  ferrous sulfate 300 (60 FE) MG/5ML syrup 300 mg, 300 mg, Per Tube, BID WC, Mcarthur Rossetti. Feinstein, 300 mg at 06/09/11 1138;  heparin injection 5,000 Units, 5,000 Units, Subcutaneous, Q8H, Rakesh V. Vassie Loll, MD, 5,000 Units at 06/09/11 1431;  ondansetron [facility]) injection 4 mg, 4 mg, Intravenous, Q8H PRN, Sandrea Hughs, MD, 4 mg at 06/06/11 2334 sodium chloride 0.9 % injection 3 mL, 3 mL, Intravenous, Q12H, Billy Fischer, MD, 3 mL at 06/09/11 1000;  DISCONTD: albuterol (PROVENTIL) (5 MG/ML) 0.5% nebulizer solution 2.5 mg, 2.5 mg, Nebulization, Q4H, Anders Simmonds, NP, 2.5 mg at 06/08/11 1640;  DISCONTD: albuterol (PROVENTIL)  (5 MG/ML) 0.5% nebulizer solution 2.5 mg, 2.5 mg, Nebulization, Q6H, Billy Fischer, MD, 2.5 mg at 06/09/11 0830 DISCONTD: amiodarone (PACERONE) tablet 200 mg, 200 mg, Per Tube, Q M,W,F, Mcarthur Rossetti. Feinstein, 200 mg at 06/09/11 1257;  DISCONTD: cefTAZidime (FORTAZ) 1 g in dextrose 5 % 50 mL IVPB, 1 g, Intravenous, Q24H, Marcelino Scot, PHARMD, 1 g at 06/08/11 0603 DISCONTD: fentaNYL (SUBLIMAZE) 10 mcg/mL in sodium chloride 0.9 % 250 mL infusion, 50-300 mcg/hr, Intravenous, Titrated, Mcarthur Rossetti. Tyson Alias, Last Rate: 5 mL/hr at 06/06/11 2000, 50 mcg/hr at 06/06/11 2000;  DISCONTD: fentaNYL (SUBLIMAZE) bolus via infusion 50-100 mcg, 50-100 mcg, Intravenous, Q6H PRN, Mcarthur Rossetti. Tyson Alias;  DISCONTD: fentaNYL (SUBLIMAZE) injection 25-50 mcg, 25-50 mcg, Intravenous, Q2H PRN, Mcarthur Rossetti. Tyson Alias DISCONTD: multivitamins ther. w/minerals tablet 1 tablet, 1 tablet, Oral, Daily, Billy Fischer, MD, 1 tablet at 06/09/11 1000;  DISCONTD: pantoprazole (PROTONIX) EC tablet 40 mg, 40 mg, Oral, QAC lunch, Billy Fischer, MD, 40 mg at 06/09/11 1257;  DISCONTD: pravastatin (PRAVACHOL) tablet 20 mg, 20 mg, Per Tube, q1800, Mcarthur Rossetti. Feinstein, 20 mg at 06/08/11 1726  Precautions/Special Needs:    Additional Precautions/Restrictions: Precautions Precautions: Fall Required Braces or Orthoses: No Restrictions Weight Bearing Restrictions: No  Therapy Assessments Current Functional  Level: Cognition Arousal/Alertness: Awake/alert Overall Cognitive Status: Appears within functional limits for tasks assessed Orientation Level: Oriented X4 Cognition Arousal/Alertness: Awake/alert Orientation Level: Oriented X4   PTA: Home Living Lives With: Daughter (2 grandsons) Receives Help From: Family Type of Home: House Home Layout: One level Home Access: Stairs to enter Entrance Stairs-Rails: None Secretary/administrator of Steps: 1 Bathroom Shower/Tub: Engineer, manufacturing systems: Standard Home Adaptive Equipment:  Straight cane;Bedside commode/3-in-1;Walker - rolling Additional Comments: was using only the [facility] before admission  Prior Function Level of Independence: Independent with basic ADLs;Independent with transfers;Requires assistive device for independence Able to Take Stairs?: No Driving: No Vocation: Unemployed Comments:  (very sedentary per daughter) Sensation Light Touch: Appears Intact  ADLs/Mobility:Current P.T. And O.T.    Bed Mobility Bed Mobility: Yes Supine to Sit: 4: Min assist;HOB elevated (Comment degrees) (45 degrees) Supine to Sit Details (indicate cue type and reason): Pt needed assistance with trunk and shoulders OOB.  VCs and manual cues for proper technique. Sitting - Scoot to Edge of Bed: 4: Min assist;With rail Sitting - Scoot to [place] of Bed Details (indicate cue type and reason): Pt needed min (A) to maintain balance while advancing hips to EOB.  Manual cues and tactile cues for proper weight shifts left and right.   Transfers Transfers: Yes Sit to Stand: 4: Min assist;From chair/3-in-1 Sit to Stand Details (indicate cue type and reason): Pt needed +2 assist to stand due to balance and bilateral knee buckling spontaneously.  Attempted to stand but pt immediately sat back down on bed due to bilateral knee buckling.   Stand to Sit: 4: Min assist;To chair/3-in-1 Stand to Sit Details: Pt needed +2 assist to slowly descend to recliner.  During eccentric control pt's bilateral knees buckled and needed assist to complete transfer. Stand Pivot Transfers: 4: Min assist Stand Pivot Transfer Details (indicate cue type and reason): Pt needed manual assist to prevent spontaneous bilateral knee buckling.  Pt performed pt 40% of transfer. Ambulation/Gait Ambulation/Gait: No Stairs: No Wheelchair Mobility Wheelchair Mobility: No Posture/Postural Control Posture/Postural Control: Postural limitations Postural Limitations: pt with flexed trunk, forward head and rounded shoulders    Balance Balance Assessed:  (NT fully, RW in eval, noticeable post lean in standing)  Home Assistive Devices/Equipment:  Home Assistive Devices/Equipment Home Assistive Devices/EquipmentCorporate treasurer  Discharge Planning:  Discharge Planning Living Arrangements: Children Support Systems: Children Assistance Needed: supervision to min assist Do you have any problems obtaining your medications?: No Type of Residence: Private residence Home Care Services: No Patient expects to be discharged to:: home Expected Discharge Date:  (ELOS 11 to 14 days; pt wants to be home Thanksgiving) Case Management Consult Needed: Yes (Comment)  Prior Functional Levels:  Prior Functional Level Bed Mobility: independent Transfers: mod I Mobility - Walk/Wheelchair: mod I cane Upper Body Dressing: independent Lower Body Dressing: independent Grooming: independent Eating/Drinking: independent Toilet Transfer: independent Bladder Continence: independent Bowel Management: independent Stair Climbing: Mod I to supervision Communication: independent Memory: independent Cooking/Meal Prep: daughter Housework: daughter Money Management: daughter Driving: no    Previous Surveyor, minerals:  Previous Hydrologist Arrangements: Children Support Systems: Children Assistance Needed: supervision to min assist Do you have any problems obtaining your medications?: No Type of Residence: Private residence Home Care Services: No Patient expects to be discharged to:: home Expected Discharge Date:  (ELOS 11 to 14 days; pt wants to be home Thanksgiving) Home Environment Number of Levels: one level Previous Home Environment Number of Steps: couple Previous Home Environment Is  Bedroom on Main Floor?: Yes Previous Home Environment Is Bathroom on Main Floor?: Yes   Discharge Living Setting:  Discharge Living Setting Plans for Discharge Living Setting: Lives with (comment) (daughter for 12 years) Discharge Living  Setting Number of Levels: one level Discharge Living Setting Number of Steps: 1 Discharge Living Setting is Bedroom on Main Floor?: Yes Discharge Living Setting is Bathroom on Main Floor?: Yes   Social/Family/Support Systems:  Social/Family/Support Systems Patient Roles: Parent Contact Information: Chinita Greenland Anticipated Caregiver: [name] is daughter Anticipated Industrial/product designer Information: [phone] H and [phone] cell Ability/Limitations of Caregiver: min to s Caregiver Availability: 24/7 Discharge Plan Discussed with Primary Caregiver: Yes Is Caregiver In Agreement with Plan?: Yes Does Caregiver/Family have Issues with Lodging/Transportation while Pt is in Rehab?: No   Goals/Additional Needs:  Goals/Additional Needs Patient/Family Goal for Rehab: supervision P.T.; supervision to min assist O.T. ADLS Pt/Family Agrees to Admission and willing to participate: Yes Program Orientation Provided & Reviewed with Pt/Caregiver Including Roles  & Responsibilities: Yes  Preadmission Screen Completed By:  Clois Dupes, 06/09/2011 2:55 PM  Patient's condition:  This patient's condition remains as documented in the Consult dated 06/09/2011 in which the Rehabilitation Physician determined and documented that the patient's condition is appropriate for intensive rehabilitative care in an inpatient rehabilitation facility.  Preadmission Screen Competed by:Ottie Glazier, RN, Time/Date,06/10/2011 at 0900.   Admission Coordinator:  Clois Dupes, [id number] /Date11/15/12  .

## 2011-06-09 NOTE — Progress Notes (Signed)
I met with patient at bedside and then contacted her daughter, Chinita Greenland, by phone. Both are in agreement to an inpatient rehabilitation stay prior to discharge home with her daughter. Patient is anxious to return home asap.. I will clarify insurance authorization today. Would like to admit tomorrow if patient deemed medically ready for discharge. I will follow up in the a.m.Please call me with any questions. Pager [phone].

## 2011-06-09 NOTE — H&P (Signed)
NAMESULAY, BRYMER NO.:  [id number]  MEDICAL RECORD NO.:  [id number]  LOCATION:  [facility]                         FACILITY:  [facility]  PHYSICIAN:  [name], M.D.DATE OF BIRTH:  July 02, 1922  DATE OF ADMISSION:  05/28/2011                              HISTORY & PHYSICAL   I was asked to see this 77 year old female for evaluation of heart failure in the emergency room.  The patient has a prior history of diastolic congestive heart failure.  The last echo in May showing preserved LV systolic function, but significant pulmonary hypertension. She also has chronic underlying lung disease, hypertensive heart disease, paroxysmal atrial fibrillation, hyperlipidemia, chronic venous insufficiency.  She has a history of pulmonary embolus in the past.  She was hospitalized with a GI bleeding in June thought to be due to diverticular disease and also had gout at that time.  She was continued on amiodarone at that time.  She was just seen in the office a week ago was actually feeling good at that time and did not really have any complaints to me.  She, however, stated that she has been having a cough for about 2-3 weeks and stated was productive of yellowish sputum, although she was not having severe dyspnea.  She saw her primary doctor today because of the cough who felt that she needed to be admitted.  She was brought to the emergency room where she was found to have oxygen saturation of 87% and was admitted to the hospital for treatment of possible heart failure versus bronchitis.  She has not had PND or orthopnea.  She has not had recent edema.  Has not had any significant chest pain.  She has not had syncope recently.  PAST HISTORY:  Remarkable for hyperlipidemia.  She has a history of pulmonary embolus in December 2006, chronic venous insufficiency, hyperlipidemia, gout, GI bleeding due to diverticulosis, osteoarthritis and history of asthma.  PREVIOUS SURGERY:   She has had a partial colectomy for colon cancer, she has had a partial colon surgery, inguinal herniorrhaphy, right knee replacement and left hip replacement.  ALLERGIES:  CLONIDINE causes a rash.  She is intolerant to LISINOPRIL and PRIMIDONE.  WARFARIN as previously caused a GI bleed.  CURRENT MEDICATIONS: 1. Omeprazole 20 mg daily. 2. Aspirin 81 mg daily. 3. Bumetanide 2 mg daily. 4. Singular 10 mg daily. 5. Doxazosin 4 mg at bedtime . 6. Symbicort 80/4.5 b.i.d. 7. Multivitamins daily. 8. Pravastatin 20 mg daily. 9. Vitamin D 1000 units daily. 10.Diltiazem extended release 180 mg daily. 11.Amiodarone 200 mg Monday, Wednesday and Friday. 12.Colchicine 0.6 mg p.r.n. 13.Iron sulfate 325 mg daily. 14.Mucinex 600 mg daily. 15.Tramadol 50 mg p.r.n. 16.Delsym as needed.  SOCIAL HISTORY:  She is a widow and lives with her daughter.  She is a retired Air[employer] pilot.  She has never smoked.  Does not use alcohol to excess.  REVIEW OF SYSTEMS:  Her weight has been stable.  She has chronic malaise and fatigue.  She wears eyeglasses and has previous cataract.  She does not have any skin complaints.  She has developed recent cough and has had some mild yellow sputum production.  ABDOMINAL:  She has not had any  recent GI bleeding.  Has mild constipation.  She has not had any recent edema.  Has significant arthritis.  She has urinary frequency.  She has significant arthritis of the left hip but has a very unsteady gait. Other than as noted above, the remainder review of systems is unremarkable.  EXAMINATION:  GENERAL:  She is a pleasant elderly white female who is currently in no acute distress.  Moderately obese. VITAL SIGNS: Blood pressure is currently 157/70, pulse is currently 70 and regular. SKIN:  Warm and dry.  There is a significant ectropion present on the right eye. EOMI PERRLA.  C and S clear.  Fundi not examined.  Pharynx is negative.  There is jugular venous distention noted  with cannon V waves noted. LUNGS:  Decreased breath sounds bilaterally.  Very faint rales heard. CARDIOVASCULAR:  She has a soft 2/6 blowing systolic murmur at the aortic valve.  There is no diastolic murmur.  No S3 is noted. ABDOMEN:  Soft, and nontender. EXTREMITIES:  She has venous support stockings on.  There is no edema noted.  NEUROLOGIC:  Grossly normal with normal cranial nerves.  Sensory and motor are intact. GU and RECTAL:  Deferred.  LABORATORY DATA:  EKG shows normal sinus rhythm.  Chest x-ray shows pulmonary venous hypertension but no overt pulmonary edema.  Mild cardiomegaly calcification in the aorta.  Her white count was normal. BUN was 27, and creatinine was mildly elevated.  IMPRESSION: 1. Cough is questionable whether this is bronchitis or heart failure. 2. Acute on chronic diastolic heart failure with elevated BNP and     elevated neck veins. 3. Hypertensive heart disease. 4. Stage III chronic kidney disease. 5. Previous history of pulmonary embolus in 2006 following a pelvic     fracture. 6. Previous history of gastrointestinal bleeding due to diverticular     disease.  RECOMMENDATIONS:  She will be placed on DVT prophylaxis.  We will get a D-dimer to be sure she has not had recurrent pulmonary emboli, diurese because of the jugular venous distention.  Recheck echocardiogram.     [name], M.D.     WST/MEDQ  D:  05/28/2011  T:  05/28/2011  Job:  [id number]  cc:   Carilyn Goodpasture, PA  Electronically Signed by Lacretia Nicks. Donnie Aho M.D. on 06/09/2011 05:33:15 PM

## 2011-06-10 ENCOUNTER — Encounter (HOSPITAL_COMMUNITY): Payer: Self-pay | Admitting: Adult Health

## 2011-06-10 ENCOUNTER — Inpatient Hospital Stay (HOSPITAL_COMMUNITY)
Admission: RE | Admit: 2011-06-10 | Discharge: 2011-06-15 | DRG: 945 | Disposition: A | Discharge status: Home Health | Payer: Medicare Other | Source: Ambulatory Visit | Attending: Physical Medicine & Rehabilitation | Admitting: Physical Medicine & Rehabilitation

## 2011-06-10 DIAGNOSIS — M109 Gout, unspecified: Secondary | ICD-10-CM | POA: Diagnosis present

## 2011-06-10 DIAGNOSIS — R5381 Other malaise: Secondary | ICD-10-CM

## 2011-06-10 DIAGNOSIS — I279 Pulmonary heart disease, unspecified: Secondary | ICD-10-CM | POA: Diagnosis present

## 2011-06-10 DIAGNOSIS — Z8673 Personal history of transient ischemic attack (TIA), and cerebral infarction without residual deficits: Secondary | ICD-10-CM

## 2011-06-10 DIAGNOSIS — D638 Anemia in other chronic diseases classified elsewhere: Secondary | ICD-10-CM | POA: Diagnosis present

## 2011-06-10 DIAGNOSIS — N2581 Secondary hyperparathyroidism of renal origin: Secondary | ICD-10-CM | POA: Diagnosis present

## 2011-06-10 DIAGNOSIS — Z5189 Encounter for other specified aftercare: Principal | ICD-10-CM

## 2011-06-10 DIAGNOSIS — I130 Hypertensive heart and chronic kidney disease with heart failure and stage 1 through stage 4 chronic kidney disease, or unspecified chronic kidney disease: Secondary | ICD-10-CM | POA: Diagnosis present

## 2011-06-10 DIAGNOSIS — N179 Acute kidney failure, unspecified: Secondary | ICD-10-CM

## 2011-06-10 DIAGNOSIS — E785 Hyperlipidemia, unspecified: Secondary | ICD-10-CM | POA: Diagnosis present

## 2011-06-10 DIAGNOSIS — I5033 Acute on chronic diastolic (congestive) heart failure: Secondary | ICD-10-CM | POA: Diagnosis present

## 2011-06-10 DIAGNOSIS — K219 Gastro-esophageal reflux disease without esophagitis: Secondary | ICD-10-CM | POA: Diagnosis present

## 2011-06-10 DIAGNOSIS — J9600 Acute respiratory failure, unspecified whether with hypoxia or hypercapnia: Secondary | ICD-10-CM | POA: Diagnosis present

## 2011-06-10 DIAGNOSIS — N183 Chronic kidney disease, stage 3 unspecified: Secondary | ICD-10-CM | POA: Diagnosis present

## 2011-06-10 DIAGNOSIS — I872 Venous insufficiency (chronic) (peripheral): Secondary | ICD-10-CM | POA: Diagnosis present

## 2011-06-10 DIAGNOSIS — J45909 Unspecified asthma, uncomplicated: Secondary | ICD-10-CM | POA: Diagnosis present

## 2011-06-10 DIAGNOSIS — J159 Unspecified bacterial pneumonia: Secondary | ICD-10-CM

## 2011-06-10 DIAGNOSIS — M810 Age-related osteoporosis without current pathological fracture: Secondary | ICD-10-CM | POA: Diagnosis present

## 2011-06-10 DIAGNOSIS — I4891 Unspecified atrial fibrillation: Secondary | ICD-10-CM | POA: Diagnosis present

## 2011-06-10 DIAGNOSIS — Z96649 Presence of unspecified artificial hip joint: Secondary | ICD-10-CM

## 2011-06-10 DIAGNOSIS — I509 Heart failure, unspecified: Secondary | ICD-10-CM | POA: Diagnosis present

## 2011-06-10 LAB — CBC
Hemoglobin: 10.5 g/dL — ABNORMAL LOW (ref 12.0–15.0)
MCHC: 31.3 g/dL (ref 30.0–36.0)
RBC: 3.49 MIL/uL — ABNORMAL LOW (ref 3.87–5.11)
WBC: 10.1 10*3/uL (ref 4.0–10.5)

## 2011-06-10 LAB — CREATININE, SERUM
Creatinine, Ser: 1.71 mg/dL — ABNORMAL HIGH (ref 0.50–1.10)
GFR calc Af Amer: 30 mL/min — ABNORMAL LOW (ref >90)
GFR calc non Af Amer: 26 mL/min — ABNORMAL LOW (ref >90)

## 2011-06-10 MED ORDER — POLYETHYLENE GLYCOL 3350 17 G PO PACK
17.0000 g | PACK | Freq: Every day | ORAL | Status: DC | PRN
  Filled 2011-06-10: qty 1

## 2011-06-10 MED ORDER — ALBUTEROL SULFATE (5 MG/ML) 0.5% IN NEBU
2.5000 mg | INHALATION_SOLUTION | Freq: Four times a day (QID) | RESPIRATORY_TRACT | Status: DC
  Administered 2011-06-10 – 2011-06-15 (×15): 2.5 mg via RESPIRATORY_TRACT
  Filled 2011-06-10 (×15): qty 0.5

## 2011-06-10 MED ORDER — ACETAMINOPHEN 325 MG PO TABS
325.0000 mg | ORAL_TABLET | ORAL | Status: DC | PRN
  Administered 2011-06-11 – 2011-06-13 (×4): 650 mg via ORAL
  Filled 2011-06-10 (×4): qty 2

## 2011-06-10 MED ORDER — GUAIFENESIN-DM 100-10 MG/5ML PO SYRP: 5.0000 mL | ORAL_SOLUTION | Freq: Four times a day (QID) | ORAL | Status: DC | PRN

## 2011-06-10 MED ORDER — AMIODARONE HCL 200 MG PO TABS
200.0000 mg | ORAL_TABLET | ORAL | Status: DC
  Administered 2011-06-11 – 2011-06-14 (×2): 200 mg
  Filled 2011-06-10 (×2): qty 1

## 2011-06-10 MED ORDER — HEPARIN SODIUM (PORCINE) 5000 UNIT/ML IJ SOLN
5000.0000 [IU] | Freq: Three times a day (TID) | INTRAMUSCULAR | Status: DC
  Administered 2011-06-10 – 2011-06-14 (×11): 5000 [IU] via SUBCUTANEOUS
  Filled 2011-06-10 (×14): qty 1

## 2011-06-10 MED ORDER — ALUM & MAG HYDROXIDE-SIMETH 400-400-40 MG/5ML PO SUSP
30.0000 mL | ORAL | Status: DC | PRN
  Filled 2011-06-10: qty 30

## 2011-06-10 MED ORDER — DOXAZOSIN MESYLATE 4 MG PO TABS
4.0000 mg | ORAL_TABLET | Freq: Every day | ORAL | Status: DC
  Administered 2011-06-10 – 2011-06-14 (×5): 4 mg
  Filled 2011-06-10 (×6): qty 1

## 2011-06-10 MED ORDER — WHITE PETROLATUM GEL
Status: AC
  Administered 2011-06-10: 22:00:00
  Filled 2011-06-10: qty 5

## 2011-06-10 MED ORDER — PROMETHAZINE HCL 25 MG/ML IJ SOLN: 12.5000 mg | Freq: Four times a day (QID) | INTRAMUSCULAR | Status: DC | PRN

## 2011-06-10 MED ORDER — SORBITOL 70 % SOLN: 30.0000 mL | Freq: Two times a day (BID) | Status: DC | PRN

## 2011-06-10 MED ORDER — ASPIRIN 81 MG PO CHEW
81.0000 mg | CHEWABLE_TABLET | Freq: Every day | ORAL | Status: DC
  Administered 2011-06-11 – 2011-06-15 (×5): 81 mg
  Filled 2011-06-10 (×5): qty 1

## 2011-06-10 MED ORDER — FERROUS SULFATE 325 (65 FE) MG PO TABS
325.0000 mg | ORAL_TABLET | Freq: Two times a day (BID) | ORAL | Status: DC
  Filled 2011-06-10 (×3): qty 1

## 2011-06-10 MED ORDER — PROMETHAZINE HCL 12.5 MG RE SUPP: 12.5000 mg | Freq: Four times a day (QID) | RECTAL | Status: DC | PRN

## 2011-06-10 MED ORDER — VITAMIN D3 25 MCG (1000 UNIT) PO TABS
1000.0000 [IU] | ORAL_TABLET | Freq: Every day | ORAL | Status: DC
  Administered 2011-06-11 – 2011-06-15 (×5): 1000 [IU] via ORAL
  Filled 2011-06-10 (×6): qty 1

## 2011-06-10 MED ORDER — SENNOSIDES-DOCUSATE SODIUM 8.6-50 MG PO TABS
2.0000 | ORAL_TABLET | Freq: Every day | ORAL | Status: DC
  Administered 2011-06-10 – 2011-06-13 (×4): 2 via ORAL
  Filled 2011-06-10 (×5): qty 2

## 2011-06-10 MED ORDER — TRAZODONE HCL 50 MG PO TABS: 25.0000 mg | ORAL_TABLET | Freq: Every evening | ORAL | Status: DC | PRN

## 2011-06-10 MED ORDER — PROMETHAZINE HCL 12.5 MG PO TABS: 12.5000 mg | ORAL_TABLET | Freq: Four times a day (QID) | ORAL | Status: DC | PRN

## 2011-06-10 NOTE — Progress Notes (Signed)
Subjective:  Patient now on tele floor, but off telemetry.  Strength improved.  Objective:   BP  150/80, Pulse 70 regular  Physical exam  Elderly white female who is in no acute distress, her respirations are unlabored and she is sitting in thechair at the bedside.She is calm and in no respiratory distress. Lungs: Decreased breath sounds at the left base. Cardiac: Normal S1-S2, occasional early beat. 2/6 systolic murmur radiating from the aortic area up toward the neck. Abdomen: Soft nontender. No edema present.   Assessment/Plan:  1. Pneumonia with respiratory failure  Assessment:improved.   2. Acute on chronic renal failure likely due to contrast  Assessment: Appears to have peaked and appears to be in the resolving phase. She is diuresing well at this time and the creatinine continues to fall. 3. Atrial fibrillation Assessment: in sinus with PAC's Plan:continue amio.  3. Acute on chronic diastolic heart failure.  Assessment: She appears to be resolving and is diuresing quite well at the present time.   LOS: 11 days  W. [name], [name]. MD [facility]  06/09/11  2:30 pm

## 2011-06-10 NOTE — H&P (Signed)
Physical Medicine and Rehabilitation Admission H&P  [name] is an 77 y.o. female with a long complicated history of cardiac disease with congestive heart failure as well as chronic underlying lung disease. Admitted 05/28/2011 with cough x2-3 weeks with increased sputum production. Chest x-ray showed some venous hypertension but no edema. CT of the chest on 11/ 8 showed left-sided collapse with mucous plug. Patient had been placed on ventilator for respiratory support. Patient with self extubation on 06/07/2011.  Chest x-ray 11/14 with improved aeration.She was seen by speech and placed on a mechanical soft diet with thin liquids. Noted elevated creatinine level of 2.9 felt to be a chronic versus acute. Patient required minimal assistance to sit at the edge of the bed and total assistance to go from sit to standing position. Ambulated in place for 25 seconds. Occupational therapy assessment pending. At the recommendations of physical therapy and physician a consult was made to inpatient rehabilitation services due to deconditioning and felt patient would be a good rehabilitation candidate.   Review of Systems  Constitutional: Positive for malaise/fatigue.  Eyes: Positive for redness. Negative for blurred vision.  Respiratory: Positive for cough, sputum production and shortness of breath.  Cardiovascular: Negative for chest pain.  Gastrointestinal: Positive for nausea.  Genitourinary: Negative for dysuria.  Musculoskeletal: Positive for myalgias and joint pain.  Neurological: Positive for weakness. Negative for dizziness, seizures and headaches.  Psychiatric/Behavioral: Negative.      Past Medical History  Diagnosis Date  . Acute on chronic diastolic congestive heart failure 05/28/2011  . Hypertensive heart disease without CHF 05/28/2011  . History of pulmonary embolus (PE) 05/28/2011    2006 following pelvic fracture  . Chronic venous insufficiency 05/28/2011  . Hyperlipidemia  05/28/2011  . Gout 05/28/2011  . Asthma 05/28/2011  . History of GI diverticular bleed 05/28/2011    June 2012  . Atrial fibrillation 05/28/2011  . Chronic kidney disease stage 3 05/28/2011  . Kidney stones   . Osteoporosis   . Pulmonary hypertension 05/28/2011  . GERD (gastroesophageal reflux disease) 05/28/2011  . Mitral regurgitation 05/31/2011  . Secondary hyperparathyroidism (of renal origin)   . Anemia of chronic disease   . History of CVA (cerebrovascular accident)    Past Surgical History  Procedure Date  . Partial colectomy     1999 following colonoscopy complication  . Left hip replacement     2004, complicated with dislocation  . Right hip replacement     2005  . Hernia repair   . Bilateral cataract surgery    No family history on file. Social History:  reports that she has never smoked. She has never used smokeless tobacco. She reports that she does not drink alcohol. Her drug history not on file. Allergies:  Allergies  Allergen Reactions  . Lisinopril     REACTION: angioedema  . Primidone Other (See Comments)    No reaction noted  . Clonidine Derivatives Rash   Medications Prior to Admission  Medication Dose Route Frequency Provider Last Rate Last Dose  . acetaminophen (TYLENOL) tablet 325-650 mg  325-650 mg Oral Q4H PRN Mcarthur Rossetti. Angiulli, PA      . albuterol (PROVENTIL) (5 MG/ML) 0.5% nebulizer solution 2.5 mg  2.5 mg Nebulization Q6H Daniel J. Angiulli, PA      . alum & mag hydroxide-simeth (MAALOX PLUS) 400-400-40 MG/5ML suspension 30 mL  30 mL Oral Q4H PRN Mcarthur Rossetti. Angiulli, PA      . amiodarone (PACERONE) tablet 200 mg  200  mg Per Tube Q M,W,F Mcarthur Rossetti. Angiulli, PA      . aspirin chewable tablet 81 mg  81 mg Per Tube Daily Mcarthur Rossetti. Angiulli, PA      . cholecalciferol (VITAMIN D) tablet 1,000 Units  1,000 Units Oral Daily Mcarthur Rossetti. Angiulli, PA      . doxazosin (CARDURA) tablet 4 mg  4 mg Per Tube QHS Mcarthur Rossetti. Angiulli, PA      .  guaiFENesin-dextromethorphan (ROBITUSSIN DM) 100-10 MG/5ML syrup 5-10 mL  5-10 mL Oral Q6H PRN Mcarthur Rossetti. Angiulli, PA      . heparin injection 5,000 Units  5,000 Units Subcutaneous Q8H Daniel J. Angiulli, PA      . polyethylene glycol (MIRALAX / GLYCOLAX) packet 17 g  17 g Oral Daily PRN Mcarthur Rossetti. Angiulli, PA      . promethazine (PHENERGAN) tablet 12.5 mg  12.5 mg Oral Q6H PRN Mcarthur Rossetti. Angiulli, PA       Or  . promethazine (PHENERGAN) suppository 12.5 mg  12.5 mg Rectal Q6H PRN Mcarthur Rossetti. Angiulli, PA       Or  . promethazine (PHENERGAN) injection 12.5 mg  12.5 mg Intramuscular Q6H PRN Mcarthur Rossetti. Angiulli, PA      . senna-docusate (Senokot-S) tablet 2 tablet  2 tablet Oral QHS Mcarthur Rossetti. Angiulli, PA      . sorbitol 70 % solution 30 mL  30 mL Oral Q12H PRN Mcarthur Rossetti. Angiulli, PA      . traZODone (DESYREL) tablet 25-50 mg  25-50 mg Oral QHS PRN Mcarthur Rossetti. Angiulli, PA      . DISCONTD: 0.9 %  sodium chloride infusion   Intravenous Continuous W Ashley Royalty., MD 5 mL/hr at 06/06/11 0900    . DISCONTD: albuterol (PROVENTIL) (5 MG/ML) 0.5% nebulizer solution 2.5 mg  2.5 mg Nebulization Q6H Billy Fischer, MD   2.5 mg at 06/09/11 0830  . DISCONTD: albuterol (PROVENTIL) (5 MG/ML) 0.5% nebulizer solution 2.5 mg  2.5 mg Nebulization QID Billy Fischer, MD   2.5 mg at 06/10/11 1215  . DISCONTD: amiodarone (PACERONE) tablet 200 mg  200 mg Per Tube Q M,W,F Mcarthur Rossetti. Feinstein   200 mg at 06/09/11 1257  . DISCONTD: amiodarone (PACERONE) tablet 200 mg  200 mg Oral Q M,W,F Billy Fischer, MD      . DISCONTD: antiseptic oral rinse (BIOTENE) solution 15 mL  15 mL Mouth Rinse BID Billy Fischer, MD   15 mL at 06/09/11 2000  . DISCONTD: aspirin chewable tablet 81 mg  81 mg Per Tube Daily Mcarthur Rossetti. Feinstein   81 mg at 06/10/11 1022  . DISCONTD: cefTAZidime (FORTAZ) 1 g in dextrose 5 % 50 mL IVPB  1 g Intravenous Q24H Marcelino Scot, PHARMD   1 g at 06/08/11 0603  . DISCONTD: cholecalciferol (VITAMIN D) tablet  1,000 Units  1,000 Units Oral Daily Rakesh V. Vassie Loll, MD   1,000 Units at 06/10/11 1022  . DISCONTD: colchicine tablet 0.6 mg  0.6 mg Oral Daily PRN Elizabeth Deterding   0.6 mg at 06/09/11 0149  . DISCONTD: doxazosin (CARDURA) tablet 4 mg  4 mg Per Tube QHS Mcarthur Rossetti. Feinstein   4 mg at 06/09/11 2211  . DISCONTD: fentaNYL (SUBLIMAZE) 10 mcg/mL in sodium chloride 0.9 % 250 mL infusion  50-300 mcg/hr Intravenous Titrated Mcarthur Rossetti. Feinstein 5 mL/hr at 06/06/11 2000 50 mcg/hr at 06/06/11 2000  . DISCONTD: fentaNYL (SUBLIMAZE) bolus via infusion 50-100 mcg  50-100 mcg Intravenous  Q6H PRN Mcarthur Rossetti. Tyson Alias      . DISCONTD: fentaNYL (SUBLIMAZE) injection 25-50 mcg  25-50 mcg Intravenous Q2H PRN Mcarthur Rossetti. Tyson Alias      . DISCONTD: ferrous sulfate 300 (60 FE) MG/5ML syrup 300 mg  300 mg Per Tube BID WC Mcarthur Rossetti. Feinstein   300 mg at 06/10/11 0839  . DISCONTD: ferrous sulfate tablet 325 mg  325 mg Oral BID WC Nita Altamese Holton, PHARMD      . DISCONTD: heparin injection 5,000 Units  5,000 Units Subcutaneous Q8H Rakesh V. Vassie Loll, MD   5,000 Units at 06/10/11 0550  . DISCONTD: multivitamins ther. w/minerals tablet 1 tablet  1 tablet Oral Daily Billy Fischer, MD   1 tablet at 06/09/11 1000  . DISCONTD: ondansetron (ZOFRAN) injection 4 mg  4 mg Intravenous Q8H PRN Sandrea Hughs, MD   4 mg at 06/06/11 2334  . DISCONTD: pantoprazole (PROTONIX) EC tablet 40 mg  40 mg Oral QAC lunch Billy Fischer, MD   40 mg at 06/09/11 1257  . DISCONTD: pravastatin (PRAVACHOL) tablet 20 mg  20 mg Per Tube q1800 Mcarthur Rossetti. Feinstein   20 mg at 06/08/11 1726  . DISCONTD: sodium chloride 0.9 % injection 3 mL  3 mL Intravenous Q12H Billy Fischer, MD   3 mL at 06/10/11 1000   Medications Prior to Admission  Medication Sig Dispense Refill  . amiodarone (PACERONE) 200 MG tablet Take 200 mg by mouth 3 (three) times a week. Take one tablet on Monday, Wednesday, and Friday       . aspirin EC 81 MG tablet Take 81 mg by mouth every morning.         . budesonide-formoterol (SYMBICORT) 80-4.5 MCG/ACT inhaler Inhale 2 puffs into the lungs 2 (two) times daily as needed. For shortness of breath       . bumetanide (BUMEX) 2 MG tablet Take 2 mg by mouth daily.        . cholecalciferol (VITAMIN D) 1000 UNITS tablet Take 1,000 Units by mouth daily.        Marland Kitchen diltiazem (CARDIZEM CD) 180 MG 24 hr capsule Take 180 mg by mouth daily.        Marland Kitchen doxazosin (CARDURA) 4 MG tablet Take 4 mg by mouth at bedtime.        . ferrous sulfate 325 (65 FE) MG tablet Take 325 mg by mouth 2 (two) times daily with a meal.        . montelukast (SINGULAIR) 10 MG tablet Take 10 mg by mouth daily as needed. For shortness of breath       . Multiple Vitamins-Minerals (MULTIVITAMINS THER. W/MINERALS) TABS Take 1 tablet by mouth daily.        Marland Kitchen omeprazole (PRILOSEC) 20 MG capsule Take 20 mg by mouth daily.        . pravastatin (PRAVACHOL) 20 MG tablet Take 20 mg by mouth every evening.        . traMADol (ULTRAM) 50 MG tablet Take 50 mg by mouth every 6 (six) hours as needed. For muscle spasms. Maximum dose= 8 tablets per day        PMR Admission Coordinator Pre-Admission Assessment  Patient: KAWEHI HOSTETTER is an 77 y.o., female  MRN: [id number]  DOB: 01/09/1922  Height: Height: 5\' 3"  (160 cm)  Weight: Weight: 68.9 kg (151 lb 14.4 oz)  SS# [id number]  Insurance Information:  PRIMARY:Medicare A and B Policy#:[id number] Subscriber:pt  Benefits: Phone #:visionshare 06/09/11 Name:automated  Eff. Date:11/23/1988  Deduct:$1156 Out of Pocket ZOX:WRUE Life AVW:UJWJ  CIR:100% SNF:20 full days LBD 12/31/10  Outpatient:80% Co-Pay:20%  Home Health:100%  DME:80% Co-Pay:20%  Providers:pt choice   SECONDARY:[employer] Policy#:[id number] Subscriber:pt  Benefits: Phone #:[phone] 06/09/11 no auth required with medicare primar  Current Medical History:  Patient Admitting Diagnosis: Deconditioning related to acute respiratory failure  History of Present Illness: Admitted  05/28/11 with cough for 2 to 3 weeks. CT revealed left sided collapse With mucous plug. Placed on ventilator for support. Self extubated 06/07/11. ST eval and placed on soft diet with thin liquids. Overall deconditioning.  Patients Past Medical History:    Past Medical History    Diagnosis  Date    .  Acute on chronic diastolic congestive heart failure  05/28/2011    .  Hypertensive heart disease without CHF  05/28/2011    .  History of pulmonary embolus (PE)  05/28/2011      2006 following pelvic fracture    .  Chronic venous insufficiency  05/28/2011    .  Hyperlipidemia  05/28/2011    .  Gout  05/28/2011    .  Asthma  05/28/2011    .  History of GI diverticular bleed  05/28/2011      June 2012    .  Atrial fibrillation  05/28/2011    .  Chronic kidney disease stage 3  05/28/2011    .  Kidney stones     .  Osteoporosis     .  Pulmonary hypertension  05/28/2011    .  GERD (gastroesophageal reflux disease)  05/28/2011    .  Mitral regurgitation  05/31/2011    .  Secondary hyperparathyroidism (of renal origin)     .  Anemia of chronic disease     .  History of CVA (cerebrovascular accident)      Family Medical History: family history is not on file.  Height and Weight  Height: 5\' 3"  (160 cm)  Weight: 68.9 kg (151 lb 14.4 oz)  Type of Weight: Actual  BSA (Calculated - sq m): 1.75 sq meters  BMI (Calculated): 27  Weight in (lb) to have BMI = 25: 140.8   Prior Rehab/Hospitalizations: none  Medications  PTA Medications:    Medications Prior to Admission    Medication  Dose  Route  Frequency  Provider  Last Rate  Last Dose    .  0.9 % sodium chloride infusion   Intravenous  Continuous  W Ashley Royalty., MD  5 mL/hr at 06/06/11 0900     .  acetylcysteine (MUCOMYST) 20 % nebulizer solution 2 mL  2 mL  Nebulization  BID  Anders Simmonds, NP   2 mL at 06/06/11 0800    .  acetylcysteine (MUCOMYST) 20 % nebulizer solution 4 mL  4 mL  Nebulization  BID  Chinita Greenland, PHARMD   4 mL at 06/03/11  2121    .  albuterol (PROVENTIL) (5 MG/ML) 0.5% nebulizer solution 2.5 mg  2.5 mg  Nebulization  QID  Billy Fischer, MD      .  amiodarone (PACERONE) tablet 200 mg  200 mg  Oral  Q M,W,F  Billy Fischer, MD      .  antiseptic oral rinse (BIOTENE) solution 15 mL  15 mL  Mouth Rinse  BID  Billy Fischer, MD   15 mL at 06/09/11 0800    .  aspirin 81 MG chewable tablet       81 mg  at 06/01/11 1512    .  aspirin chewable tablet 81 mg  81 mg  Per Tube  Daily  Mcarthur Rossetti. Feinstein   81 mg at 06/09/11 1000    .  cholecalciferol (VITAMIN D) tablet 1,000 Units  1,000 Units  Oral  Daily  Rakesh V. Vassie Loll, MD   1,000 Units at 06/09/11 1258    .  colchicine tablet 0.6 mg  0.6 mg  Oral  Daily PRN  Elizabeth Deterding   0.6 mg at 06/09/11 0149    .  doxazosin (CARDURA) tablet 4 mg  4 mg  Per Tube  QHS  Mcarthur Rossetti. Feinstein   4 mg at 06/08/11 2103    .  etomidate (AMIDATE) 2 MG/ML injection       20 mg at 06/04/11 1300    .  fentaNYL (SUBLIMAZE) 0.05 MG/ML injection       200 mcg at 06/04/11 1300    .  fentaNYL (SUBLIMAZE) injection 100 mcg  100 mcg  Intravenous  Once  Rakesh V. Vassie Loll, MD   25 mcg at 06/01/11 0827    .  ferrous sulfate 300 (60 FE) MG/5ML syrup 300 mg  300 mg  Per Tube  BID WC  Mcarthur Rossetti. Feinstein   300 mg at 06/09/11 1138    .  furosemide (LASIX) injection 40 mg  40 mg  Intravenous  Q12H  Anders Simmonds, NP   40 mg at 06/06/11 2134    .  heparin injection 5,000 Units  5,000 Units  Subcutaneous  Q8H  Rakesh V. Vassie Loll, MD   5,000 Units at 06/09/11 1431    .  iohexol (OMNIPAQUE) 350 MG/ML injection 60 mL  60 mL  Intravenous  Once PRN  Medication Radiologist   60 mL at 05/29/11 1505    .  lidocaine (cardiac) 100 mg/41ml (XYLOCAINE) 20 MG/ML injection 2%          .  midazolam (VERSED) 10 MG/2ML injection 10 mg  10 mg  Intravenous  Once  Rakesh V. Vassie Loll, MD   1 mg at 06/01/11 0820    .  midazolam (VERSED) 2 MG/2ML injection       4 mg at 06/04/11 1300    .  midazolam (VERSED) 2 MG/2ML injection          .   ondansetron (ZOFRAN) injection 4 mg  4 mg  Intravenous  Q8H PRN  Sandrea Hughs, MD   4 mg at 06/06/11 2334    .  potassium chloride 20 MEQ/15ML (10%) liquid 20 mEq  20 mEq  Per Tube  Q12H  Anders Simmonds, NP   20 mEq at 06/06/11 2134    .  rocuronium (ZEMURON) 50 MG/5ML injection          .  sodium chloride 0.9 % injection 3 mL  3 mL  Intravenous  Q12H  Billy Fischer, MD   3 mL at 06/09/11 1000    .  succinylcholine (ANECTINE) 20 MG/ML injection          .  DISCONTD: albuterol (PROVENTIL) (5 MG/ML) 0.5% nebulizer solution 2.5 mg  2.5 mg  Nebulization  Q4H  Danford Bad, NP   2.5 mg at 06/05/11 1149    .  DISCONTD: albuterol (PROVENTIL) (5 MG/ML) 0.5% nebulizer solution 2.5 mg  2.5 mg  Nebulization  Q4H  Anders Simmonds, NP   2.5 mg at 06/08/11 1640    .  DISCONTD: albuterol (PROVENTIL) (5 MG/ML) 0.5% nebulizer solution  2.5 mg  2.5 mg  Nebulization  Q6H  Billy Fischer, MD   2.5 mg at 06/09/11 0830    .  DISCONTD: amiodarone (PACERONE) tablet 200 mg  200 mg  Oral  Q M,W,F  W Ashley Royalty., MD   200 mg at 06/04/11 0931    .  DISCONTD: amiodarone (PACERONE) tablet 200 mg  200 mg  Oral  3 times weekly  Rakesh V. Vassie Loll, MD      .  DISCONTD: amiodarone (PACERONE) tablet 200 mg  200 mg  Per Tube  Q M,W,F  Mcarthur Rossetti. Feinstein   200 mg at 06/09/11 1257    .  DISCONTD: amoxicillin (AMOXIL) capsule 250 mg  250 mg  Oral  TID  W Ashley Royalty., MD   250 mg at 05/30/11 1051    .  DISCONTD: antiseptic oral rinse (BIOTENE) solution 15 mL  15 mL  Mouth Rinse  QID  Mcarthur Rossetti. Feinstein   15 mL at 06/07/11 1200    .  DISCONTD: aspirin EC tablet 81 mg  81 mg  Oral  QAM  Rakesh V. Vassie Loll, MD   81 mg at 06/04/11 0929    .  DISCONTD: benzonatate (TESSALON) capsule 100 mg  100 mg  Oral  TID PRN  W Ashley Royalty., MD   100 mg at 05/31/11 0111    .  DISCONTD: budesonide-formoterol (SYMBICORT) 80-4.5 MCG/ACT inhaler 1 puff  1 puff  Inhalation  BID  W Ashley Royalty., MD   1 puff at 05/30/11 0746    .  DISCONTD:  cefTAZidime (FORTAZ) 1 g in dextrose 5 % 50 mL IVPB  1 g  Intravenous  Q8H  Daniel J. Feinstein   1 g at 06/04/11 0502    .  DISCONTD: cefTAZidime (FORTAZ) 1 g in dextrose 5 % 50 mL IVPB  1 g  Intravenous  Q24H  Marcelino Scot, PHARMD   1 g at 06/08/11 0603    .  DISCONTD: chlorhexidine (PERIDEX) 0.12 % solution 15 mL  15 mL  Mouth Rinse  BID  Mcarthur Rossetti. Feinstein   15 mL at 06/06/11 2009    .  DISCONTD: diltiazem (CARDIZEM CD) 24 hr capsule 180 mg  180 mg  Oral  Daily  W Ashley Royalty., MD   180 mg at 06/04/11 0930    .  DISCONTD: diltiazem (CARDIZEM CD) 24 hr capsule 180 mg  180 mg  Oral  Daily  Rakesh V. Vassie Loll, MD      .  DISCONTD: diltiazem (CARDIZEM) 10 mg/ml oral suspension 60 mg  60 mg  Oral  Q8H  Daniel J. Tyson Alias      .  DISCONTD: diltiazem (CARDIZEM) 10 mg/ml oral suspension 60 mg  60 mg  Per Tube  Q8H  Daniel J. Feinstein   60 mg at 06/06/11 2134    .  DISCONTD: doxazosin (CARDURA) tablet 4 mg  4 mg  Oral  QHS  W Ashley Royalty., MD   4 mg at 06/03/11 2111    .  DISCONTD: doxazosin (CARDURA) tablet 4 mg  4 mg  Oral  QHS  Rakesh V. Vassie Loll, MD      .  DISCONTD: doxycycline (VIBRA-TABS) tablet 100 mg  100 mg  Oral  Q12H  W Ashley Royalty., MD   100 mg at 05/30/11 1610    .  DISCONTD: enoxaparin (LOVENOX) injection 30 mg  30 mg  Subcutaneous  QHS  Darden Palmer., MD   30 mg at 05/31/11 0006    .  DISCONTD: fentaNYL (SUBLIMAZE) 10 mcg/mL in sodium chloride 0.9 % 250 mL infusion  50-300 mcg/hr  Intravenous  Titrated  Mcarthur Rossetti. Feinstein  5 mL/hr at 06/06/11 2000  50 mcg/hr at 06/06/11 2000    .  DISCONTD: fentaNYL (SUBLIMAZE) bolus via infusion 50-100 mcg  50-100 mcg  Intravenous  Q6H PRN  Mcarthur Rossetti. Tyson Alias      .  DISCONTD: fentaNYL (SUBLIMAZE) injection 25-50 mcg  25-50 mcg  Intravenous  Q2H PRN  Mcarthur Rossetti. Tyson Alias      .  DISCONTD: ferrous sulfate tablet 325 mg  325 mg  Oral  BID WC  Rakesh V. Vassie Loll, MD   325 mg at 06/04/11 0929    .  DISCONTD: furosemide (LASIX) injection  40 mg  40 mg  Intramuscular  Q8H  W Ashley Royalty., MD      .  DISCONTD: furosemide (LASIX) injection 40 mg  40 mg  Intravenous  BID  Dannielle Karvonen [place], PHARMD   40 mg at 05/30/11 0857    .  DISCONTD: lidocaine (XYLOCAINE) 1 % (with pres) injection    PRN  Rakesh V. Vassie Loll, MD   6 mL at 06/01/11 0820    .  DISCONTD: lidocaine (XYLOCAINE) 2 % (with pres) injection    PRN  Rakesh V. Vassie Loll, MD   10 mL at 06/01/11 0820    .  DISCONTD: montelukast (SINGULAIR) tablet 10 mg  10 mg  Oral  QHS  W Ashley Royalty., MD   10 mg at 06/03/11 2110    .  DISCONTD: montelukast (SINGULAIR) tablet 10 mg  10 mg  Oral  QHS  Rakesh V. Vassie Loll, MD      .  DISCONTD: moxifloxacin (AVELOX) IVPB 400 mg  400 mg  Intravenous  Q24H  Danford Bad, NP   400 mg at 06/02/11 1548    .  DISCONTD: multivitamin liquid 5 mL  5 mL  Per Tube  Daily  Dannielle Huh, PHARMD   5 mL at 06/06/11 1012    .  DISCONTD: multivitamins ther. w/minerals tablet 1 tablet  1 tablet  Oral  Daily  Rakesh V. Vassie Loll, MD   1 tablet at 06/04/11 1000    .  DISCONTD: multivitamins ther. w/minerals tablet 1 tablet  1 tablet  Oral  Daily  Billy Fischer, MD   1 tablet at 06/09/11 1000    .  DISCONTD: pantoprazole (PROTONIX) EC tablet 40 mg  40 mg  Oral  Q1200  W Ashley Royalty., MD   40 mg at 06/03/11 1244    .  DISCONTD: pantoprazole (PROTONIX) EC tablet 40 mg  40 mg  Oral  Q1200  Rakesh V. Vassie Loll, MD      .  DISCONTD: pantoprazole (PROTONIX) EC tablet 40 mg  40 mg  Oral  QAC lunch  Billy Fischer, MD   40 mg at 06/09/11 1257    .  DISCONTD: pantoprazole sodium (PROTONIX) 40 mg/20 mL oral suspension 40 mg  40 mg  Per Tube  Q1200  Dannielle Huh, PHARMD   40 mg at 06/06/11 1152    .  DISCONTD: pravastatin (PRAVACHOL) tablet 20 mg  20 mg  Oral  q1800  W Ashley Royalty., MD   20 mg at 06/03/11 1819    .  DISCONTD: pravastatin (PRAVACHOL) tablet 20 mg  20 mg  Per Tube  [id number]  Mcarthur Rossetti. Feinstein   20 mg at 06/08/11 1726    .  DISCONTD:  simvastatin (ZOCOR) tablet 5 mg  5 mg  Oral  q1800  Rakesh V. Vassie Loll, MD      .  DISCONTD: traMADol Janean Sark) tablet 50 mg  50 mg  Oral  Q6H PRN  W Ashley Royalty., MD   50 mg at 05/31/11 1124    .  DISCONTD: traMADol (ULTRAM) tablet 50 mg  50 mg  Oral  Q6H PRN  Rakesh V. Vassie Loll, MD      .  DISCONTD: traMADol Janean Sark) tablet 50 mg  50 mg  Oral  Q6H PRN  Kalman Shan, MD   50 mg at 06/03/11 2043    .  DISCONTD: vancomycin (VANCOCIN) IVPB 1000 mg/200 mL premix  1,000 mg  Intravenous  Q12H  Mcarthur Rossetti. Feinstein   1,000 mg at 06/04/11 0249    Medications Prior to Admission    Medication  Sig  Dispense  Refill    .  amiodarone (PACERONE) 200 MG tablet  Take 200 mg by mouth 3 (three) times a week. Take one tablet on Monday, Wednesday, and Friday      .  aspirin EC 81 MG tablet  Take 81 mg by mouth every morning.      .  budesonide-formoterol (SYMBICORT) 80-4.5 MCG/ACT inhaler  Inhale 2 puffs into the lungs 2 (two) times daily as needed. For shortness of breath      .  bumetanide (BUMEX) 2 MG tablet  Take 2 mg by mouth daily.      .  cholecalciferol (VITAMIN D) 1000 UNITS tablet  Take 1,000 Units by mouth daily.      Marland Kitchen  diltiazem (CARDIZEM CD) 180 MG 24 hr capsule  Take 180 mg by mouth daily.      Marland Kitchen  doxazosin (CARDURA) 4 MG tablet  Take 4 mg by mouth at bedtime.      .  ferrous sulfate 325 (65 FE) MG tablet  Take 325 mg by mouth 2 (two) times daily with a meal.      .  Multiple Vitamins-Minerals (MULTIVITAMINS THER. W/MINERALS) TABS  Take 1 tablet by mouth daily.      Marland Kitchen  omeprazole (PRILOSEC) 20 MG capsule  Take 20 mg by mouth daily.      .  pravastatin (PRAVACHOL) 20 MG tablet  Take 20 mg by mouth every evening.      .  montelukast (SINGULAIR) 10 MG tablet  Take 10 mg by mouth daily as needed. For shortness of breath      .  traMADol (ULTRAM) 50 MG tablet  Take 50 mg by mouth every 6 (six) hours as needed. For muscle spasms. Maximum dose= 8 tablets per day       Current Medications: Current  facility-administered medications:0.9 % sodium chloride infusion, , Intravenous, Continuous, W Ashley Royalty., MD, Last Rate: 5 mL/hr at 06/06/11 0900; albuterol (PROVENTIL) (5 MG/ML) 0.5% nebulizer solution 2.5 mg, 2.5 mg, Nebulization, QID, Billy Fischer, MD; amiodarone (PACERONE) tablet 200 mg, 200 mg, Oral, Q M,W,F, Billy Fischer, MD  antiseptic oral rinse (BIOTENE) solution 15 mL, 15 mL, Mouth Rinse, BID, Billy Fischer, MD, 15 mL at 06/09/11 0800; aspirin chewable tablet 81 mg, 81 mg, Per Tube, Daily, Mcarthur Rossetti. Tyson Alias, 81 mg at 06/09/11 1000; cholecalciferol (VITAMIN D) tablet 1,000 Units, 1,000 Units, Oral, Daily, Rakesh V. Vassie Loll, MD, 1,000 Units at 06/09/11 1258; colchicine tablet 0.6 mg, 0.6  mg, Oral, Daily PRN, Lanora Manis Deterding, 0.6 mg at 06/09/11 0149  doxazosin (CARDURA) tablet 4 mg, 4 mg, Per Tube, QHS, Mcarthur Rossetti. Tyson Alias, 4 mg at 06/08/11 2103; ferrous sulfate 300 (60 FE) MG/5ML syrup 300 mg, 300 mg, Per Tube, BID WC, Mcarthur Rossetti. Feinstein, 300 mg at 06/09/11 1138; heparin injection 5,000 Units, 5,000 Units, Subcutaneous, Q8H, Rakesh V. Vassie Loll, MD, 5,000 Units at 06/09/11 1431; ondansetron [facility]) injection 4 mg, 4 mg, Intravenous, Q8H PRN, Sandrea Hughs, MD, 4 mg at 06/06/11 2334  sodium chloride 0.9 % injection 3 mL, 3 mL, Intravenous, Q12H, Billy Fischer, MD, 3 mL at 06/09/11 1000; DISCONTD: albuterol (PROVENTIL) (5 MG/ML) 0.5% nebulizer solution 2.5 mg, 2.5 mg, Nebulization, Q4H, Anders Simmonds, NP, 2.5 mg at 06/08/11 1640; DISCONTD: albuterol (PROVENTIL) (5 MG/ML) 0.5% nebulizer solution 2.5 mg, 2.5 mg, Nebulization, Q6H, Billy Fischer, MD, 2.5 mg at 06/09/11 0830  DISCONTD: amiodarone (PACERONE) tablet 200 mg, 200 mg, Per Tube, Q M,W,F, Mcarthur Rossetti. Feinstein, 200 mg at 06/09/11 1257; DISCONTD: cefTAZidime (FORTAZ) 1 g in dextrose 5 % 50 mL IVPB, 1 g, Intravenous, Q24H, Marcelino Scot, PHARMD, 1 g at 06/08/11 0603  DISCONTD: fentaNYL (SUBLIMAZE) 10 mcg/mL in sodium chloride 0.9 % 250 mL  infusion, 50-300 mcg/hr, Intravenous, Titrated, Mcarthur Rossetti. Tyson Alias, Last Rate: 5 mL/hr at 06/06/11 2000, 50 mcg/hr at 06/06/11 2000; DISCONTD: fentaNYL (SUBLIMAZE) bolus via infusion 50-100 mcg, 50-100 mcg, Intravenous, Q6H PRN, Mcarthur Rossetti. Tyson Alias; DISCONTD: fentaNYL (SUBLIMAZE) injection 25-50 mcg, 25-50 mcg, Intravenous, Q2H PRN, Mcarthur Rossetti. Tyson Alias  DISCONTD: multivitamins ther. w/minerals tablet 1 tablet, 1 tablet, Oral, Daily, Billy Fischer, MD, 1 tablet at 06/09/11 1000; DISCONTD: pantoprazole (PROTONIX) EC tablet 40 mg, 40 mg, Oral, QAC lunch, Billy Fischer, MD, 40 mg at 06/09/11 1257; DISCONTD: pravastatin (PRAVACHOL) tablet 20 mg, 20 mg, Per Tube, q1800, Mcarthur Rossetti. Feinstein, 20 mg at 06/08/11 1726  Precautions/Special Needs:  Additional Precautions/Restrictions:  Precautions  Precautions: Fall  Required Braces or Orthoses: No  Restrictions  Weight Bearing Restrictions: No  Therapy Assessments Current Functional Level:  Cognition  Arousal/Alertness: Awake/alert  Overall Cognitive Status: Appears within functional limits for tasks assessed  Orientation Level: Oriented X4  Cognition  Arousal/Alertness: Awake/alert  Orientation Level: Oriented X4  PTA:  Home Living  Lives With: Daughter (2 grandsons)  Receives Help From: Family  Type of Home: House  Home Layout: One level  Home Access: Stairs to enter  Entrance Stairs-Rails: None  Secretary/administrator of Steps: 1  Bathroom Shower/Tub: Medical sales representative: Standard  Home Adaptive Equipment: Straight cane;Bedside commode/3-in-1;Walker - rolling  Additional Comments: was using only the [facility] before admission  Prior Function  Level of Independence: Independent with basic ADLs;Independent with transfers;Requires assistive device for independence  Able to Take Stairs?: No                               Blood pressure 165/79, pulse 83, temperature 98 F (36.7 C), temperature source Oral, resp. rate 17,  height 5\' 3"  (1.6 m), weight 68.04 kg (150 lb), SpO2 93.00%. Physical Exam  Constitutional: She appears anxious.  HENT:  Head: Normocephalic.  Eyes: Pupils are equal, round, and reactive to light.  Neck: Neck supple. No thyromegaly present.  Cardiovascular: Regular rhythm.  Pulmonary/Chest: She has wheezes. She exhibits no tenderness.  Abdominal: She exhibits no distension. There is no tenderness.  Musculoskeletal: She exhibits no edema.  Neurological: She is alert. No sensory deficit.Left  side neglect.Speech is aphasic.  Patient easily fatigued with exam. Generalized weakness with proximal weakness more significant than distal. Proximally she grossly 3/5 for distally she is 4/5.  Psychiatric: Thought content normal   Results for orders placed during the hospital encounter of 05/28/11 (from the past 48 hour(s))  BASIC METABOLIC PANEL     Status: Abnormal   Collection Time   06/09/11  5:54 AM      Component Value Range Comment   Sodium 146 (*) 135 - 145 (mEq/L)    Potassium 3.8  3.5 - 5.1 (mEq/L)    Chloride 102  96 - 112 (mEq/L)    CO2 34 (*) 19 - 32 (mEq/L)    Glucose, Bld 107 (*) 70 - 99 (mg/dL)    BUN 54 (*) 6 - 23 (mg/dL)    Creatinine, Ser 4.09 (*) 0.50 - 1.10 (mg/dL)    Calcium 9.5  8.4 - 10.5 (mg/dL)    GFR calc non Af Amer 19 (*) >90 (mL/min)    GFR calc Af Amer 22 (*) >90 (mL/min)   CBC     Status: Abnormal   Collection Time   06/09/11  5:54 AM      Component Value Range Comment   WBC 8.8  4.0 - 10.5 (K/uL)    RBC 3.18 (*) 3.87 - 5.11 (MIL/uL)    Hemoglobin 9.6 (*) 12.0 - 15.0 (g/dL)    HCT 81.1 (*) 91.4 - 46.0 (%)    MCV 96.9  78.0 - 100.0 (fL)    MCH 30.2  26.0 - 34.0 (pg)    MCHC 31.2  30.0 - 36.0 (g/dL)    RDW 78.2  95.6 - 21.3 (%)    Platelets 269  150 - 400 (K/uL)    Dg Chest 2 View  06/09/2011  *RADIOLOGY REPORT*  Clinical Data: Cough, history hypertension and asthma  CHEST - 2 VIEW  Comparison: Portable chest x-ray of 06/07/2011  Findings: Aeration has  improved slightly.  There also has been some improvement in airspace disease although patchy opacities remain in the right upper lung field and medially at the left lung base. Cardiomegaly is stable.  No bony abnormality is seen.  IMPRESSION: Improved aeration with some improvement in patchy airspace disease.  Original Report Authenticated By: Juline Patch, M.D.    Post Admission Physician Evaluation: 1. Functional deficits secondary  to acute respiratory failure due to pneumonia and congestive heart failure.. 2. Patient is admitted to receive collaborative, interdisciplinary care between the physiatrist, rehab nursing staff, and therapy team. 3. Patient's level of medical complexity and substantial therapy needs in context of that medical necessity cannot be provided at a lesser intensity of care such as a SNF. 4. Patient has experienced substantial functional loss from his/her baseline which was documented above under the "Functional History" and "Functional Status" headings.  Judging by the patient's diagnosis, physical exam, and functional history, the patient has potential for functional progress which will result in measurable gains while on inpatient rehab.  These gains will be of substantial and practical use upon discharge  in facilitating mobility and self-care at the household level. 5. Physiatrist will provide 24 hour management of medical needs as well as oversight of the therapy plan/treatment and provide guidance as appropriate regarding the interaction of the two. 6. 24 hour rehab nursing will assist with bladder management, bowel management, safety, skin/wound care, disease management, medication administration, pain management and patient education  and help integrate therapy concepts, techniques,education, etc. 7. PT  will assess and treat for: balance, endurance, locomotion, strength and transferring. Goals are: supervision. 8. OT will assess and treat for: bathing, dressing, feeding,  grooming and toileting .  Goals are: supervision.  9. SLP will assess and treat for: 1.  Goals are: N/A. 10. Case Management and Social Worker will assess and treat for psychological issues and discharge planning. 11. Team conference will be held weekly to assess progress toward goals and to determine barriers to discharge. 12.  Patient will receive at least 3 hours of therapy per day at least 5 days per week. 13. ELOS and Prognosis: 10days good   Medical Problem List and Plan:  1.Deconditioned with respiritory failure/pneumonia. 2.DVTprophylaxis-S.Q.heparin 3.CRI-STRICT I&O. Follow up labs 4.Asthma-nebs as advised.check oxygen sats 5.Htn/a-fib. Amiodarone,cardura.Monitor with increased activity.  6.GERD-protonix 7.hyperlipidemia-pravachol 8.mood-ego support 9.Chronic anemia-follow up cbc    KIRSTEINS,ANDREW E 06/10/2011, 5:09 PM

## 2011-06-10 NOTE — Progress Notes (Signed)
I am admitting patient to inpatient rehabilitation today. Please call with any questions. [phone].

## 2011-06-10 NOTE — Progress Notes (Addendum)
Subjective:  Feeling better and going to rehab today.  Hopes to get home for Thanksgiving. She continues to cough but states that her breathing is better. She is not short of breath and has a good appetite. She denies any chest pain. Objective:  Vital Signs in the last 24 hours: Temp:  [97.8 F (36.6 C)-98.3 F (36.8 C)] 98.3 F (36.8 C) (11/15 0411) Pulse Rate:  [72-88] 82  (11/15 0411) Cardiac Rhythm:  [-]  Resp:  [18-19] 18  (11/15 0411) BP: (155-177)/(76-84) 161/84 mmHg (11/15 0411) SpO2:  [90 %-99 %] 90 % (11/15 0809) Weight:  [67.405 kg (148 lb 9.6 oz)] 148 lb 9.6 oz (67.405 kg) (11/15 0411) 1.  Physical Exam: BP Readings from Last 1 Encounters:  06/10/11 161/84    Wt Readings from Last 1 Encounters:  06/10/11 67.405 kg (148 lb 9.6 oz)    Weight change: -1.495 kg (-3 lb 4.8 oz)   Elderly white female who is in no acute distress, her respirations are unlabored and she is sittingup  On the bed. She is calm and in no respiratory distress. Lungs: Rales at the left base. Cardiac: Normal S1-S2, occasional early beat. 2/6 systolic murmur radiating from the aortic area up toward the neck. Abdomen: Soft nontender. No edema present.  Intake/Output from previous day: 11/14 0701 - 11/15 0700 In: -  Out: 450 [Urine:450]  Lab Results: BMET    Component Value Date/Time   NA 146* 06/09/2011 0554   K 3.8 06/09/2011 0554   CL 102 06/09/2011 0554   CO2 34* 06/09/2011 0554   GLUCOSE 107* 06/09/2011 0554   BUN 54* 06/09/2011 0554   CREATININE 2.17* 06/09/2011 0554   CALCIUM 9.5 06/09/2011 0554   GFRNONAA 19* 06/09/2011 0554   GFRAA 22* 06/09/2011 0554   CBC    Component Value Date/Time   WBC 8.8 06/09/2011 0554   RBC 3.18* 06/09/2011 0554   HGB 9.6* 06/09/2011 0554   HCT 30.8* 06/09/2011 0554   PLT 269 06/09/2011 0554   MCV 96.9 06/09/2011 0554   MCH 30.2 06/09/2011 0554   MCHC 31.2 06/09/2011 0554   RDW 14.0 06/09/2011 0554   LYMPHSABS 1.3 06/04/2011 0550   MONOABS 0.9  06/04/2011 0550   EOSABS 0.5 06/04/2011 0550   BASOSABS 0.1 06/04/2011 0550   Assessment/Plan:  1. Pneumonia with respiratory failure  Assessment:  Much improved. 2. Acute on chronic renal failure likely due to contrast  Assessment: Appears to have peaked and appears to be in the resolving phase. She is diuresing well at this time and the creatinine continues to fall. 3. Atrial fibrillation Plan:continue amio. 3. Acute on chronic diastolic heart failure. Assessment:resolved.  Greatly appreciate pulmonary help.  I will see in my office post rehab.   LOS: 13 days    W. Ashley Royalty.  MD [facility] 06/10/2011, 1:45 PM

## 2011-06-10 NOTE — Plan of Care (Signed)
Overall Plan of Care [facility]) Patient Details Name: [name] MRN: [id number] DOB: Feb 07, 1922  Diagnosis: Deconditioning/Respiratory failure   Primary Diagnosis:    Physical deconditioning Co-morbidities: dysphagia, gout, asthma, hx of PE, Afib  Functional Problem List  Patient demonstrates impairments in the following areas: Balance, Endurance, Pain, Safety, Skin Integrity and strength  Swallowing    Basic ADL's: grooming, bathing, dressing, toileting and showering Advanced ADL's: none  Transfers:  bed mobility, bed to chair, toilet, tub/shower, car and furniture Locomotion:  ambulation, wheelchair mobility and stairs  Additional Impairments:  Swallowing  Anticipated Outcomes Item Anticipated Outcome  Eating/Swallowing  Modified independent   Basic self-care  Modified independent  Tolieting  Modified independent  Bowel/Bladder  Modified Independent  Transfers  Modified independent, S car  Locomotion  Modified independent gait, S stairs  Communication    Cognition    Pain  < than 3  Safety/Judgment  Supervision with cane  Other     Therapy Plan:  1-2 x day PT  1-2 x day OT   1-2x day ST  Team Interventions: Item RN PT OT SLP SW TR Other  Self Care/Advanced ADL Retraining   x      Neuromuscular Re-Education  x x      Therapeutic Activities  x x      UE/LE Strength Training/ROM  x x      UE/LE Coordination Activities  x x      Visual/Perceptual Remediation/Compensation         DME/Adaptive Equipment Instruction  x x      Therapeutic Exercise  x x      Balance/Vestibular Training  x x      Patient/Family Education  x x x     Cognitive Remediation/Compensation         Functional Mobility Training  x x      Ambulation/Gait Training  x       Museum/gallery curator  x       Wheelchair Propulsion/Positioning  x       Functional Tourist information centre manager Reintegration  x       Dysphagia/Aspiration Office manager         Bladder Management         Bowel Management         Disease Management/Prevention         Pain Management  x       Medication Management         Skin Care/Wound Management  x       Splinting/Orthotics         Discharge Planning     x    Psychosocial Support     x                       Team Discharge Planning: Destination:  Home Projected Follow-up:  PT, OT and Home Health Projected Equipment Needs:  Walker Patient/family involved in discharge planning:  Yes  MD ELOS: 5-7 days Medical Rehab Prognosis:  Excellent Assessment: Patient is admitted for CIR therapies.  PT will see the patient at least 1-2 hours per day at least 5 days per week addressing LE strength, balance, mobility, adaptive equipment training with MOD Independent goals.  OT will see the patient at least 1-2 hours per day at least 5 days per week addressing self-care, fxnl mobility, upper  extremity strength, and safety with MOD Independent goals.  SLP is seeing the patient about an hour per day working on swallowing with MOD I goals.  Patient is motivated and making progress toward goals.

## 2011-06-10 NOTE — Plan of Care (Signed)
Problem: Discharge Progression Outcomes Goal: Other Discharge Outcomes/Goals Outcome: Completed/Met Date Met:  06/10/11 Pt discharged to inpatient rehab

## 2011-06-10 NOTE — Plan of Care (Signed)
Problem: Consults Goal: RH GENERAL PATIENT EDUCATION See Patient Education module for education specifics.  Outcome: Progressing Pt is being educated on Rehab process and is eager to participate. Goal is for Pt to be at Mod Independence for education  Problem: RH SKIN INTEGRITY Goal: RH STG SKIN FREE OF INFECTION/BREAKDOWN Outcome: Progressing Pt should be Modified Independence for self skin care by discharge. Pt will be free of breakdown and skin infections at discharge  Problem: RH SAFETY Goal: RH STG ADHERE TO SAFETY PRECAUTIONS W/ASSISTANCE/DEVICE Outcome: Progressing Pt ambulates with single point cane with supervision of staff

## 2011-06-10 NOTE — Progress Notes (Signed)
This patient was discussed at long LOS rounds 06/09/11.  

## 2011-06-10 NOTE — Progress Notes (Signed)
OT consult received and appreciated. Discussed D/C plan to CIR with admission RN and pt. being accepted to CIR today. Will defer further OT therapy needs to CIR. Will sign off acutely and pt. Agreeable to this. Thanks! Cassandria Anger, OTR/L Pager: [phone] 06/10/2011 .

## 2011-06-10 NOTE — Discharge Summary (Signed)
Physician Discharge Summary  Patient ID: [name] MRN: [id number] DOB/AGE: 11-25-1921 77 y.o.  Admit date: 05/28/2011 Discharge date: 06/10/2011    Discharge Diagnoses:  Principal Problem:  *Respiratory failure, acute Active Problems:  Atrial fibrillation  Pneumonia  Hypertension  Acute on chronic diastolic congestive heart failure      Brief Summary: [name] is a 77 y.o. y/o female with a PMH of diastolic CHF, history of PE previously on Coumadin but discontinued recently related to GI bleeding. She presented on  05/28/2011 with complaints of 2 weeks of cough and wheezing and primary care physician sent her to the ER where she was found to have O2 sats of 80%. She was initially admitted by cardiology with CHF versus bronchitis. Chest x-ray revealed retrocardiac infiltrate and CT of the chest was negative for PE, but showed left lower lobe atelectasis and pulmonary with consult at that time. Patient ultimately required intubation for acute respiratory failure and was intubated from November 9 through November 12 when she self extubated. Course has been complicated by atelectasis and collapse, acute on chronic renal failure and severe deconditioning. At the time of discharge patient's acute pressure at failure and acute on chronic renal failure have resolved and she is being discharged to [facility] inpatient rehabilitation for further physical therapy.   Hospital Course by Discharge Diagnosis    1.  Respiratory failure, acute -- patient developed acute respiratory failure which is multifactorial in the setting of pneumonia, atelectasis and collapse, CHF, deconditioning, volume overload and renal failure. The patient required intubation on 11/ 9 and self extubated on 11/12. Please see specific problems for further discussion. At time of discharge patient's respiratory status is near her baseline. She is off all antibiotics. Discussions were had with the family at the  time of self extubation and family didn't wish to continue aggressive care however did not want patient to be reintubated patient is now a DO NOT INTUBATE.  2. Bronchitis -- patient was initially thought to have an acute bronchitis and was treated with IV antibiotics as well as IV steroids initially. CT scan showed left lower lobe atelectasis and likely pneumonia on CT scan. Acute bronchitis is resolving at time of discharge.  3. Atelectasis --patient did have recurrent atelectasis and collapse of the left lower lobe. Likely secondary to mucous plugging in the setting of pneumonia. The patient did require bronchoscopy on November 9 with Dr. Tyson Alias for removal of secretions. Her stamina to complete collapse of the left main, thick pus, and cultures and cytology she obtained from bronchoscopy have remained negative.  4 Pneumonia -- left lower lobe community-acquired pneumonia no organism specified. Patient was treated with IV antibiotics including vancomycin and Ceptaz. At the time of discharge patient is afebrile, white count is normal, she is off all antibiotics at this time. Followup chest x-rays have been clear and left lower lobe atelectasis and collapse is resolved.   5. Acute on chronic renal failure -- patient has stage 3-4 chronic kidney disease at baseline. Patient did have acute on chronic renal failure during this admission likely attributed to contrast-induced nephropathy. Patient was seen in consultation by renal. She was aggressively hydrated and chemistries were followed closely. Renal has signed off and no further implications for renal followup necessary at this time.  6.  Mitral regurgitation -- patient was followed closely by her cardiologist Dr. Viann Fish. Echocardiogram was done during admission. Patient will continue followup for this chronic problem as an outpatient.  7. Afib --  patient has a history of a set of as well as PE and was recently taken off of anticoagulation in  the setting of GI bleed. She's remained in sinus rhythm during this admission. She remains on her amiodarone by mouth per Dr. Donnie Aho  8. HTN -- again this is a chronic problem for this patient she is followed closely by Dr. Eli Phillips telemetry. She remains on her Cardura and amiodarone. Her Cardizem has been held at this time. Further titration of her medications will be done by [facility] rehabilitation as well as Dr. Donnie Aho as an outpatient.  9. Acute on chronic diastolic heart failure--patient was diuresed to a negative I&O. She tolerated this well. Home Bumex has been held secondary to worsening renal failure and intermittent hypotension.   Consults: cardiology and nephrology   Significant Diagnostic Studies:  Dg Chest 2 View  06/09/2011  *RADIOLOGY REPORT*  Clinical Data: Cough, history hypertension and asthma  CHEST - 2 VIEW  Comparison: Portable chest x-ray of 06/07/2011  Findings: Aeration has improved slightly.  There also has been some improvement in airspace disease although patchy opacities remain in the right upper lung field and medially at the left lung base. Cardiomegaly is stable.  No bony abnormality is seen.  IMPRESSION: Improved aeration with some improvement in patchy airspace disease.  Original Report Authenticated By: Juline Patch, M.D.    Ct Angio Chest W/cm &/or Wo Cm  05/29/2011  *RADIOLOGY REPORT*  Clinical Data:  Shortness of breath.  Pulmonary hypertension, CHF.  CT ANGIOGRAPHY CHEST WITH CONTRAST  Technique:  Multidetector CT imaging of the chest was performed using the standard protocol during bolus administration of intravenous contrast.  Multiplanar CT image reconstructions including MIPs were obtained to evaluate the vascular anatomy.  Contrast: 60mL OMNIPAQUE IOHEXOL 350 MG/ML IV SOLN chest x-ray 05/28/2011.  Chest CT 07/19/2008  Comparison:   Site of service.  Findings:  No filling defects in the pulmonary arteries to suggest pulmonary emboli.  There is  consolidation throughout much of the left lower lobe and lingula.  The left main stem bronchus and left lower lobe bronchi are filled with fluid/debris.  Cannot exclude mucus plugging.  There is cardiomegaly.  Dense coronary artery and aortic calcifications.  No aneurysm.  Mildly prominent mediastinal lymph nodes.  No axillary adenopathy. Mildly prominent right hilar lymph nodes.  Difficult to evaluate left hilum due to collapsed lung in the region.  There is a small hiatal hernia. Imaging into the upper abdomen shows no acute findings.  Review of the MIP images confirms the above findings.  IMPRESSION: Fluid/debris fills the left main stem bronchus and lower lobe bronchi.  Associated airspace disease throughout much the left lung which could represent pneumonia, collapse, or drowned lung.  Cannot exclude mucous plug.  Small left pleural effusion.  No evidence of pulmonary embolus.  Cardiomegaly, coronary artery disease.  Mild mediastinal and right hilar adenopathy, presumably reactive.  Original Report Authenticated By: Cyndie Chime, M.D.   US Renal  06/02/2011  *RADIOLOGY REPORT*  Clinical Data: Renal failure.  RENAL/URINARY TRACT ULTRASOUND COMPLETE  Comparison:  None.  Findings:  Right Kidney:  Measures 10.5 cm.  There is a 1.7 cm anechoic cyst. The kidney is echogenic.  No hydronephrosis.  Left Kidney:  Increased echogenicity without focal lesion.  The kidney measures 9.9 cm.  Bladder:  Decompressed with a Foley catheter balloon in place.  Incidental note is made of a small left pleural effusion.  IMPRESSION: Echogenic kidneys in  keeping with medical renal disease.  No hydronephrosis.  1.7 cm right renal cyst.  Original Report Authenticated By: Waneta Martins, M.D.    Dg Chest Portable 1 View  05/31/2011  *RADIOLOGY REPORT*  Clinical Data: Cough, chest congestion, shortness of breath  PORTABLE CHEST - 1 VIEW  Comparison: Chest x-ray of 05/28/2011 and CT angio chest of 05/29/2011  Findings: There is  now complete opacification of the left hemithorax, most likely due to atelectasis with no evidence of mediastinal shift to the right.  There does appear to be cut off of the left mainstem bronchus which may be due to mucous plugging although in view of the occlusion of the left mainstem bronchus on recent CT, mass cannot be excluded.  The right lung is clear. Heart size is difficult to assess.  IMPRESSION: Complete opacification of the left hemithorax probably due to mucous plugging and/or left central endobronchial lesion.  Original Report Authenticated By: Juline Patch, M.D.     Discharge Exam: General appearance: NAD, conversant, eating breakfast Eyes: anicteric sclerae, moist conjunctivae; no lid-lag; PERRLA HENT: Atraumatic; oropharynx clear with moist mucous membranes and no mucosal ulcerations; normal hard and soft palate Neck: Trachea midline; FROM, supple, no thyromegaly or lymphadenopathy Lungs: diminished bases, otherwise CTA with normal respiratory effort and no intercostal retractions CV: RRR, no MRGs  Abdomen: Soft, non-tender; no masses or HSM Extremities: No peripheral edema or extremity lymphadenopathy Skin: Normal temperature, turgor and texture; no rash, ulcers or subcutaneous nodules Psych: Appropriate affect, alert and oriented to person, place and time    Disposition:  [facility]   Current Discharge Medication List     . albuterol  2.5 mg Nebulization QID  . amiodarone  200 mg Oral Q M,W,F  . antiseptic oral rinse  15 mL Mouth Rinse BID  . aspirin  81 mg Oral Daily  . cholecalciferol  1,000 Units Oral Daily  . doxazosin  4 mg Oral QHS  . ferrous sulfate  325 mg Oral BID WC  . heparin subcutaneous  5,000 Units Subcutaneous Q8H  . sodium chloride  3 mL Intravenous Q12H     Follow-up Information    Follow up with WHITE,CYNTHIA S on 06/28/2011. (930am)    Contact information:   [address] [employer] [place] [address] [phone]       Follow up with Darden Palmer, MD on 06/29/2011. (1130am)    Contact information:   [address] [place] [address] [phone]           Discharged Condition: [name] has met maximum benefit of inpatient care and is medically stable and cleared for discharge.  Patient is pending follow up as above.      Time spent on disposition:   Signed: Danford Bad 06/10/2011 9:22 AM   *Care during the described time interval was provided by me and/or other providers on the critical care team. I have reviewed this patient's available data, including medical history, events of note, physical examination and test results as part of my evaluation.

## 2011-06-10 NOTE — Progress Notes (Signed)
  Subjective/Complaints: Did very well last night. Denies shortness of breath or cough. No pain.  ROS negative  Objective: Vital Signs: Blood pressure 165/79, pulse 83, temperature 98 F (36.7 C), temperature source Oral, resp. rate 17, height 5\' 3"  (1.6 m), weight 68.04 kg (150 lb), SpO2 93.00%. Dg Chest 2 View  06/09/2011  *RADIOLOGY REPORT*  Clinical Data: Cough, history hypertension and asthma  CHEST - 2 VIEW  Comparison: Portable chest x-ray of 06/07/2011  Findings: Aeration has improved slightly.  There also has been some improvement in airspace disease although patchy opacities remain in the right upper lung field and medially at the left lung base. Cardiomegaly is stable.  No bony abnormality is seen.  IMPRESSION: Improved aeration with some improvement in patchy airspace disease.  Original Report Authenticated By: Juline Patch, M.D.    Basename 06/09/11 0554  WBC 8.8  HGB 9.6*  HCT 30.8*  PLT 269    Basename 06/09/11 0554  NA 146*  K 3.8  CL 102  CO2 34*  GLUCOSE 107*  BUN 54*  CREATININE 2.17*  CALCIUM 9.5   CBG (last 3)  No results found for this basename: GLUCAP:3 in the last 72 hours  Wt Readings from Last 3 Encounters:  06/10/11 68.04 kg (150 lb)  06/10/11 67.405 kg (148 lb 9.6 oz)  06/10/11 67.405 kg (148 lb 9.6 oz)    Physical Exam:  General appearance: alert Head: Normocephalic, without obvious abnormality, atraumatic Eyes: conjunctiva on right is red/irritated Ears: normal TM's and external ear canals both ears Nose: Nares normal. Septum midline. Mucosa normal. No drainage or sinus tenderness. Throat: lips, mucosa, and tongue normal; teeth and gums normal Neck: no adenopathy, no carotid bruit, no JVD, supple, symmetrical, trachea midline and thyroid not enlarged, symmetric, no tenderness/mass/nodules Back: negative, symmetric, no curvature. ROM normal. No CVA tenderness. Resp: rales bibasilar Cardio: regular rate and rhythm, S1, S2 normal, no murmur,  click, rub or gallop GI: soft, non-tender; bowel sounds normal; no masses,  no organomegaly Extremities: extremities normal, atraumatic, no cyanosis or edema Pulses: 2+ and symmetric Skin: Skin color, texture, turgor normal. No rashes or lesions Neurologic: Grossly normal Incision/Wound: not applicable    Assessment/Plan: 1. Functional deficits secondary to deconditioning from respiratory failiure which require 3+ hours per day of interdisciplinary therapy in a comprehensive inpatient rehab setting. Physiatrist is providing close team supervision and 24 hour management of active medical problems listed below. Physiatrist and rehab team continue to assess barriers to discharge/monitor patient progress toward functional and medical goals.  evals underway today  Mobility:         ADL:   Cognition:       Medical Problem List and Plan:  1 respiratory failure/pneumonia. Wean oxygen as able. IS q1-2 hours.  Albuterol nebs 2.DVTprophylaxis-S.Q.heparin and gait 3.CRI-STRICT I&O. Follow up labs pending 4.Asthma-nebs as advised.  Moving air well on exam. 5.Htn/a-fib. Amiodarone,cardura.Monitor with increased activity. Comfortable at rest.  6.GERD-protonix  7.hyperlipidemia-pravachol  8.mood-ego support. Pt in good spirits. 9.Chronic anemia-add iron supplement

## 2011-06-10 NOTE — Progress Notes (Signed)
PT ACCEPTED FOR ADMISSION TO [facility] TODAY

## 2011-06-11 DIAGNOSIS — R5381 Other malaise: Secondary | ICD-10-CM

## 2011-06-11 LAB — CBC
MCH: 29.9 pg (ref 26.0–34.0)
Platelets: 253 10*3/uL (ref 150–400)
RBC: 3.21 MIL/uL — ABNORMAL LOW (ref 3.87–5.11)

## 2011-06-11 LAB — DIFFERENTIAL
Basophils Absolute: 0.1 10*3/uL (ref 0.0–0.1)
Basophils Relative: 1 % (ref 0–1)
Eosinophils Relative: 6 % — ABNORMAL HIGH (ref 0–5)
Lymphocytes Relative: 14 % (ref 12–46)
Neutrophils Relative %: 70 % (ref 43–77)

## 2011-06-11 LAB — COMPREHENSIVE METABOLIC PANEL
Albumin: 2.5 g/dL — ABNORMAL LOW (ref 3.5–5.2)
Alkaline Phosphatase: 104 U/L (ref 39–117)
BUN: 35 mg/dL — ABNORMAL HIGH (ref 6–23)
Creatinine, Ser: 1.6 mg/dL — ABNORMAL HIGH (ref 0.50–1.10)
GFR calc Af Amer: 32 mL/min — ABNORMAL LOW (ref >90)
Glucose, Bld: 99 mg/dL (ref 70–99)
Total Protein: 6.1 g/dL (ref 6.0–8.3)

## 2011-06-11 MED ORDER — NYSTATIN 100000 UNIT/GM EX POWD
Freq: Two times a day (BID) | CUTANEOUS | Status: DC
  Administered 2011-06-11 – 2011-06-15 (×8): via TOPICAL
  Filled 2011-06-11 (×2): qty 15

## 2011-06-11 MED ORDER — BIOTENE DRY MOUTH MT LIQD
15.0000 mL | Freq: Two times a day (BID) | OROMUCOSAL | Status: DC
Start: 2011-06-11 — End: 2011-06-15
  Administered 2011-06-11 – 2011-06-15 (×9): 15 mL via OROMUCOSAL

## 2011-06-11 NOTE — Progress Notes (Signed)
Inpatient Rehabilitation Center Individual Statement of Services  Patient Name:  [name]  Date:  06/11/2011  Welcome to the Inpatient Rehabilitation Center.  Our goal is to provide you with an individualized program based on your diagnosis and situation, designed to meet your specific needs.  With this comprehensive rehabilitation program, you will be expected to participate in at least 3 hours of rehabilitation therapies Monday-Friday, with modified therapy programming on the weekends.  Your rehabilitation program will include the following services:  Physical Therapy (PT), Occupational Therapy (OT), Speech Therapy (ST), 24 hour per day rehabilitation nursing, Therapeutic Recreaction (TR), Case Management Banker and Child psychotherapist), Rehabilitation Medicine, Sports administrator.  Estimated length of stay:7 days  Overall predicted outcome: Modified independent   Weekly team conferences will be held on Tuesdays to discuss your progress.  Your RN Case Designer, television/film set will talk with you frequently to get your input and to update you on team discussions.  Team conferences with you and your family in attendance may also be held.  Depending on your progress and recovery, your program may change.  Your RN Case Estate agent will coordinate services and will keep you informed of any changes.  Your RN Sports coach and SW names and contact numbers are listed  below.  The following services may also be recommended but are not provided by the Inpatient Rehabilitation Center:   Driving Evaluations  Home Health Rehabiltiation Services  Outpatient Rehabilitatation [facility]  Vocational Rehabilitation   Arrangements will be made to provide these services after discharge if needed.  Arrangements include referral to agencies that provide these services.  Your insurance has been verified to be:  Medicare & BCBS  Your primary doctor is:  Dr. Laurann Montana @  [place]   Pertinent information will be shared with your doctor and your insurance company.  Case Manager: Melanee Spry, [facility] [phone]  Social Worker:  [place], [place] [phone]  Information discussed with and copy given to patient by: Meryl Dare, 06/11/2011, 3:35 PM

## 2011-06-11 NOTE — Progress Notes (Signed)
Occupational Therapy Session Note  Patient Details  Name: [name] MRN: [id number] Date of Birth: 12-08-1921  Today's Date: 06/11/2011 Time: 1-1:30 Time Calculation:  30 minutes  Precautions: Precautions Precautions:  (Pt on 3 liters oxygen) Restrictions Weight Bearing Restrictions: No  Short Term Goals: LTG=STG= moderate independence with Bathing, dressing toileting; supervision with shower transfer    Skilled Therapeutic Interventions/Progress Updates:  OT addressed functional mobility, transfer training to shower, and to toilet.  Pt. Was educated on walker safety, DME.  She returned demonstration with minimal instructional cues and minimal assist for dynamic balance.   Pain Pain Assessment Pain Assessment: No/denies pain Pain Score: 0-No pain ADL Toileting: Minimal assistance Toilet Transfer: Minimal assistance Acupuncturist: Programmer, applications Transfer: Minimal assistance;Minimal cueing Film/video editor Method: Designer, industrial/product: Transfer tub bench       Therapy/Group: Individual Therapy  Humberto Seals 06/11/2011, 2:35 PM

## 2011-06-11 NOTE — Progress Notes (Signed)
Physical Therapy Assessment and Plan & Treatment   Patient Details  Name: [name] MRN: [id number] Date of Birth: 13-Oct-1921  PT Diagnosis: Difficulty walking and Muscle weakness Rehab Potential: Good ELOS: 7 days   STG = LTG  Eval & Treatment [id number], individual therapy  Assessment & Plan Clinical Impression: [name] is an 77 y.o. female with a long complicated history of cardiac disease with congestive heart failure as well as chronic underlying lung disease. Admitted 05/28/2011 with cough x2-3 weeks with increased sputum production. Chest x-ray showed some venous hypertension but no edema. CT of the chest on 11/ 8 showed left-sided collapse with mucous plug. Patient had been placed on ventilator for respiratory support. Patient with self extubation on 06/07/2011. Chest x-ray 11/14 with improved aeration.She was seen by speech and placed on a mechanical soft diet with thin liquids. Patient transferred to CIR on 06/10/2011 .  Patient's past medical history is significant for cardiac disease, CHF, and bilateral hip replacements.    Patient currently requires min assist with mobility secondary to muscle weakness, decreased cardiorespiratoy endurance and decreased oxygen support and decreased standing balance and decreased balance strategies.  Prior to hospitalization, patient was independent with cane with mobility and lived with Daughter in a 1 level House  Home access is 1 step to enter without rails   .  Patient will benefit from skilled PT intervention to maximize safe functional mobility, minimize fall risk and decrease caregiver burden for planned discharge home with 24 hour supervision.  Anticipate patient will benefit from follow up HH at discharge.  PT - End of Session Activity Tolerance: Tolerates 30+ min activity with multiple rests Endurance Deficit: Yes Endurance Deficit Description: generalized weakness and deconditioning PT Assessment Rehab Potential:  Good Barriers to Discharge: None PT Plan PT Frequency: 1-2 X/day, 60-90 minutes Estimated Length of Stay: 7 days PT Treatment/Interventions: Ambulation/gait training;Balance/vestibular training;Community reintegration;DME/adaptive equipment instruction;Functional mobility training;Neuromuscular re-education;Patient/family education;Pain management;Splinting/orthotics;Stair training;Therapeutic Activities;Therapeutic Exercise;UE/LE Strength taining/ROM;UE/LE Coordination activities;Wheelchair propulsion/positioning  Precautions/Restrictions  fall risk, O2 via [place] Vital Signs  O2 93% with gait and 3 L of O2 via [place] Pain  no c/o pain. Home Living/Prior Functioning Home Living Lives With: Daughter Receives Help From: Family Type of Home: House Home Layout: One level Home Adaptive Equipment: Walker - rolling;Straight cane 1 step into house without rails Vision/Perception    wears glasses Cognition Orientation Level: Oriented X4 Sensation Sensation Light Touch: Appears Intact Proprioception: Appears Intact Coordination Gross Motor Movements are Fluid and Coordinated: Yes Motor  Motor Motor: Within Functional Limits (generalized weakness)  Mobility Bed Mobility Bed Mobility: Yes Supine to Sit: 5: Supervision Sitting - Scoot to Edge of Bed: 5: Supervision Sit to Supine - Right: 5: Supervision Transfers Transfers: Yes Sit to Stand: 4: Min assist Sit to Stand Details: Tactile cues for posture Stand to Sit: 4: Min assist Stand to Sit Details (indicate cue type and reason): Verbal cues for technique (to control descent) Stand Pivot Transfers: 4: Min assist Locomotion  Ambulation Ambulation: Yes Ambulation/Gait Assistance: 4: Min assist Ambulation Distance (Feet): 75 Feet Assistive device: Rolling walker Ambulation/Gait Assistance Details: Other (comment) (HHA without AD, steady A for minimal LOB to L with turn) Stairs / Additional Locomotion Stairs: Yes Stairs Assistance: 4:  Min assist Stair Management Technique: Two rails;With walker Number of Stairs: 5  Ramp: 4: Min assist (with RW) Curb: 4: Min assist (with RW) Wheelchair Mobility Wheelchair Mobility: Yes (gait primary means of mobility) Wheelchair Assistance: 1: +1 Total assist Wheelchair  Parts Management: Needs assistance  Trunk/Postural Assessment  Cervical Assessment Cervical Assessment: Within Functional Limits (forward head) Thoracic Assessment Thoracic Assessment: Exceptions to [facility] (kyphotic posture, cueing for upright posture with moblity) Lumbar Assessment Lumbar Assessment: Within Functional Limits Postural Control Postural Control: Within Functional Limits Postural Limitations: flexed posture and rounded shoulders  Balance Balance Balance Assessed: Yes Dynamic Sitting Balance Dynamic Sitting - Level of Assistance: 5: Stand by assistance Dynamic Standing Balance Dynamic Standing - Level of Assistance: 4: Min assist Dynamic Standing - Balance Activities: [employer];Reaching for objects;Reaching for weighted objects;Reaching across midline;Forward lean/weight shifting;Lateral lean/weight shifting;Other (comment) (baseball game simulation without A) Extremity Assessment      RLE Assessment RLE Assessment: Within Functional Limits (strength grossly 3+/5) LLE Assessment LLE Assessment: Within Functional Limits LLE Strength LLE Overall Strength Comments: grossly 3+/5  Recommendations for other services: Other: none  Discharge Criteria: Patient will be discharged from PT if patient refuses treatment 3 consecutive times without medical reason, if treatment goals not met, if there is a change in medical status, if patient makes no progress towards goals or if patient is discharged from hospital.  The above assessment, treatment plan, treatment alternatives and goals were discussed and mutually agreed upon: by the patient.  Treatment initiated with emphasis on dynamic balance activities  without UE support and weighshifts to play baseball game and ball toss. Gait training without AD to increase challenge of balance with minA overall, LOB noted with turn requiring steady A to recover.   Karolee Stamps [facility] 06/11/2011, 10:10 AM

## 2011-06-11 NOTE — Progress Notes (Signed)
Social Work Assessment and Plan Assessment and Plan  Patient Name: [name]  Today's Date: 06/11/2011  Problem List:  Patient Active Problem List  Diagnoses  . STROKE  . Hypertensive heart disease without CHF  . History of pulmonary embolus (PE)  . Chronic venous insufficiency  . Gout  . Asthma  . History of GI diverticular bleed  . Atrial fibrillation  . Chronic kidney disease stage 3  . Pulmonary hypertension  . GERD   . Pneumonia  . Respiratory failure, acute  . Hypertension  . Acute on chronic diastolic congestive heart failure  . Physical deconditioning    Past Medical History:  Past Medical History  Diagnosis Date  . Acute on chronic diastolic congestive heart failure 05/28/2011  . Hypertensive heart disease without CHF 05/28/2011  . History of pulmonary embolus (PE) 05/28/2011    2006 following pelvic fracture  . Chronic venous insufficiency 05/28/2011  . Hyperlipidemia 05/28/2011  . Gout 05/28/2011  . Asthma 05/28/2011  . History of GI diverticular bleed 05/28/2011    June 2012  . Atrial fibrillation 05/28/2011  . Chronic kidney disease stage 3 05/28/2011  . Kidney stones   . Osteoporosis   . Pulmonary hypertension 05/28/2011  . GERD (gastroesophageal reflux disease) 05/28/2011  . Mitral regurgitation 05/31/2011  . Secondary hyperparathyroidism (of renal origin)   . Anemia of chronic disease   . History of CVA (cerebrovascular accident)     Past Surgical History:  Past Surgical History  Procedure Date  . Partial colectomy     1999 following colonoscopy complication  . Left hip replacement     2004, complicated with dislocation  . Right hip replacement     2005  . Hernia repair   . Bilateral cataract surgery     Discharge Planning  Discharge Planning Case Management Consult Needed:  (already following)  Social/Family/Support Systems    Employment Status Employment Status Employment Status: Retired Date Retired/Disabled/Unemployed:  "long time agoRetail buyer Issues: None Guardian/Conservator: None  Abuse/Neglect    Emotional Status Emotional Status Pt's affect, behavior adn adjustment status: Pleasant, oriented, elderly woman - denies any emotional distress.  No s/s of depression or anxiety, therefore, BDI screen deferred.  Will monitor Recent Psychosocial Issues: None Pyschiatric History: None  Patient/Family Perceptions, Expectations & Goals Pt/Family Perceptions, Expectations and Goals Pt/Family understanding of illness & functional limitations: Pt with basic understanding that she was admitted with pneumonia and is on CIR for strengthening.  Daughter with good understanding of medical issues as well Premorbid pt/family roles/activities: elderly woman living in "in-law" apt. at daughter's home and independent overall.  Daughter describes very sedentary lifestyle for patient - one she has encouraged against. Anticipated changes in roles/activities/participation: little change anticipated Pt/family expectations/goals: supervision - mod i overall  Community Resources [employer] Agencies: None Premorbid Home Care/DME Agencies: None Transportation available at discharge: yes  Discharge Visual merchandiser Resources: Administrator (specify) Herbalist) Financial Resources: Tree surgeon;Family Support Financial Screen Referred: No Living Expenses: Lives with family Money Management: Family Home Management: family Patient/Family Preliminary Plans: return home with daughter and her family into "in-law" apt.  Clinical Impression:  Pleasant, elderly woman here after pneumonia and deconditioned.  A&O x 4.  Daughter hopeful we can encourage more physical activity for pt. While she is here that may carry over to home.      [name] 06/11/2011

## 2011-06-11 NOTE — Progress Notes (Signed)
Physical Therapy Note  Patient Details  Name: [name] MRN: [id number] Date of Birth: 15-Aug-1921 Today's Date: 06/11/2011  [id number], individual therapy. No c/o pain, just fatigue. Gait training with RW negotiating obstacles and stepping over objects. Practiced up/down 2 steps for home entry with left rail and HHA with minA..the patient said she remembered there are 2 steps instead of just one into her house but she can hold onto the frame on one side like she did before. Functional strengthening with sit to stands and dynamic balance without AD to place horseshoes on basketball hoop with close S to intermittent minA during weightshifts. Gait back to pt room with close S with RW x 150', cues for posture. Pt fatigued and A back to bed to rest. Overall pt is making excellent progress.    Karolee Stamps [facility] 06/11/2011, 4:30 PM

## 2011-06-11 NOTE — Progress Notes (Signed)
Occupational Therapy Assessment and Plan  Patient Details  Name: [name] MRN: [id number] Date of Birth: 1922/01/18  10:00-11:00  OT Diagnosis: abnormal posture and muscle weakness (generalized) Rehab Potential: Rehab Potential: Excellent ELOS: 7 days   Assessment & Plan Clinical Impression: Patient is a 77 y.o. year old female with recent admission to the hospital on complicated history of cardiac disease with congestive heart failure as well as chronic underlying lung disease. Admitted 05/28/2011 with cough x2-3 weeks with increased sputum production. Chest x-ray showed some venous hypertension but no edema. CT of the chest on 11/ 8 showed left-sided collapse with mucous plug. Patient had been placed on ventilator for respiratory support. Patient with self extubation on 06/07/2011. Chest x-ray 11/14 with improved aeration.She was seen by speech and placed on a mechanical soft diet with thin liquids.e.  Noted elevated creatinine level of 2.9 felt to be a chronic versus acute.  Patient transferred to CIR on 06/10/2011 .  Patient's past medical history is significant for complicated history of cardiac disease with congestive heart failure as well as chronic underlying lung disease. .   Patient currently requires min with basic self-care skills secondary to decreased cardiorespiratoy endurance.  Prior to hospitalization, patient could complete BADL with modified independence.  Patient will benefit from skilled intervention to increase independence with basic self-care skills prior to discharge home with care partner.  Anticipate patient will require intermittent supervision and follow up home health.  OT - End of Session Activity Tolerance: Tolerates 10 - 20 min activity with multiple rests Endurance Deficit: Yes Endurance Deficit Description: generalized weakness and deconditioning OT Assessment Rehab Potential: Excellent Barriers to Discharge: None OT Plan OT Frequency: 1-2  X/day, 60-90 minutes Estimated Length of Stay: 7 days OT Treatment/Interventions: Balance/vestibular training;DME/adaptive equipment instruction;Functional mobility training;Patient/family education;Self Care/advanced ADL retraining;Therapeutic Activities;Therapeutic Exercise;UE/LE Strength taining/ROM  Precautions/Restrictions  Precautions Precautions:  (Pt on 3 liters oxygen) Restrictions Weight Bearing Restrictions: No  Chart Reviewed: Yes    Pain Pain Assessment Pain Assessment: No/denies pain Pain Score: 0-No pain ADL:   Grooming- supervision (standing at sink) UB bathing- supervision at sink in standing LB bathing- minimal cueing using shower and tub bench UB dressing- supervision Edge of bed LB dressing- supervision sit to stand Toileting= minimal assist Toilet transfer= minimal assist Shower Transfer= minimal assist using tub bench and RW Cognition Overall Cognitive Status: Appears within functional limits for tasks assessed Orientation Level: Oriented X4 Safety/Judgment: Appears intact Sensation Sensation Light Touch: Appears Intact Proprioception: Appears Intact Coordination Gross Motor Movements are Fluid and Coordinated: Yes Fine Motor Movements are Fluid and Coordinated: Yes Motor  Motor Motor: Within Functional Limits (generalized weakness) Mobility  Bed Mobility Bed Mobility: Yes Supine to Sit: 5: Supervision Sitting - Scoot to Edge of Bed: 5: Supervision Sit to Supine - Right: 5: Supervision Transfers Transfers: Yes Sit to Stand: 4: Min assist Sit to Stand Details: Tactile cues for posture Stand to Sit: 4: Min assist Stand to Sit Details (indicate cue type and reason): Verbal cues for technique (to control descent)  Trunk/Postural Assessment  Cervical Assessment Cervical Assessment: Within Functional Limits (forward head) Thoracic Assessment Thoracic Assessment: Exceptions to [facility] (kyphotic posture, cueing for upright posture with  moblity) Lumbar Assessment Lumbar Assessment: Within Functional Limits Postural Control Postural Control: Within Functional Limits Postural Limitations: flexed posture and rounded shoulders  Balance Balance Balance Assessed: Yes Dynamic Sitting Balance Dynamic Sitting - Level of Assistance: 5: Stand by assistance Dynamic Standing Balance Dynamic Standing - Level of Assistance:  4: Min assist Dynamic Standing - Balance Activities: [employer];Reaching for objects;Reaching for weighted objects;Reaching across midline;Forward lean/weight shifting;Lateral lean/weight shifting;Other (comment) (baseball game simulation without A) Extremity/Trunk Assessment      Recommendations for other services: Other: none  Discharge Criteria: Patient will be discharged from OT if patient refuses treatment 3 consecutive times without medical reason, if treatment goals not met, if there is a change in medical status, if patient makes no progress towards goals or if patient is discharged from hospital.  The above assessment, treatment plan, treatment alternatives and goals were discussed and mutually agreed upon: No family available/patient unable.   Treatment was initiated on evaluation.  Pt engaged in transfer, functional ambulation with RW, safety education, dynamic standing balance.  Pt. Had 2 loss of balances during session with pt self righting to regain balance.  She took multiple rest breaks with 3 liters of oxygen.   Humberto Seals 06/11/2011, 11:54 AM

## 2011-06-11 NOTE — Progress Notes (Signed)
Patient information reviewed and entered into [employer] system by Bryna Razavi, RN, CRRN, PPS Coordinator.  Information including medical coding and functional independence measure will be reviewed and updated through discharge.     Per nursing patient was given "Data Collection Information Summary for Patients in Inpatient Rehabilitation Facilities with attached "Privacy Act Statement-Health Care Records" upon admission.   

## 2011-06-11 NOTE — Plan of Care (Signed)
Problem: RH SKIN INTEGRITY Goal: RH STG SKIN FREE OF INFECTION/BREAKDOWN Outcome: Progressing Skin free of injury, no signs or symptoms of infection.

## 2011-06-12 NOTE — Progress Notes (Addendum)
Occupational Therapy Session Note  Patient Details  Name: [name] MRN: [id number] Date of Birth: November 22, 1921  Today's Date: 06/12/2011 Time: 0810-0930 Time Calculation (min): 80 min  Precautions: Precautions Precautions:  (Pt on 3 liters oxygen) Restrictions Weight Bearing Restrictions: No   Skilled Therapeutic Interventions/Progress Updates:  Toileted and then completed ADL standing at sink with focus on safe, functional ambulation with oxygen cord; standing endurance and increasing conditioning and Right LE b/d; patient with good safety awareness of oxygen cord       Pain no c/os   Other Treatments    Therapy/Group: Individual Therapy   Bud Face [facility] 06/12/2011, 9:38 AM

## 2011-06-12 NOTE — Progress Notes (Signed)
Occupational Therapy Session Note  Patient Details  Name: [name] MRN: [id number] Date of Birth: 08-02-1921  Today's Date: 06/12/2011 Time: 1400-1430 Time Calculation (min): 30 min  Precautions: Precautions Precautions:  (Pt on 3 liters oxygen) Restrictions Weight Bearing Restrictions: No   Skilled Therapeutic Interventions/Progress Updates: focused on balance reactions standing at walker for home making tasks and endurance training; patient SOB after about 5 minutes standing activities    Pain no c/os  Therapy/Group: Individual Therapy  Bud Face [facility] 06/12/2011, 4:13 PM

## 2011-06-12 NOTE — Progress Notes (Signed)
Physical Therapy Session Note  Patient Details  Name: [name] MRN: [id number] Date of Birth: 05/09/1922  Today's Date: 06/12/2011 Time: 2841-3244 Time Calculation (min): 55 min  Precautions: Precautions Precautions:  (Pt on 3 liters oxygen) Restrictions Weight Bearing Restrictions: No      Skilled Therapeutic Interventions/Progress Updates: Gait group to improve ambulation safety/endurance with appropriate assistive device on level/steps           Pain No complaint of pain     Locomotion 150 feet X 2 with rolling walker SBA for safety only/ up-down 2 steps with one rail sideways                     Therapy/Group: Group Therapy  Wess Baney,JIM 06/12/2011, 2:15 PM

## 2011-06-12 NOTE — Progress Notes (Signed)
Speech Language Pathology  Speech Language Pathology Assessment and Plan  SLP Diagnosis: Questionable dysphagia with silent aspiration   Clinical Impressions: 77 y.o. female with a long complicated history of cardiac disease with congestive heart failure as well as chronic underlying lung disease. Admitted 05/28/2011 with cough x2-3 weeks with increased sputum production. Chest x-ray showed some venous hypertension but no edema. CT of the chest on 11/ 8 showed left-sided collapse with mucous plug. Patient had been placed on ventilator for respiratory support. Patient with self extubation on 06/07/2011. Chest x-ray 11/14 with improved aeration.She was seen by speech and placed on a mechanical soft diet with thin liquids. Noted elevated creatinine level of 2.9 felt to be a chronic versus acute. Admitted to CIR on 06/10/11 and presents with improved vocal quality, productive cough and overall cognitive function. BSE today to assess for possible dysphagia and safety with current diet of regular textures and thin liquids.   Rehab Potential: good for goals  ELOS: ~1 week   Therapy Start Time/End Time: Time Calculation Start Time: 0930 Stop Time: 1030 Time Calculation (min): 60 min Clinical/Bedside Swallow Evaluation  Past Medical History:  Past Medical History  Diagnosis Date  . Acute on chronic diastolic congestive heart failure 05/28/2011  . Hypertensive heart disease without CHF 05/28/2011  . History of pulmonary embolus (PE) 05/28/2011    2006 following pelvic fracture  . Chronic venous insufficiency 05/28/2011  . Hyperlipidemia 05/28/2011  . Gout 05/28/2011  . Asthma 05/28/2011  . History of GI diverticular bleed 05/28/2011    June 2012  . Atrial fibrillation 05/28/2011  . Chronic kidney disease stage 3 05/28/2011  . Kidney stones   . Osteoporosis   . Pulmonary hypertension 05/28/2011  . GERD (gastroesophageal reflux disease) 05/28/2011  . Mitral regurgitation 05/31/2011  . Secondary  hyperparathyroidism (of renal origin)   . Anemia of chronic disease   . History of CVA (cerebrovascular accident)    Past Surgical History:  Past Surgical History  Procedure Date  . Partial colectomy     1999 following colonoscopy complication  . Left hip replacement     2004, complicated with dislocation  . Right hip replacement     2005  . Hernia repair   . Bilateral cataract surgery     Assessment/Recommendations/Treatment Plan  SLP Assessment: Pt consumed regular textures and thin liquids without overt s/s of aspiration. Overall, pt's oral and pharyngeal swallow appeared [facility] with laryngeal palpitation. However, due to pt's history of recent PNA and decreased respiratory statues, silent aspiration cannot be ruled out. Review of pt's chart show overall lung aeration appears to be clearing and respiratory status improving. Will discuss results of BSE with MD to determine if MBSS is necessary to r/o silent aspiration.  Risk for Aspiration:Mild Other Related Risk Factors: History of PNA and decreased respiratory status    Recommendations Solid Consistency: Regular Liquid Consistency: Thin Medication Administration: Whole meds with liquid Supervision: Patient able to self feed Postural Changes and/or Swallow Maneuvers: Seated upright 90 degrees Oral Care Recommendations: Oral care BID  Prognosis Prognosis for Safe Diet Advancement: Good  Individuals Consulted Consulted and Agree with Results and Recommendations: Patient  Swallow Study Goals  SLP Swallowing Goals Patient will consume recommended diet without observed clinical signs of aspiration with: Modified independent assistance Patient will utilize recommended strategies during swallow to increase swallowing safety with: Modified independent assistance  Swallow Study Prior Functional Status  Pt was living with daughter but was independent for ADL's and consuming regular textures  and thin liquids.   General  Date of  Onset: 06/07/11 Type of Study: Bedside swallow evaluation Diet Prior to this Study: Regular;Thin liquids Temperature Spikes Noted: No Respiratory Status: Supplemental O2 delivered via (comment) (Nasal Cannula) History of Intubation: Yes Length of Intubations (days): 3 days (3 days) Date extubated: 06/06/11 (06/06/11) Behavior/Cognition: Alert;Cooperative;Pleasant mood Oral Cavity - Dentition: Adequate natural dentition Vision: Functional for self-feeding Patient Positioning: Upright in chair Baseline Vocal Quality: Normal Volitional Cough: Strong Volitional Swallow: Able to elicit Ice chips: Not tested  Oral Motor/Sensory Function  Labial ROM: Within Functional Limits Labial Symmetry: Within Functional Limits Labial Strength: Within Functional Limits Lingual ROM: Within Functional Limits Lingual Symmetry: Within Functional Limits Lingual Strength: Within Functional Limits Facial ROM: Within Functional Limits Facial Symmetry: Within Functional Limits Facial Strength: Within Functional Limits Velum: Within Functional Limits Mandible: Within Functional Limits  Consistency Results  Ice Chips Ice chips: Not tested  Thin Liquid Thin Liquid: Within functional limits Presentation: Cup;Straw  Nectar Thick Liquid Nectar Thick Liquid: Not tested  Honey Thick Liquid Honey Thick Liquid: Not tested  Puree Puree: Not tested  Solid Solid: Within functional limits   Manus Weedman 06/12/2011,10:18 AM    General: SLP Visit Information SLP Received On: 06/12/11 Vital Signs: Therapy Vitals Temp: 98.9 F (37.2 C) Temp src: Oral Pulse Rate: 80  Resp: 22  BP: 165/85 mmHg Patient Position, if appropriate: Lying Oxygen Therapy SpO2: 94 % O2 Device: Nasal cannula O2 Flow Rate (L/min): 2 L/min Pain: Pain Assessment Pain Assessment: No/denies pain Pain Score: 0-No pain Pain Type: Acute pain Pain Location: Leg Pain Orientation: Right;Left Pain Descriptors: Aching Pain  Frequency: Intermittent Pain Onset: Awakened from sleep Pain Intervention(s): Medication (See eMAR);Repositioned Prior Functioning:  Prior Functional Status Type of Home: House Lives With: Daughter Receives Help From: Family Cognition: Cognition Overall Cognitive Status: Appears within functional limits for tasks assessed Orientation Level: Oriented X4 Safety/Judgment: Appears intact Oral/Motor: Oral Motor/Sensory Function Labial ROM: Within Functional Limits Labial Symmetry: Within Functional Limits Labial Strength: Within Functional Limits Lingual ROM: Within Functional Limits Lingual Symmetry: Within Functional Limits Lingual Strength: Within Functional Limits Facial ROM: Within Functional Limits Facial Symmetry: Within Functional Limits Facial Strength: Within Functional Limits Velum: Within Functional Limits Mandible: Within Functional Limits Assessment/Plan: 5/week x 1week  FIM: Comprehension Comprehension Mode: Auditory Comprehension: 6-Follows complex conversation/direction: With extra time/assistive device (stated, "I think I near a hearing aide. Your voice is soft.") Expression Expression Mode: Verbal Expression: 6-Expresses complex ideas: With extra time/assistive device Social Interaction Social Interaction: 6-Interacts appropriately with others with medication or extra time (anti-anxiety, antidepressant). Problem Solving Problem Solving: 6-Solves complex problems: With extra time Memory Memory: 6-More than reasonable amt of time   Recommendations for other services: N/A  Discharge Criteria: Patient will be discharged from SLP if patient refuses treatment 3 consecutive times without medical reason, if treatment goals not met, if there is a change in medical status, if patient makes no progress towards goals or if patient is discharged from hospital.  The above assessment, treatment plan, treatment alternatives and goals were discussed and mutually agreed upon: by  patient  Therapeutic Intervention: BSE administered. Please see above assessment for results. Recommend to continue with diet of regular textures and thin liquids. Will discuss results with MD to determine if MBSS is needed to r/o silent aspiration.   Fredrik Mogel 06/12/2011 10:18 AM

## 2011-06-13 NOTE — Progress Notes (Signed)
   Subjective/Complaints: Did very well last night. Denies shortness of breath or cough. No pain.  ROS negative;  Remains on chronic O2;  Only c/o is cold sore.  No pulmonary complaints.  Objective: Vital Signs: Blood pressure 174/69, pulse 94, temperature 98.8 F (37.1 C), temperature source Oral, resp. rate 18, height 5\' 3"  (1.6 m), weight 152 lb 8.9 oz (69.2 kg), SpO2 98.00%. No results found.  Basename 06/11/11 0650 06/10/11 1728  WBC 9.7 10.1  HGB 9.6* 10.5*  HCT 30.9* 33.5*  PLT 253 276    Basename 06/11/11 0650 06/10/11 1728  NA 145 --  K 3.9 --  CL 103 --  CO2 34* --  GLUCOSE 99 --  BUN 35* --  CREATININE 1.60* 1.71*  CALCIUM 8.9 --   CBG (last 3)  No results found for this basename: GLUCAP:3 in the last 72 hours  Wt Readings from Last 3 Encounters:  06/10/11 152 lb 8.9 oz (69.2 kg)  06/10/11 148 lb 9.6 oz (67.405 kg)  06/10/11 148 lb 9.6 oz (67.405 kg)    Physical Exam:  General appearance: alert Head: Normocephalic, without obvious abnormality, atraumatic Eyes: conjunctiva on right is red/irritated Ears: normal TM's and external ear canals both ears Nose: Nares normal. Septum midline. Mucosa normal. No drainage or sinus tenderness. Throat: lips, mucosa, and tongue normal; teeth and gums normal Neck: no adenopathy, no carotid bruit, no JVD, supple, symmetrical, trachea midline and thyroid not enlarged, symmetric, no tenderness/mass/nodules;  N/c O2 in place Back: negative, symmetric, no curvature. ROM normal. No CVA tenderness. Resp: rales bibasilar Cardio: regular rate and rhythm, S1, S2 normal, no murmur, click, rub or gallop;  Resting HR 100 GI: soft, non-tender; bowel sounds normal; no masses,  no organomegaly Extremities: extremities normal, atraumatic, no cyanosis or edema Pulses: 2+ and symmetric Skin: Skin color, texture, turgor normal. No rashes or lesions Neurologic: Grossly normal Incision/Wound: not applicable    Assessment/Plan: 1.  Functional deficits secondary to deconditioning from respiratory failiure which require 3+ hours per day of interdisciplinary therapy in a comprehensive inpatient rehab setting. Physiatrist is providing close team supervision and 24 hour management of active medical problems listed below. Physiatrist and rehab team continue to assess barriers to discharge/monitor patient progress toward functional and medical goals.  evals underway today  Mobility: Bed Mobility Bed Mobility: Yes Supine to Sit: 5: Supervision Sitting - Scoot to Edge of Bed: 5: Supervision Sit to Supine - Right: 5: Supervision Transfers Transfers: Yes Sit to Stand: 4: Min assist Stand to Sit: 4: Min assist Stand Pivot Transfers: 4: Min assist Ambulation/Gait Ambulation/Gait Assistance: 5: Supervision Ambulation Distance (Feet): 75 Feet Assistive device: Rolling walker Stairs: Yes Stairs Assistance: 4: Min assist Stair Management Technique: Two rails;With walker Number of Stairs: 5  Wheelchair Mobility Wheelchair Mobility: Yes (gait primary means of mobility) Wheelchair Assistance: 1: +1 Total assist Wheelchair Parts Management: Needs assistance ADL:   Cognition: Cognition Overall Cognitive Status: Appears within functional limits for tasks assessed Orientation Level: Oriented X4 Safety/Judgment: Appears intact Cognition Orientation Level: Oriented X4   Medical Problem List and Plan:  1 respiratory failure/pneumonia. Wean oxygen as able. IS q1-2 hours.  Albuterol nebs 2.DVTprophylaxis-S.Q.heparin and gait 3.CRI-STRICT I&O. Follow up labs pending 4.Asthma-nebs as advised.  Moving air well on exam. 5.Htn/a-fib. Amiodarone,cardura.Monitor with increased activity. Comfortable at rest.  6.GERD-protonix  7.hyperlipidemia-pravachol  8.mood-ego support. Pt in good spirits. 9.Chronic anemia-add iron supplement

## 2011-06-13 NOTE — Progress Notes (Signed)
Patient alert and oriented x 3- Patient using call bell appropriately. Patient continent of bowel and bladder with last bowel movement on the 11/18. Patient has bruises to abdomen from heparin shots. Patient has redness to groin- using nystatin powder. Patient has reported no pain this shift. Pt right eye red and swollen. Patient able to ambulate with walker with supervision.  No new complaints noted this shift. Continue with plan of care. Patient on 3 L oxygen [place] with oxygen saturation in the mid 90s.

## 2011-06-13 NOTE — Progress Notes (Signed)
Physical Therapy Session Note  Patient Details  Name: [name] MRN: [id number] Date of Birth: [birth year]  Today's Date: 06/13/2011 Time: 4098-1191 Time Calculation (min): 55 min  Precautions:  Precautions:  Pt on 2 liters oxygen  Weight Bearing Restrictions: No   Skilled Therapeutic Interventions/Progress Updates:   [name] was seen today for walking group which included gait training with RW as well as straight cane, ascending and descending steps and administration of BERG balance assessment.  [name] was able to advance to use of a straight cane with therapist today due to her increased independence with the RW.  Patient demonstrates increased fall risk as noted by score of  40 /56 on Berg Balance Scale without the use of an assistive device.  (<36= high risk for falls, close to 100%; 37-45 significant >80%; 46-51 moderate >50%; 52-55 lower >25%)  Vital Signs Pulse ox remained between 93-96% with all activities in therapy today. Pain No c/o pain today. Locomotion  Patient amb with RW x 150' x 2 with close S with no significant deviations.  She was able to ascend and descend 8 steps with 1 rail on the right with min A and verbal cues.  She was able to amb with straight cane x 75' with min A for safety.     Balance Standardized Balance Assessment Standardized Balance Assessment: Berg Balance Test Berg Balance Test Sit to Stand: Able to stand  independently using hands Standing Unsupported: Able to stand 2 minutes with supervision Sitting with Back Unsupported but Feet Supported on Floor or Stool: Able to sit safely and securely 2 minutes Stand to Sit: Sits safely with minimal use of hands Transfers: Able to transfer safely, minor use of hands Standing Unsupported with Eyes Closed: Able to stand 10 seconds with supervision Standing Ubsupported with Feet Together: Able to place feet together independently and stand for 1 minute with supervision From Standing,  Reach Forward with Outstretched Arm: Can reach confidently >25 cm (10") From Standing Position, Pick up Object from Floor: Able to pick up shoe, needs supervision From Standing Position, Turn to Look Behind Over each Shoulder: Looks behind from both sides and weight shifts well Turn 360 Degrees: Able to turn 360 degrees safely but slowly Standing Unsupported, Alternately Place Feet on Step/Stool: Able to stand independently and complete 8 steps >20 seconds Standing Unsupported, One Foot in Front: Loses balance while stepping or standing Standing on One Leg: Unable to try or needs assist to prevent fall Total Score: 40    Therapy/Group: Group Therapy  Seraiah Nowack,TAMMY 06/13/2011, 3:21 PM

## 2011-06-13 NOTE — Progress Notes (Signed)
Subjective/Complaints: Did very well last night. Denies shortness of breath or cough. No pain.  ROS negative;  Remains on chronic O2;  Only c/o is cold sore.  No pulmonary complaints.  Objective: Vital Signs: Blood pressure 174/69, pulse 94, temperature 98.8 F (37.1 C), temperature source Oral, resp. rate 18, height 5\' 3"  (1.6 m), weight 152 lb 8.9 oz (69.2 kg), SpO2 98.00%. No results found.  Basename 06/11/11 0650 06/10/11 1728  WBC 9.7 10.1  HGB 9.6* 10.5*  HCT 30.9* 33.5*  PLT 253 276    Basename 06/11/11 0650 06/10/11 1728  NA 145 --  K 3.9 --  CL 103 --  CO2 34* --  GLUCOSE 99 --  BUN 35* --  CREATININE 1.60* 1.71*  CALCIUM 8.9 --   CBG (last 3)  No results found for this basename: GLUCAP:3 in the last 72 hours  Wt Readings from Last 3 Encounters:  06/10/11 152 lb 8.9 oz (69.2 kg)  06/10/11 148 lb 9.6 oz (67.405 kg)  06/10/11 148 lb 9.6 oz (67.405 kg)   BP Readings from Last 3 Encounters:  06/13/11 174/69  06/10/11 142/69  06/10/11 142/69    Physical Exam:  General appearance: alert Head: Normocephalic, without obvious abnormality, atraumatic Eyes: conjunctiva on right is red/irritated Ears: normal TM's and external ear canals both ears Nose: Nares normal. Septum midline. Mucosa normal. No drainage or sinus tenderness. Throat: lips, mucosa, and tongue normal; teeth and gums normal Neck: no adenopathy, no carotid bruit, no JVD, supple, symmetrical, trachea midline and thyroid not enlarged, symmetric, no tenderness/mass/nodules;  N/c O2 in place Back: negative, symmetric, no curvature. ROM normal. No CVA tenderness. Resp: rales bibasilar Cardio: regular rate and rhythm, S1, S2 normal, no murmur, click, rub or gallop;  Resting HR 100 GI: soft, non-tender; bowel sounds normal; no masses,  no organomegaly Extremities: extremities normal, atraumatic, no cyanosis or edema Pulses: 2+ and symmetric Skin: Skin color, texture, turgor normal. No rashes or  lesions Neurologic: Grossly normal Incision/Wound: not applicable    Assessment/Plan: 1. Functional deficits secondary to deconditioning from respiratory failiure which require 3+ hours per day of interdisciplinary therapy in a comprehensive inpatient rehab setting. Physiatrist is providing close team supervision and 24 hour management of active medical problems listed below. Physiatrist and rehab team continue to assess barriers to discharge/monitor patient progress toward functional and medical goals.  evals underway today  Mobility: Bed Mobility Bed Mobility: Yes Supine to Sit: 5: Supervision Sitting - Scoot to Edge of Bed: 5: Supervision Sit to Supine - Right: 5: Supervision Transfers Transfers: Yes Sit to Stand: 4: Min assist Stand to Sit: 4: Min assist Stand Pivot Transfers: 4: Min assist Ambulation/Gait Ambulation/Gait Assistance: 5: Supervision Ambulation Distance (Feet): 75 Feet Assistive device: Rolling walker Stairs: Yes Stairs Assistance: 4: Min assist Stair Management Technique: Two rails;With walker Number of Stairs: 5  Wheelchair Mobility Wheelchair Mobility: Yes (gait primary means of mobility) Wheelchair Assistance: 1: +1 Total assist Wheelchair Parts Management: Needs assistance ADL:   Cognition: Cognition Overall Cognitive Status: Appears within functional limits for tasks assessed Orientation Level: Oriented X4 Safety/Judgment: Appears intact Cognition Orientation Level: Oriented X4   Medical Problem List and Plan:  1 respiratory failure/pneumonia. Wean oxygen as able. IS q1-2 hours.  Albuterol nebs-improving 2.DVTprophylaxis-S.Q.heparin and gait 3.CRI-STRICT I&O. Follow up labs pending 4.Asthma-nebs as advised.  Moving air well on exam. 5.Htn/a-fib. Amiodarone,cardura.Monitor with increased activity. Comfortable at rest.  6.GERD-protonix  7.hyperlipidemia-pravachol  8.mood-ego support. Pt in good spirits. 9.Chronic anemia-add  iron  supplemen 10.  HTN- BP up this am- will continue to observe

## 2011-06-14 MED ORDER — ASPIRIN 81 MG PO CHEW: 81.0000 mg | CHEWABLE_TABLET | Freq: Every day | ORAL | Status: AC

## 2011-06-14 NOTE — Progress Notes (Signed)
Recreational Therapy Assessment and Plan  Patient Details  Name: [name] MRN: [id number] Date of Birth: 1922-07-19  Rehab Potential: Good ELOS: 7 days   Assessment Clinical Impression: [name] is an 77 y.o. female with a long complicated history of cardiac disease with congestive heart failure as well as chronic underlying lung disease. Admitted 05/28/2011 with cough x2-3 weeks with increased sputum production. Chest x-ray showed some venous hypertension but no edema. CT of the chest on 11/ 8 showed left-sided collapse with mucous plug. Patient had been placed on ventilator for respiratory support. Patient with self extubation on 06/07/2011. Chest x-ray 11/14 with improved aeration.She was seen by speech and placed on a mechanical soft diet with thin liquids. Patient transferred to CIR on 06/10/2011 . Patient's past medical history is significant for cardiac disease, CHF, and bilateral hip replacements.   Patient presents with decreased activity tolerance, decreased functional mobility, decreased balance, and decreased oxygen support limiting pt's independence with leisure/community pursuits.  Met with pt and discussed leisure interests and community pursuits.  Due to anticipated discharge tomorrow, no formal TR treatment implemented. Pt expressed plan to remain active post d/c.   Recreational Therapy Leisure History/Participation Premorbid leisure interest/current participation: Games - Word-search;Community - Press photographer - Physicist, medical Expression Interests: Music (Comment) Spiritual Interests: Church Other Leisure Interests: Television;Reading;Cooking/Baking Leisure Participation Style: Alone;With Family/Friends Awareness of Community Resources: Good-identify 3 post discharge leisure resources [employer] Does patient have pets?: No (family has animals) Recreational Therapy Orientation Orientation -Reviewed with patient: Available activity  resources Strengths/Weaknesses Patient weaknesses: Physical limitations;Minimal Premorbid Leisure Activity  Plan No formal TR due to ELOS.  The above assessment, treatment plan, treatment alternatives and goals were discussed and mutually agreed upon: the patient.  [name] 06/14/2011, 3:31 PM

## 2011-06-14 NOTE — Progress Notes (Signed)
Social Work  Have spoken with patient and daughter about discharge tomorrow.  Daughter a little apprehensive but agreeable. She plans to be here tomorrow at 10am to go through therapy and meet with PA to review d/c instructions. Aware I will be arranging follow up [facility] therapy.    [name]

## 2011-06-14 NOTE — Discharge Summary (Signed)
NAMEENEDINA, PAIR NO.:  [id number]  MEDICAL RECORD NO.:  [id number]  LOCATION:  4007                         FACILITY:  [facility]  PHYSICIAN:  Mariam Dollar, P.A.  DATE OF BIRTH:  [birth year]  DATE OF ADMISSION:  06/09/2011 DATE OF DISCHARGE:  06/15/2011                              DISCHARGE SUMMARY   DISCHARGE DIAGNOSES: 1. Deconditioning with respiratory failure/pneumonia. 2. Subcutaneous heparin for deep vein thrombosis prophylaxis. 3. Chronic renal insufficiency. 4. Asthma. 5. Hypertension. 6. Gastroesophageal reflux disease. 7. Hyperlipidemia. 8. Chronic anemia.  This is a 77 year old female, long and complicated cardiac history with congestive heart failure, admitted May 28, 2011 with cough for 2-3 weeks with increased sputum production.  Chest x-ray showed venous hypertension, but no edema.  CT of the chest, on November 8, showed left- sided collapse with mucus plug.  The patient had been placed on a ventilator for respiratory support.  She self extubated herself on June 07, 2011.  Chest x-ray, November 14, with improved aeration. She was seen by Speech Therapy and placed on mechanical soft diet with thin liquids.  Noted elevated creatinine 2.9, felt to be chronic versus acute.  The patient required minimal assistance for sitting at the edge of the bed, ambulating 25 feet with limited endurance.  She was seen by Inpatient Rehab Services, felt to be a good candidate for comprehensive rehab program.  PAST MEDICAL HISTORY:  See discharge diagnoses.  SOCIAL HISTORY:  The patient with good support of family.  They could provide necessary supervision at home.  She lives in a one-level house.  FUNCTIONAL HISTORY:  Prior to admission was independent.  Functional status upon admission to Rehab Services was minimal assist for bed mobility and ambulation.  ALLERGIES:  LISINOPRIL, PRIMIDONE, CLONIDINE.  PHYSICAL EXAMINATION:  VITAL SIGNS:  Blood  pressure 165/79, pulse 83, temperature 98, respirations 20. GENERAL:  This was an alert female, mildly anxious, no short of breath. CARDIAC:  Regular rhythm. CHEST:  Some mild wheezes with good inspiratory effort. ABDOMEN:  Soft, nontender.  Good bowel sounds. MUSCULOSKELETAL:  She exhibits no edema.  She is oriented x3.  Manual muscle testing is 4/5.  REHABILITATION HOSPITAL COURSE:  The patient was admitted to Inpatient Rehab Services with therapies initiated on a 3-hour daily basis consisting of physical therapy, occupational therapy, and rehabilitation nursing.  The following issues were addressed during the patient's rehabilitation stay.  Pertaining to Ms. Ringler's acute respiratory failure due to pneumonia and congestive heart failure, remained stable. She exhibited no signs of fluid overload.  Latest chest x-ray showed good inspiratory effort.  She remained afebrile.  Oxygen saturations 92% on room air.  Attempts were made to wean her from oxygen; however, this was difficult with endurance with the activity noted oxygen saturations 85% thus home-health oxygen had been arranged.  She was on subcutaneous heparin until time her mobility improved and that was later discontinued due to some increased bruising.  She was now supervision for ambulation greater than 150 feet.  Chronic renal insufficiency with creatinine of 2.9 and monitored. Latest creatinine did improve to 1.60.  She would follow up with her primary care provider in regards to her renal function.  She did have  a long complicated cardiac history.  She remained on amiodarone, Cardura, and no orthostatic changes were noted.  Pravachol ongoing for hyperlipidemia.  The patient received weekly collaborative interdisciplinary team conferences to discuss estimated length of stay, family teaching, and any barriers to her discharge.  She was supervision for ambulating with an assistive device.  Her strength and endurance  had greatly improved.  She received full education and monitoring of her oxygen tube for safety issues.  She was supervision for activities of daily living, needing some minimal assistance for donning and doffing her socks.  She remained continent of bowel and bladder.  Plans will be discharge to home with family assistance, follow up as needed.  DISCHARGE MEDICATIONS: 1. Amiodarone 200 mg 3 times a week at Monday, Wednesday, Friday. 2. Aspirin 81 mg daily. 3. Symbicort 2 puffs 2 times daily. 4. Bumex 2 mg daily. 5. Vitamin D 1000 units daily. 6. Cardizem CD 180 mg daily. 7. Cardura 4 mg at bedtime. 8. Ferrous sulfate 325 mg b.i.d. 9. Singulair 10 mg daily as needed. 10.Multivitamin 1 tablet as needed. 11.Prilosec 20 mg daily. 12.Pravachol 20 mg daily.  DIET:  Regular.  SPECIAL INSTRUCTIONS:  Continue therapies as advised per [employer]. Should follow with her primary care provider in 1 week as advised, Dr. Faith Rogue outpatient rehab service office as needed.     Mariam Dollar, P.A.     DA/MEDQ  D:  06/14/2011  T:  06/14/2011  Job:  [id number]  cc:   Ranelle Oyster

## 2011-06-14 NOTE — Progress Notes (Signed)
Speech Language Pathology Discharge Summary  Speech Language Pathology Discharge Summary  Individual Session: [phone] Pain: 0  Skilled intervention: Patient consumed peantbutter cracker and water via straw with no overt s/s of aspiration.  Patient presents with baseline cough however; none observed after p.o.  Secretions are clear, temperature is WNL.   Long term goals set: 2  Long term goals met: 2  Comments on progress toward goals: Patient is independently consuming regular textures and thin liquids without any modifications or restrictions and consistently utilizes an upright posture without s/s of aspiration.  As a result, all goals are met and SLP services are not longer needed.  No further up required at this time.  Reasons goals not met: n/a  Equipment acquired: none  Reasons for discharge: treatment goals met  Follow-up: none  Patient/family agrees with progress made and goals achieved: Yes, patient  [name] 06/14/2011

## 2011-06-14 NOTE — Progress Notes (Signed)
Occupational Therapy Session Note  Patient Details  Name: [name] MRN: [id number] Date of Birth: Dec 29, 1921  Today's Date: 06/14/2011 Time: 9528-4132 Time Calculation (min): 55 min  Precautions: Precautions Precautions:  (Pt on 3 liters oxygen) Restrictions Weight Bearing Restrictions: No  Skilled Therapeutic Interventions/Progress Updates:    Skilled intervention focusing on functional mobility throughout ADL apartment, simulated shower stall/walk-in transfer using shower seat and rolling walker to increase independence, toilet transfers using elevated toilet seat and rolling walker. Worked on ambulatory transfers using rolling walker with and without oxygen and having patient manage oxygen cord for safety with rolling walker. Oxygen sats checked throughout session, sats stayed <90 during most activity and >90 mostly during no activity. Case manager notified.   Pain Pain Assessment Pain Assessment: No/denies pain Pain Score: 0-No pain  Therapy/Group: Individual Therapy  Nesreen Albano 06/14/2011, 3:10 PM

## 2011-06-14 NOTE — Progress Notes (Signed)
Occupational Therapy Session Note  Patient Details  Name: [name] MRN: [id number] Date of Birth: 1922-02-07  Today's Date: 06/14/2011 Time: 0915-1020 Time Calculation (min): 65 min   Long Term = Short Term Goals:  Mod I with BADLs. Supervision with shower transfer    Skilled Therapeutic Interventions/Progress Updates: Pt seen for ADL retraining with bathing and dressing at shower level with a focus on activity tolerance, dynamic standing balance, ambulation within room, shower transfers.  P.A. Requested pt be removed from O2 as a trial, but her sats decreased to 85 without oxygen.  Pt demonstrated good balance with her mobility, but supervision is recommended as pt needs to ambulate with assistive device and oxygen tubing.  With extra time, pt. able to complete her basic self care tasks.  Pt. Demo. Good activity tolerance by standing for 10 minutes+ at sink for grooming tasks.  Pt is ready for discharge to home with supervision assist from her family.     General General Chart Reviewed: Yes Vital Signs Oxygen Therapy SpO2: 96 % O2 Device: Nasal cannula O2 Flow Rate (L/min): 2 L/min Pulse Oximetry Type: Intermittent Pain Pain Assessment Pain Assessment: No/denies pain Pain Score: 0-No pain        Therapy/Group: Individual Therapy  Vandy Tsuchiya 06/14/2011, 10:41 AM

## 2011-06-14 NOTE — Progress Notes (Signed)
Physical Therapy Note  Patient Details  Name: [name] MRN: [id number] Date of Birth: [birth year] Today's Date: 06/14/2011  [phone], individual therapy, no c/o pain. Practiced O2 tubing management in pt room to simulate home environment, going to bathroom, gait with RW and pt able to manage tubing initially with cueing progressing to modified independent. Gait to therapy gym x 150' with RW, good cadence and posture noted. Discussed RW v. Cane, and recommend RW at this point to be able to be modified independent as well as energy conservation. Per report, pt required minA with cane, and encouraged pt that Home Health therapy can continue to work on gait training with her cane and if oxygen can be weaned, it will be easier for her as well. Pt in agreement with use of RW stating she feels more independent with it. S with up/down curb step with RW, and S for 12 steps with 2 rails for general strengthening and in preparation for community mobility. Plan to d/c tomorrow after AM therapies at overall modified independent level - pt in agreement as well.  Karolee Stamps [facility] 06/14/2011, 1:59 PM

## 2011-06-14 NOTE — Progress Notes (Signed)
Subjective/Complaints: Did very well last night. Denies shortness of breath or cough. No pain.  ROS negative;  Remains on chronic O2;  Belly sore from heparin injections.  Objective: Vital Signs: Blood pressure 154/89, pulse 70, temperature 98.3 F (36.8 C), temperature source Oral, resp. rate 19, height 5\' 3"  (1.6 m), weight 69.2 kg (152 lb 8.9 oz), SpO2 97.00%. No results found. No results found for this basename: WBC:2,HGB:2,HCT:2,PLT:2 in the last 72 hours No results found for this basename: NA:2,K:2,CL:2,CO2:2,GLUCOSE:2,BUN:2,CREATININE:2,CALCIUM:2 in the last 72 hours CBG (last 3)  No results found for this basename: GLUCAP:3 in the last 72 hours  Wt Readings from Last 3 Encounters:  06/10/11 69.2 kg (152 lb 8.9 oz)  06/10/11 67.405 kg (148 lb 9.6 oz)  06/10/11 67.405 kg (148 lb 9.6 oz)   BP Readings from Last 3 Encounters:  06/14/11 154/89  06/10/11 142/69  06/10/11 142/69    Physical Exam:  General appearance: alert Head: Normocephalic, without obvious abnormality, atraumatic Eyes: conjunctiva on right is red/irritated Ears: normal TM's and external ear canals both ears Nose: Nares normal. Septum midline. Mucosa normal. No drainage or sinus tenderness. Throat: lips, mucosa, and tongue normal; teeth and gums normal Neck: no adenopathy, no carotid bruit, no JVD, supple, symmetrical, trachea midline and thyroid not enlarged, symmetric, no tenderness/mass/nodules;  N/c O2 in place Back: negative, symmetric, no curvature. ROM normal. No CVA tenderness. Resp: rales bibasilar Cardio: regular rate and rhythm, S1, S2 normal, no murmur, click, rub or gallop;  Resting HR 100 GI: soft, non-tender; bowel sounds normal; no masses,  no organomegaly Extremities: extremities normal, atraumatic, no cyanosis or edema Pulses: 2+ and symmetric Skin: Skin color, texture, turgor normal. No rashes or lesions Neurologic: Grossly normal Incision/Wound: not  applicable    Assessment/Plan: 1. Functional deficits secondary to deconditioning from respiratory failiure which require 3+ hours per day of interdisciplinary therapy in a comprehensive inpatient rehab setting. Physiatrist is providing close team supervision and 24 hour management of active medical problems listed below. Physiatrist and rehab team continue to assess barriers to discharge/monitor patient progress toward functional and medical goals.  Moving quite well.  Working on cane for ambulation.  Mobility: Bed Mobility Bed Mobility: Yes Supine to Sit: 5: Supervision Sitting - Scoot to Edge of Bed: 5: Supervision Sit to Supine - Right: 5: Supervision Transfers Transfers: Yes Sit to Stand: 4: Min assist Stand to Sit: 4: Min assist Stand Pivot Transfers: 4: Min assist Ambulation/Gait Ambulation/Gait Assistance: 5: Supervision Ambulation Distance (Feet): 75 Feet Assistive device: Rolling walker Stairs: Yes Stairs Assistance: 4: Min assist Stair Management Technique: Two rails;With walker Number of Stairs: 5  Wheelchair Mobility Wheelchair Mobility: Yes (gait primary means of mobility) Wheelchair Assistance: 1: +1 Total assist Wheelchair Parts Management: Needs assistance ADL:   Cognition: Cognition Overall Cognitive Status: Appears within functional limits for tasks assessed Orientation Level: Oriented X4;Other (Comment) (very mild STMD) Safety/Judgment: Appears intact Cognition Orientation Level: Oriented X4;Other (Comment) (very mild STMD)   Medical Problem List and Plan:  1 respiratory failure/pneumonia. Wean oxygen as able. IS q1-2 hours.  Albuterol nebs-improving  O2 not weaned. Try today  Improved activity tolerance 2.DVTprophylaxis-d/c sq heparin as she's walking and the heparin is causing bruising and bleeding. 3.CRI-STRICT I&O. Intake/output balanced. 4.Asthma-nebs as advised.  Moving air well on exam. 5.Htn/a-fib. Amiodarone,cardura.Monitor with  increased activity. Comfortable at rest.  6.GERD-protonix  7.hyperlipidemia-pravachol  8.mood-ego support. Pt in good spirits. 9.Chronic anemia-add iron supplemen 10.  HTN- BP up this am-  will continue to observe

## 2011-06-14 NOTE — Progress Notes (Signed)
Pt ambulating with walker under supervision. Denies pain all day. In good spirits. Looking forward to going home this coming Wednesday. 1x BM today. Pt states stools are loose. Peri area looking better since beginning Nystatin powder. Participating with therapy. Continue plan of care.

## 2011-06-14 NOTE — Progress Notes (Signed)
Per State Regulation 482.30 This chart was reviewed for medical necessity with respect to the patient's Admission/Duration of stay. Pt participating well in txs--looking at d/c home tomorrow after am txs w/ daughter in for family ed.  Pt will be d/c'd on O2 which will be new for her at home.  [name] Ina, PA & Dr. Riley Kill made aware of discussion of d/c tomorrow.   Brock Ra                 Nurse Care Manager              Next Review Date: none

## 2011-06-14 NOTE — Progress Notes (Signed)
D/C O2 for RA evaluation. Sats on recheck 30 minutes later = 82%

## 2011-06-15 NOTE — Patient Care Conference (Signed)
Inpatient RehabilitationTeam Conference Note Date: 06/15/2011   Time: 4:38 PM    Patient Name: [name]      Medical Record Number: [id number]  Date of Birth: March 07, 1922 Sex: Female         Room/Bed: 4007/4007-01 Payor Info: Payor: MEDICARE  Plan: MEDICARE PART A AND B  Product Type: *No Product type*     Admitting Diagnosis: resp failure,deconditioned  Admit Date/Time:  06/10/2011  2:49 PM Admission Comments: No comment available   Primary Diagnosis:  Physical deconditioning Principal Problem: Physical deconditioning  Patient Active Problem List  Diagnoses Date Noted  . Hypertension 06/10/2011  . Acute on chronic diastolic congestive heart failure 06/10/2011  . Physical deconditioning 06/10/2011  . Respiratory failure, acute   . Pneumonia 05/31/2011  . Hypertensive heart disease without CHF 05/28/2011  . History of pulmonary embolus (PE) 05/28/2011  . Chronic venous insufficiency 05/28/2011  . Gout 05/28/2011  . Asthma 05/28/2011  . History of GI diverticular bleed 05/28/2011  . Atrial fibrillation 05/28/2011  . Chronic kidney disease stage 3 05/28/2011  . Pulmonary hypertension 05/28/2011  . GERD  05/28/2011  . STROKE 07/31/2008    Expected Discharge Date: Expected Discharge Date: 06/15/11  Team Members Present: Physician: Dr. Faith Rogue Case Manager Present: Melanee Spry, RN Social Worker Present: [name], LCSW PT Present: Karolee Stamps, Judith Blonder, PTA OT Present: Edwin Cap, OT Other (Discipline and Name): Tora Duck, PPS Coordinator RN Present:  Daryll Brod    Current Status/Progress Goal Weekly Team Focus  Medical   Improving from a cardiorespiratory standpoint. Patient will need to go home on home oxygen.  Improved balance and exercise tolerance has been been achieved      Bowel/Bladder   Contient of bowel and bladder  Contient of bowel and bladder      Swallow/Nutrition/ Hydration   Regular Diet and thin liquids, Mod I  Mod I   D/C home today    ADL's   overall mod I, except supervision for transfers  overall mod I, except supervision for transfers  Goals Met   Mobility   modified independent with transfers and gait with RW, S stairs   modified independent with transfers and gait, S stairs  d/c planning, family ed   Communication             Safety/Cognition/ Behavioral Observations            Pain   Less than 3  Less than 3   [place]   Skin   Bruising to abdomen  No skin breakdown  Encourage to self turn q 2hrs to prevent skin breakdown      *See Interdisciplinary Assessment and Plan and progress notes for long and short-term goals  Barriers to Discharge: None    Possible Resolutions to Barriers:       Discharge Planning/Teaching Needs:    Home w/ assist of daughter.       Team Discussion:  Daughter in for education.  Pt will d/c on O2.  Pt ready for d/c  Revisions to Treatment Plan: none    Continued Need for Acute Rehabilitation Level of Care: The patient requires daily medical management by a physician with specialized training in physical medicine and rehabilitation for the following conditions: Daily direction of a multidisciplinary physical rehabilitation program to ensure safe treatment while eliciting the highest outcome that is of practical value to the patient.: Yes Daily medical management of patient stability for increased activity during participation in an intensive  rehabilitation regime.: Yes Daily analysis of laboratory values and/or radiology reports with any subsequent need for medication adjustment of medical intervention for : Pulmonary problems;Cardiac problems  Brock Ra 06/15/2011, 4:38 PM

## 2011-06-15 NOTE — Progress Notes (Signed)
Social Work  Discharge Note  The overall goal for the admission was met for:   Discharge location: Yes - home with family  Length of Stay: Yes - LOS 5 days  Discharge activity level: Yes - modified independent  Home/community participation: Yes  Services provided included: MD, RD, PT, OT, SLP, RN, CM, TR, Pharmacy and SW  Financial Services: Medicare  Follow-up services arranged: Home Health: RN and PT via [facility], DME: Home O2 via [facility] and Patient/Family has no preference for HH/DME agencies  Comments (or additional information):  Patient/Family verbalized understanding of follow-up arrangements: Yes  Individual responsible for coordination of the follow-up plan:  daughter  Confirmed correct DME delivered: [name] 06/15/2011    Jolayne Branson

## 2011-06-15 NOTE — Progress Notes (Signed)
Pt discharged home with family. Discharge instructions provided by PA. Pt verbalized understanding. Pt escorted off unit in W/C with personal belonging by Lelon Mast, NT

## 2011-06-15 NOTE — Progress Notes (Signed)
Late entry: Pt admitted to rehab and orientated to room. Discussed therapy schedule,  provided with education material, and shown safety video. Pt 's butterfly wish is to go home as soon as possible. Pt anticipating starting therapy in the a.m.

## 2011-06-15 NOTE — Progress Notes (Signed)
Subjective/Complaints: Had a good night.Denies shortness of breath with 2 liter of oxygen.Plan discharge home today after family teaching.ROS negative.  Objective: Vital Signs: Blood pressure 165/78, pulse 79, temperature 98.7 F (37.1 C), temperature source Oral, resp. rate 18, height 5\' 3"  (1.6 m), weight 69.2 kg (152 lb 8.9 oz), SpO2 98.00%. No results found. No results found for this basename: WBC:2,HGB:2,HCT:2,PLT:2 in the last 72 hours No results found for this basename: NA:2,K:2,CL:2,CO2:2,GLUCOSE:2,BUN:2,CREATININE:2,CALCIUM:2 in the last 72 hours CBG (last 3)  No results found for this basename: GLUCAP:3 in the last 72 hours  Wt Readings from Last 3 Encounters:  06/10/11 69.2 kg (152 lb 8.9 oz)  06/10/11 67.405 kg (148 lb 9.6 oz)  06/10/11 67.405 kg (148 lb 9.6 oz)    Physical Exam:  General appearance: alert  Head: Normocephalic, without obvious abnormality, atraumatic  Eyes: conjunctiva on right is red/irritated  Ears: normal TM's and external ear canals both ears  Nose: Nares normal. Septum midline. Mucosa normal. No drainage or sinus tenderness.  Throat: lips, mucosa, and tongue normal; teeth and gums normal  Neck: no adenopathy, no carotid bruit, no JVD, supple, symmetrical, trachea midline and thyroid not enlarged, symmetric, no tenderness/mass/nodules; N/c O2 in place  Back: negative, symmetric, no curvature. ROM normal. No CVA tenderness.  Resp: rales bibasilar  Cardio: regular rate and rhythm, S1, S2 normal, no murmur, click, rub or gallop; Resting HR 100  GI: soft, non-tender; bowel sounds normal; no masses, no organomegaly  Extremities: extremities normal, atraumatic, no cyanosis or edema  Pulses: 2+ and symmetric  Skin: Skin color, texture, turgor normal. No rashes or lesions  Neurologic: Grossly normal  Incision/Wound: not applicable   Assessment/Plan: 1. Functional deficits secondary to deconditioning/respiratory failure which require 3+ hours per day of  interdisciplinary therapy in a comprehensive inpatient rehab setting. Physiatrist is providing close team supervision and 24 hour management of active medical problems listed below. Physiatrist and rehab team continue to assess barriers to discharge/monitor patient progress toward functional and medical goals.  Rehab goals met.  D/C home today.  Mobility: Bed Mobility Bed Mobility: Yes Supine to Sit: 5: Supervision Sitting - Scoot to Edge of Bed: 5: Supervision Sit to Supine - Right: 5: Supervision Transfers Transfers: Yes Sit to Stand: 4: Min assist Stand to Sit: 4: Min assist Stand Pivot Transfers: 4: Min assist Ambulation/Gait Ambulation/Gait Assistance: 5: Supervision Ambulation Distance (Feet): 75 Feet Assistive device: Rolling walker Stairs: Yes Stairs Assistance: 4: Min assist Stair Management Technique: Two rails;With walker Number of Stairs: 5  Wheelchair Mobility Wheelchair Mobility: Yes (gait primary means of mobility) Wheelchair Assistance: 1: +1 Total assist Wheelchair Parts Management: Needs assistance ADL:   Cognition: Cognition Overall Cognitive Status: Appears within functional limits for tasks assessed Orientation Level: Oriented X4 Safety/Judgment: Appears intact Cognition Orientation Level: Oriented X4  1.Respiratory failure/pneumonia.plan home oxygen at 2 liter as sats dip to mid 80 s with exertion. 2. DVT Prophylaxis/Anticoagulation: ambulatory/S.Q heparin discontinued.  3. Pain Management:  4.CRI-strict I&O.CRT improved at 1.6 5.Htn/a-fib.amiodarone,cardura.No orthostasis.Follow up with PCP. 6.GERD-protonix 7.hyperlipidemia-pravachol 8.chronic anemia-iron supplement.HGB stable     ELOS (Days) 5   06/15/2011, 5:53 AM

## 2011-06-15 NOTE — Progress Notes (Signed)
Physical Therapy Discharge Summary & Treatment  Patient Details  Name: [name] MRN: [id number] Date of Birth: [birth year] Today's Date: 06/15/2011  Individual therapy [id number]. No c/o pain. Treatment session focused on family education with pt's daughter who will be providing intermittent supervision regarding car transfers, gait with RW and oxygen tubing management, balance, and home exercise program. Reviewed stairs with RW for home entry with S and up/down 12 steps for general strengthening and community mobility. [place] home exercise program provided for strength, balance, and fall prevention.  Patient has met 9 of 9 long term goals due to improved activity tolerance, strength, improved balance and improved coordination.  Patient to discharge at an ambulatory level Modified Independent.   Patient's care partner is independent to provide the necessary intermittent supervision assistance at discharge.  Recommendation:  Patient will benefit from ongoing skilled PT services in home health setting to continue to advance safe functional mobility, address ongoing impairments in gait, balance, activity tolerance, strength, and minimize fall risk.  Equipment: No equipment provided. Pt already owns RW.  Patient/family agrees with progress made and goals achieved: Yes  Karolee Stamps [facility] 06/15/2011, 11:59 AM

## 2011-06-15 NOTE — Progress Notes (Signed)
Occupational Therapy Discharge Summary  Patient Details  Name: [name] MRN: [id number] Date of Birth: 04-24-1922 Today's Date: 06/15/2011  Patient has met 6 of 6 long term goals due to improved activity tolerance.  Patient's care partner is independent to provide the necessary supervision assistance at discharge.    Recommendation:  Patient will continue to received OT services in a home health setting to continue to advance functional skills in the area of iADL.  Equipment: No equipment provided  Patient/family agrees with progress made and goals achieved: Yes  SAGUIER,JULIA 06/15/2011, 11:52 AM

## 2011-06-15 NOTE — Progress Notes (Signed)
Occupational Therapy Session Note  Patient Details  Name: [name] MRN: [id number] Date of Birth: February 14, 1922  Today's Date: 06/15/2011 Time: 9147-8295 Time Calculation (min): 41 min       Skilled Therapeutic Interventions/Progress Updates: Pt. Seen for ADL retraining with family education with patient's daughter with a focus on standing balance, functional mobility training, and activity tolerance.  Pt did well standing for 10 minutes at a time.  Overall pt was modified independent with all self care and supervision with mobility.  Pt's daughter was very comfortable providing supervision.  Pt is ready for discharge.        Vital Signs Oxygen Therapy SpO2: 96 % O2 Device: Nasal cannula O2 Flow Rate (L/min): 1.5 L/min Pulse Oximetry Type: Intermittent Pain Pain Assessment Pain Assessment: No/denies pain Pain Score: 0-No pain        Therapy/Group: Individual Therapy  [name] 06/15/2011, 11:42 AM

## 2011-06-21 ENCOUNTER — Encounter: Payer: Self-pay | Admitting: Cardiology

## 2011-06-21 DIAGNOSIS — I5032 Chronic diastolic (congestive) heart failure: Secondary | ICD-10-CM | POA: Insufficient documentation

## 2011-06-24 ENCOUNTER — Encounter (HOSPITAL_COMMUNITY): Payer: Self-pay

## 2011-06-28 LAB — FUNGUS CULTURE W SMEAR: Fungal Smear: NONE SEEN

## 2011-07-02 LAB — FUNGUS CULTURE W SMEAR: Fungal Smear: NONE SEEN

## 2011-07-14 LAB — AFB CULTURE WITH SMEAR (NOT AT ARMC)

## 2011-07-18 LAB — AFB CULTURE WITH SMEAR (NOT AT ARMC): Acid Fast Smear: NONE SEEN

## 2011-09-16 ENCOUNTER — Other Ambulatory Visit: Payer: Self-pay | Admitting: Cardiology

## 2012-01-10 ENCOUNTER — Other Ambulatory Visit: Payer: Self-pay | Admitting: Ophthalmology

## 2012-05-15 ENCOUNTER — Other Ambulatory Visit: Payer: Self-pay | Admitting: Cardiology

## 2012-05-15 ENCOUNTER — Ambulatory Visit
Admission: RE | Admit: 2012-05-15 | Discharge: 2012-05-15 | Disposition: A | Discharge status: Home / Self Care | Payer: Medicare Other | Source: Ambulatory Visit | Attending: Cardiology | Admitting: Cardiology

## 2012-05-15 DIAGNOSIS — I4891 Unspecified atrial fibrillation: Secondary | ICD-10-CM

## 2012-10-12 IMAGING — CR DG CHEST 1V PORT
1 series · 1 of 1 positions shown · non-contrast
Comparison: 06/04/2011

CLINICAL DATA: Status post thoracentesis.

PORTABLE CHEST - 1 VIEW

[view not recorded]
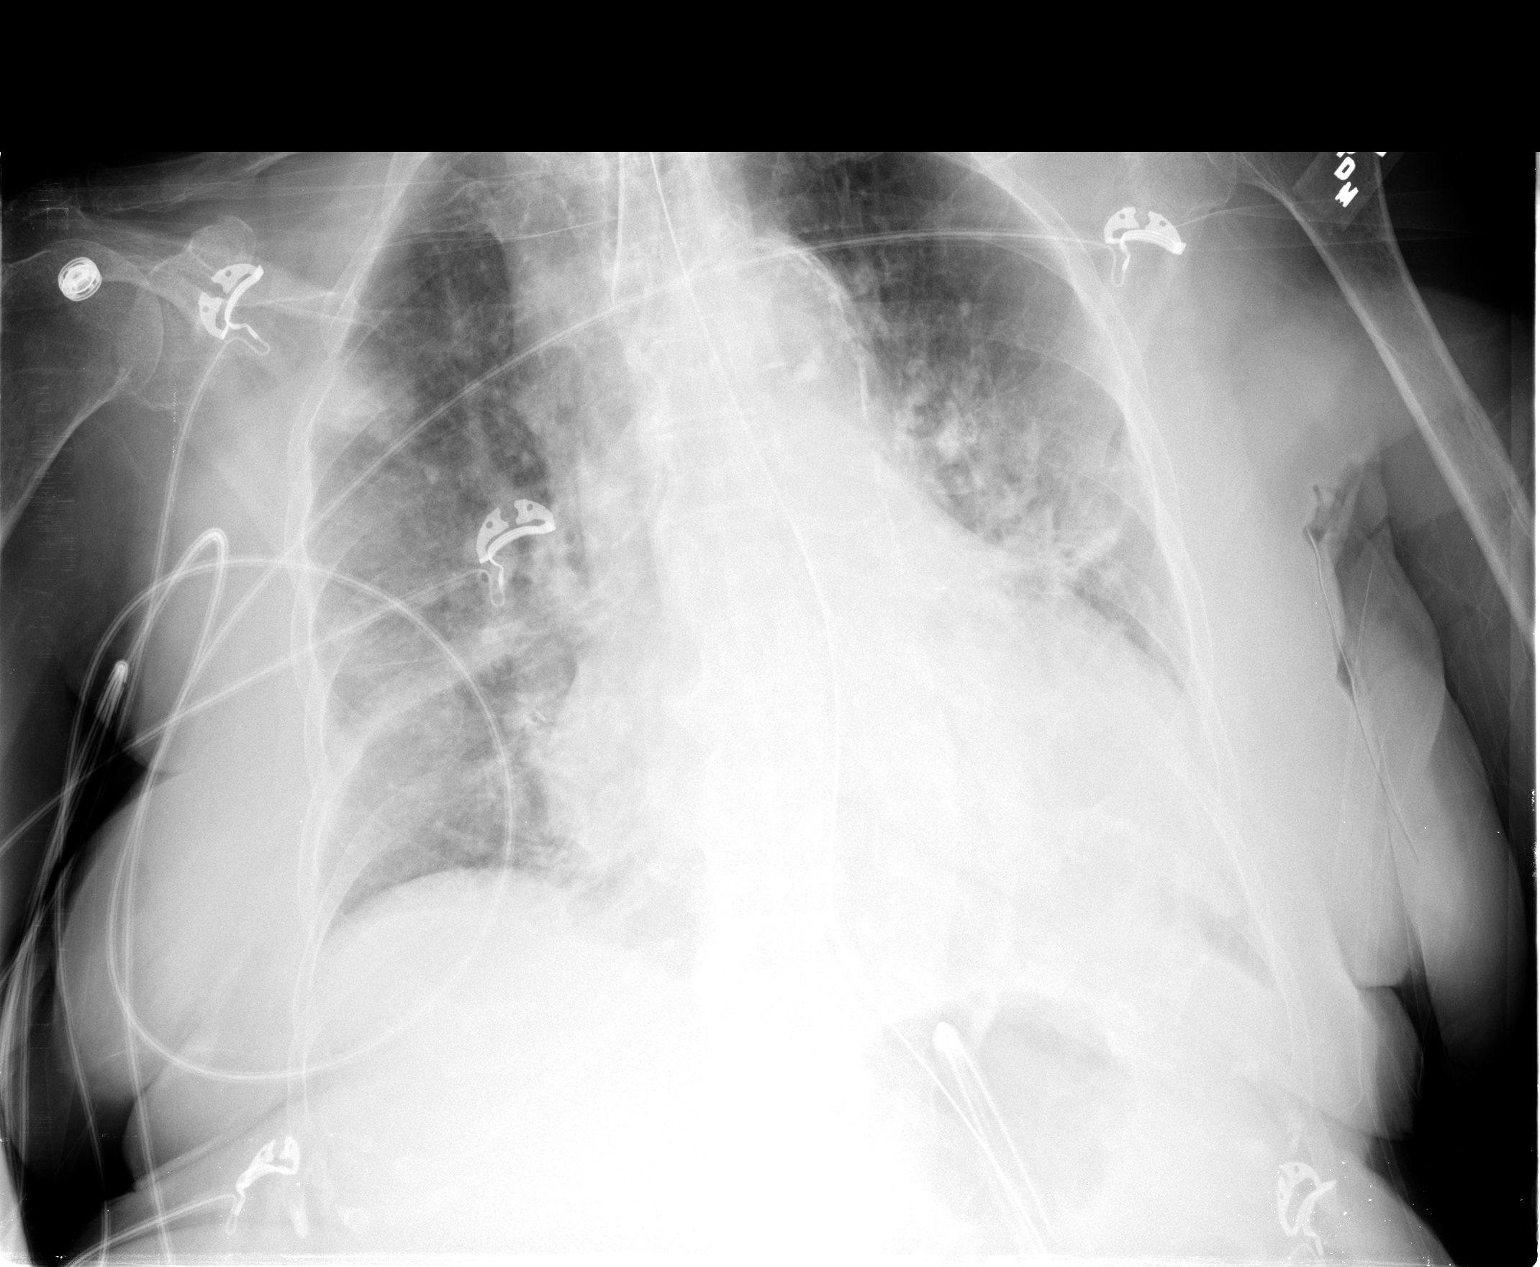

[1 of 1 positions shown; findings below may reference images not displayed]

FINDINGS: Endotracheal tube tip is 5.7 cm above the base the
carina.  NG tube is coiled in the stomach.  Distal tip of the NG
tube is not visualized. The cardiopericardial silhouette is
enlarged.  Marked improvement in left lung aeration.  There is some
persistent bibasilar atelectasis or infiltrate.  Focal nodular
opacities seen in the right upper lobe.
IMPRESSION: Interval interval decrease in left pleural effusion.  No evidence
for pneumothorax.

Peripheral nodular type density in the right lung is new in the
interval and is probably atelectatic.  Attention on follow-up
imaging is recommended.

## 2012-10-14 IMAGING — CR DG CHEST 1V PORT
1 series · 1 of 1 positions shown · non-contrast
Comparison: 06/05/2011

CLINICAL DATA: Check endotracheal tube

PORTABLE CHEST - 1 VIEW

[AP]
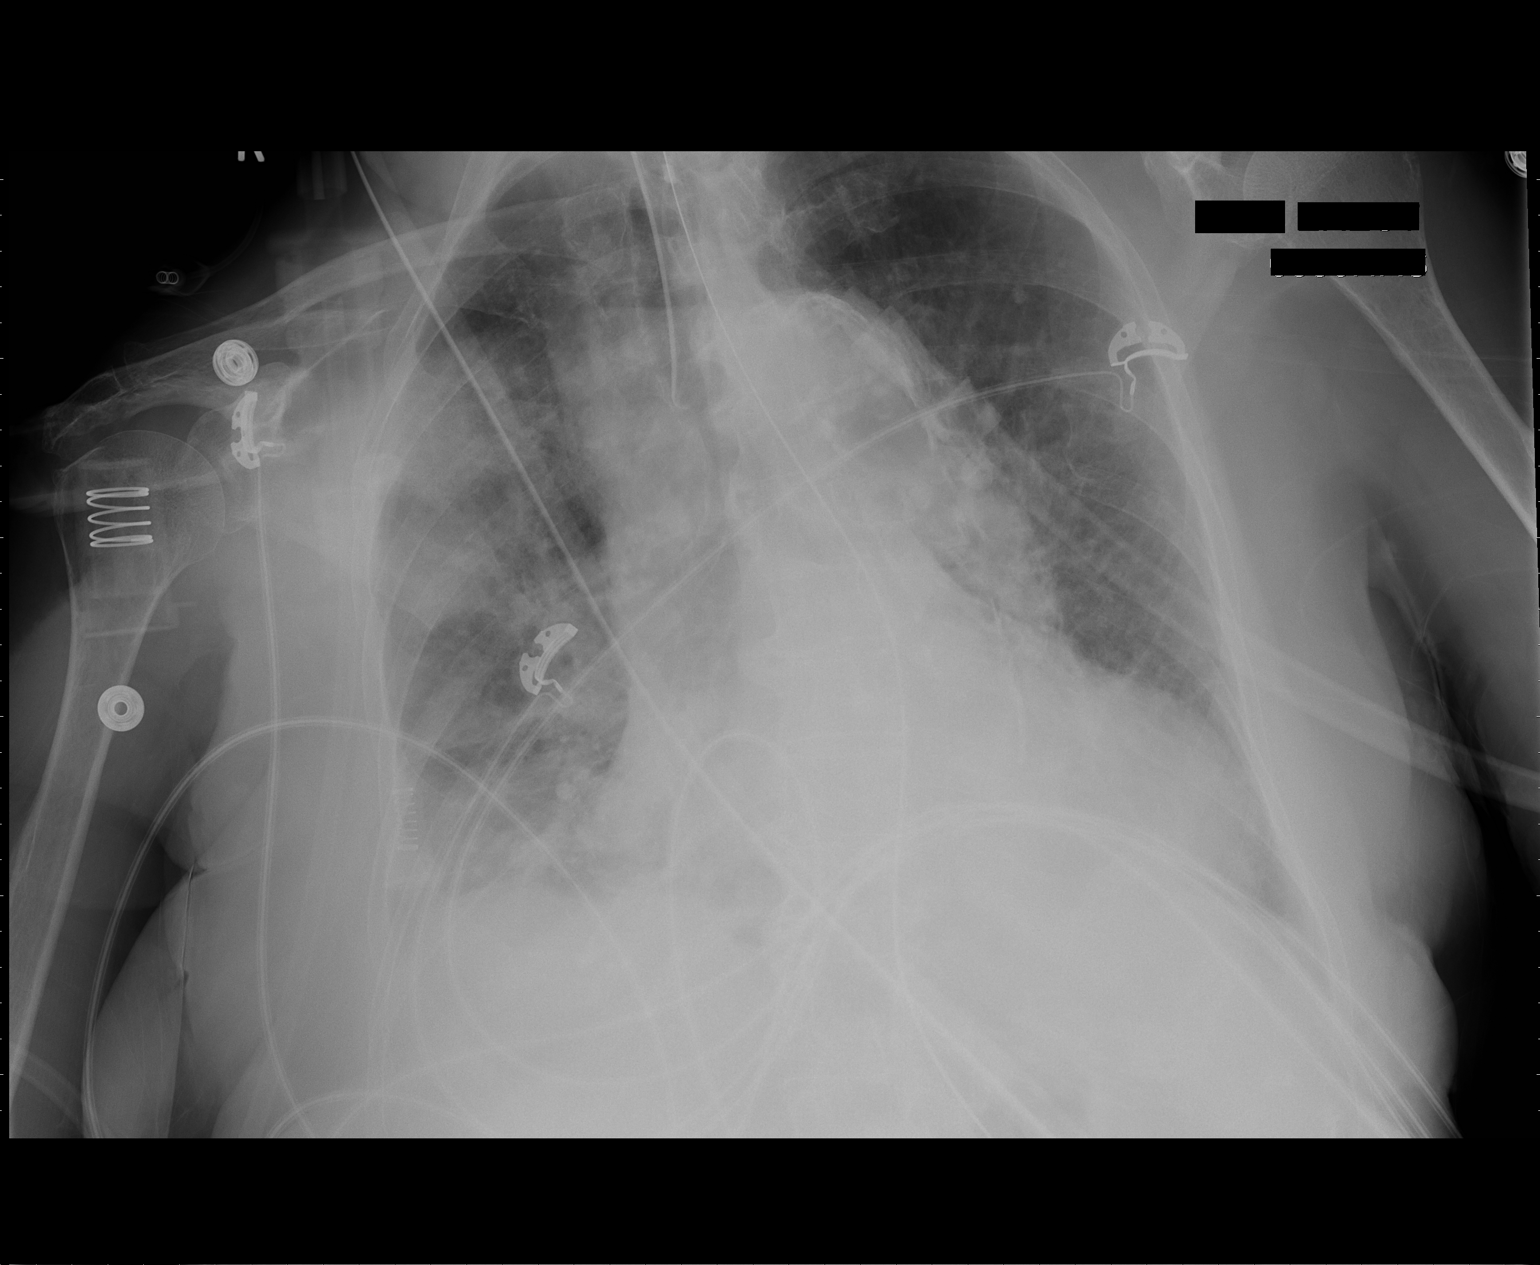

[1 of 1 positions shown; findings below may reference images not displayed]

FINDINGS: Endotracheal tube terminates 4.5 cm above the carina.

Enteric tube courses below the diaphragm.

Cardiomegaly with mild interstitial edema. Patchy right lung
opacities, possibly reflecting asymmetric edema versus developing
pneumonia.  Right basilar atelectasis with suspected right pleural
effusion.
IMPRESSION: Endotracheal tube terminates 4.5 cm above the carina.

Cardiomegaly with mild interstitial edema and suspected right
pleural effusion.

Patchy right lung opacities, possibly reflecting asymmetric edema
versus developing pneumonia.

## 2012-10-15 IMAGING — CR DG CHEST 1V PORT
1 series · 1 of 1 positions shown · non-contrast
Comparison: 06/06/2011

CLINICAL DATA: Check ET tube placement

PORTABLE CHEST - 1 VIEW

[AP]
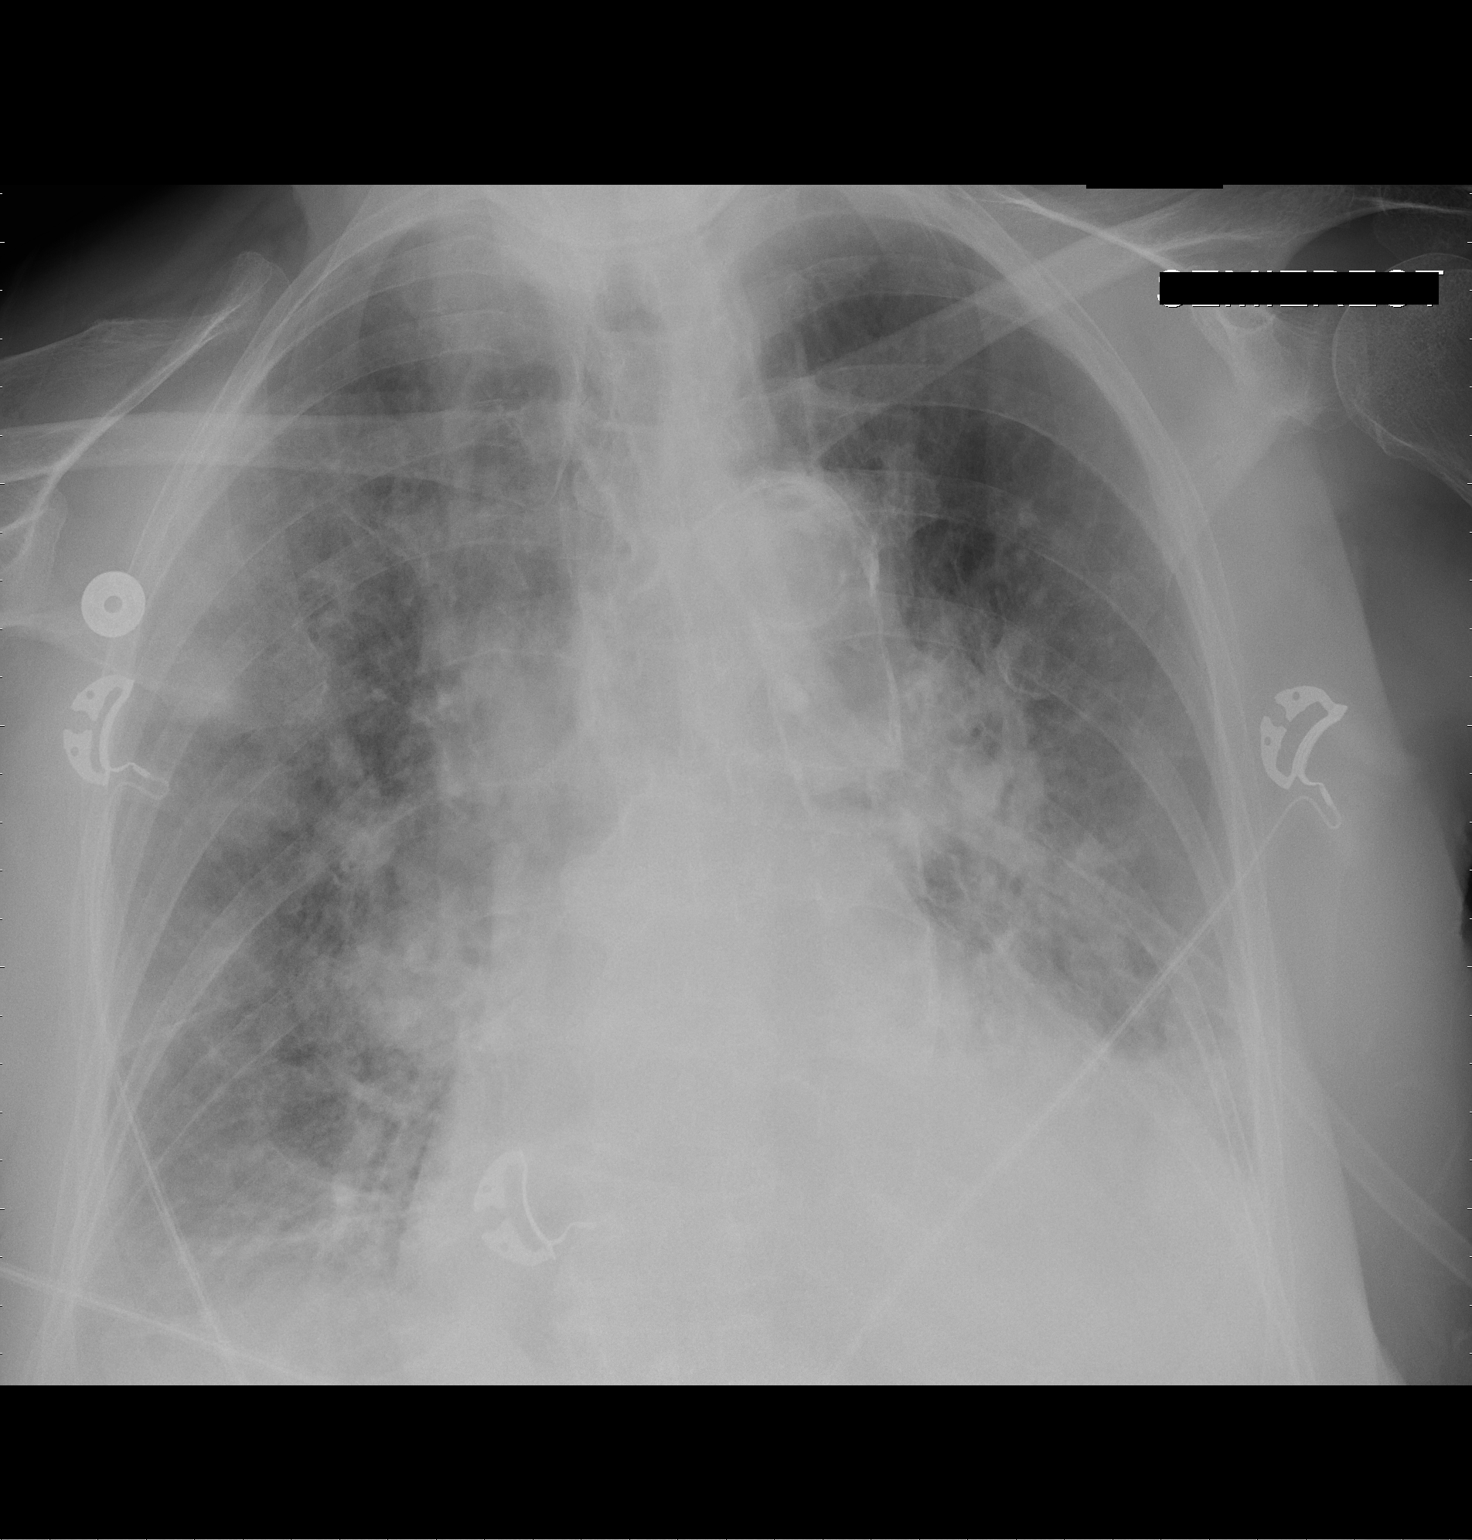

[1 of 1 positions shown; findings below may reference images not displayed]

FINDINGS: The endotracheal tube has been removed.  The nasogastric
tube has also been removed.

Heart size is moderately enlarged.

Bilateral pleural effusions and interstitial edema has increased
from previous exam.

Asymmetric airspace opacity within the right upper lobe is stable
from previous study.
IMPRESSION: 1.  Cardiac enlargement and heart failure appears increased from
previous exam.
2.  Status post extubation and removal of nasogastric tube.

## 2012-10-17 IMAGING — CR DG CHEST 2V
1 series · 1 of 1 positions shown · non-contrast
Comparison: Portable chest x-ray of 06/07/2011

CLINICAL DATA: Cough, history hypertension and asthma

CHEST - 2 VIEW

[w chest lat]
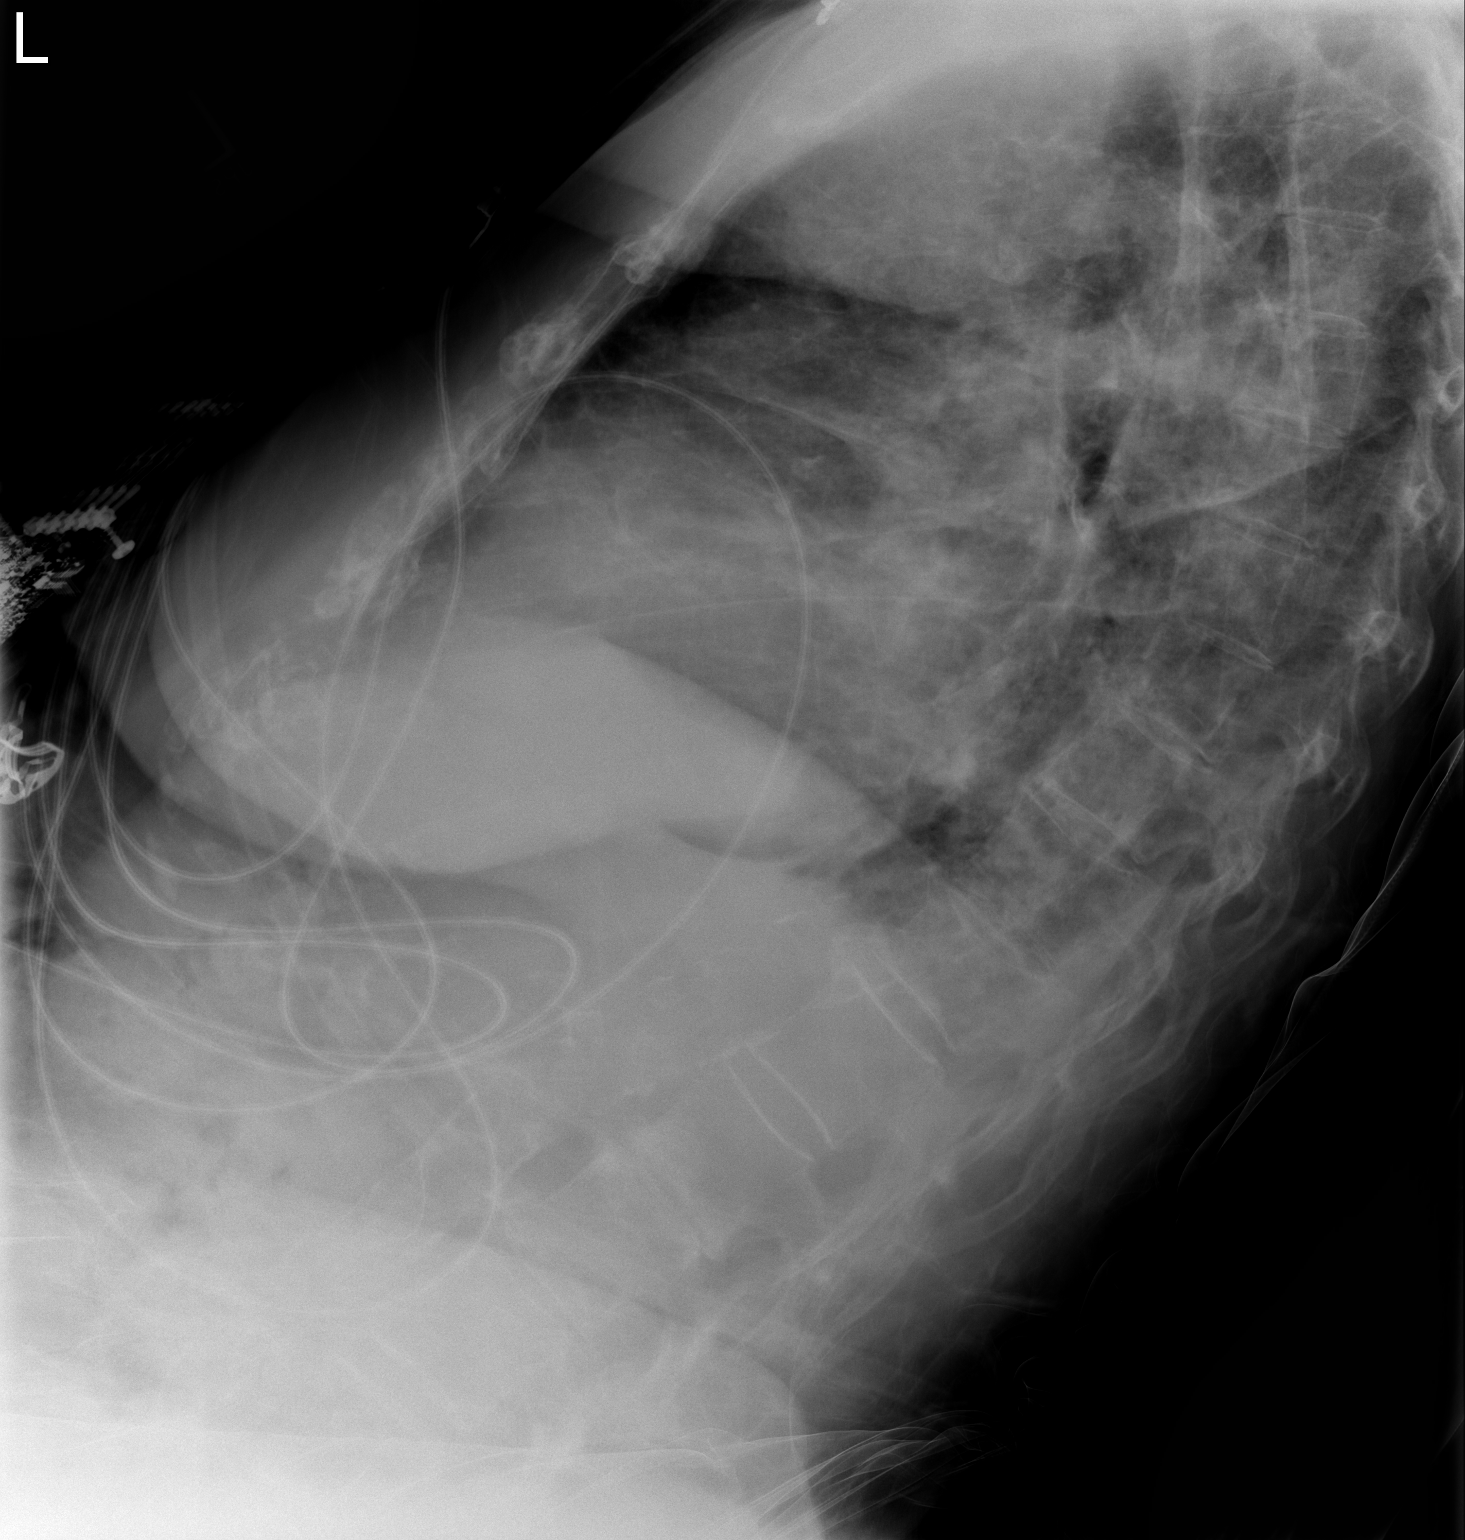

[1 of 1 positions shown; findings below may reference images not displayed]

FINDINGS: Aeration has improved slightly.  There also has been some
improvement in airspace disease although patchy opacities remain in
the right upper lung field and medially at the left lung base.
Cardiomegaly is stable.  No bony abnormality is seen.
IMPRESSION: Improved aeration with some improvement in patchy airspace disease.

## 2013-04-23 ENCOUNTER — Inpatient Hospital Stay (HOSPITAL_COMMUNITY)
Admission: EM | Admit: 2013-04-23 | Discharge: 2013-04-27 | DRG: 291 | Disposition: A | Discharge status: Home / Self Care | Payer: Medicare Other | Attending: Internal Medicine | Admitting: Internal Medicine

## 2013-04-23 ENCOUNTER — Emergency Department (HOSPITAL_COMMUNITY): Payer: Medicare Other

## 2013-04-23 ENCOUNTER — Encounter (HOSPITAL_COMMUNITY): Payer: Self-pay | Admitting: Emergency Medicine

## 2013-04-23 DIAGNOSIS — R059 Cough, unspecified: Secondary | ICD-10-CM | POA: Diagnosis present

## 2013-04-23 DIAGNOSIS — Z96649 Presence of unspecified artificial hip joint: Secondary | ICD-10-CM

## 2013-04-23 DIAGNOSIS — I272 Pulmonary hypertension, unspecified: Secondary | ICD-10-CM | POA: Diagnosis present

## 2013-04-23 DIAGNOSIS — Z8673 Personal history of transient ischemic attack (TIA), and cerebral infarction without residual deficits: Secondary | ICD-10-CM | POA: Diagnosis present

## 2013-04-23 DIAGNOSIS — I059 Rheumatic mitral valve disease, unspecified: Secondary | ICD-10-CM | POA: Diagnosis present

## 2013-04-23 DIAGNOSIS — M810 Age-related osteoporosis without current pathological fracture: Secondary | ICD-10-CM | POA: Diagnosis present

## 2013-04-23 DIAGNOSIS — N179 Acute kidney failure, unspecified: Secondary | ICD-10-CM | POA: Diagnosis present

## 2013-04-23 DIAGNOSIS — I6350 Cerebral infarction due to unspecified occlusion or stenosis of unspecified cerebral artery: Secondary | ICD-10-CM

## 2013-04-23 DIAGNOSIS — I119 Hypertensive heart disease without heart failure: Secondary | ICD-10-CM | POA: Diagnosis present

## 2013-04-23 DIAGNOSIS — Z8719 Personal history of other diseases of the digestive system: Secondary | ICD-10-CM

## 2013-04-23 DIAGNOSIS — J45909 Unspecified asthma, uncomplicated: Secondary | ICD-10-CM | POA: Diagnosis present

## 2013-04-23 DIAGNOSIS — I872 Venous insufficiency (chronic) (peripheral): Secondary | ICD-10-CM

## 2013-04-23 DIAGNOSIS — E785 Hyperlipidemia, unspecified: Secondary | ICD-10-CM | POA: Diagnosis present

## 2013-04-23 DIAGNOSIS — I498 Other specified cardiac arrhythmias: Secondary | ICD-10-CM

## 2013-04-23 DIAGNOSIS — M109 Gout, unspecified: Secondary | ICD-10-CM | POA: Diagnosis present

## 2013-04-23 DIAGNOSIS — R7309 Other abnormal glucose: Secondary | ICD-10-CM | POA: Diagnosis present

## 2013-04-23 DIAGNOSIS — Z66 Do not resuscitate: Secondary | ICD-10-CM | POA: Diagnosis present

## 2013-04-23 DIAGNOSIS — R001 Bradycardia, unspecified: Secondary | ICD-10-CM | POA: Diagnosis present

## 2013-04-23 DIAGNOSIS — K219 Gastro-esophageal reflux disease without esophagitis: Secondary | ICD-10-CM | POA: Diagnosis present

## 2013-04-23 DIAGNOSIS — N2581 Secondary hyperparathyroidism of renal origin: Secondary | ICD-10-CM | POA: Diagnosis present

## 2013-04-23 DIAGNOSIS — D638 Anemia in other chronic diseases classified elsewhere: Secondary | ICD-10-CM | POA: Diagnosis present

## 2013-04-23 DIAGNOSIS — Z86711 Personal history of pulmonary embolism: Secondary | ICD-10-CM

## 2013-04-23 DIAGNOSIS — I4891 Unspecified atrial fibrillation: Secondary | ICD-10-CM | POA: Diagnosis present

## 2013-04-23 DIAGNOSIS — I5033 Acute on chronic diastolic (congestive) heart failure: Secondary | ICD-10-CM | POA: Diagnosis present

## 2013-04-23 DIAGNOSIS — R05 Cough: Secondary | ICD-10-CM

## 2013-04-23 DIAGNOSIS — I509 Heart failure, unspecified: Secondary | ICD-10-CM | POA: Diagnosis present

## 2013-04-23 DIAGNOSIS — I2789 Other specified pulmonary heart diseases: Secondary | ICD-10-CM | POA: Diagnosis present

## 2013-04-23 DIAGNOSIS — I1310 Hypertensive heart and chronic kidney disease without heart failure, with stage 1 through stage 4 chronic kidney disease, or unspecified chronic kidney disease: Secondary | ICD-10-CM

## 2013-04-23 DIAGNOSIS — I130 Hypertensive heart and chronic kidney disease with heart failure and stage 1 through stage 4 chronic kidney disease, or unspecified chronic kidney disease: Principal | ICD-10-CM | POA: Diagnosis present

## 2013-04-23 DIAGNOSIS — N183 Chronic kidney disease, stage 3 unspecified: Secondary | ICD-10-CM | POA: Diagnosis present

## 2013-04-23 DIAGNOSIS — I5032 Chronic diastolic (congestive) heart failure: Secondary | ICD-10-CM | POA: Diagnosis present

## 2013-04-23 HISTORY — DX: Unspecified osteoarthritis, unspecified site: M19.90

## 2013-04-23 HISTORY — DX: Essential (primary) hypertension: I10

## 2013-04-23 HISTORY — DX: Pneumonia, unspecified organism: J18.9

## 2013-04-23 LAB — PRO B NATRIURETIC PEPTIDE: Pro B Natriuretic peptide (BNP): 6275 pg/mL — ABNORMAL HIGH (ref 0–450)

## 2013-04-23 LAB — BASIC METABOLIC PANEL
CO2: 30 mEq/L (ref 19–32)
Calcium: 9 mg/dL (ref 8.4–10.5)
Creatinine, Ser: 1.95 mg/dL — ABNORMAL HIGH (ref 0.50–1.10)
Glucose, Bld: 136 mg/dL — ABNORMAL HIGH (ref 70–99)

## 2013-04-23 LAB — CBC
MCH: 31.6 pg (ref 26.0–34.0)
MCV: 94.4 fL (ref 78.0–100.0)
Platelets: 155 10*3/uL (ref 150–400)
RBC: 3.92 MIL/uL (ref 3.87–5.11)

## 2013-04-23 MED ORDER — COLCHICINE 0.6 MG PO TABS
0.6000 mg | ORAL_TABLET | Freq: Every day | ORAL | Status: DC | PRN
  Filled 2013-04-23: qty 1

## 2013-04-23 MED ORDER — ENOXAPARIN SODIUM 40 MG/0.4ML ~~LOC~~ SOLN
40.0000 mg | SUBCUTANEOUS | Status: DC
  Administered 2013-04-23: 23:00:00 40 mg via SUBCUTANEOUS
  Filled 2013-04-23 (×2): qty 0.4

## 2013-04-23 MED ORDER — BUMETANIDE 2 MG PO TABS
2.0000 mg | ORAL_TABLET | Freq: Every day | ORAL | Status: DC
  Filled 2013-04-23: qty 1

## 2013-04-23 MED ORDER — SODIUM CHLORIDE 0.9 % IJ SOLN
3.0000 mL | Freq: Two times a day (BID) | INTRAMUSCULAR | Status: DC
  Administered 2013-04-23 – 2013-04-25 (×4): 3 mL via INTRAVENOUS

## 2013-04-23 MED ORDER — METOLAZONE 5 MG PO TABS
5.0000 mg | ORAL_TABLET | Freq: Once | ORAL | Status: AC
  Administered 2013-04-23: 5 mg via ORAL
  Filled 2013-04-23: qty 1

## 2013-04-23 MED ORDER — FERROUS SULFATE 325 (65 FE) MG PO TABS
325.0000 mg | ORAL_TABLET | Freq: Every day | ORAL | Status: DC
  Administered 2013-04-23 – 2013-04-26 (×4): 325 mg via ORAL
  Filled 2013-04-23 (×5): qty 1

## 2013-04-23 MED ORDER — FUROSEMIDE 10 MG/ML IJ SOLN
20.0000 mg | Freq: Once | INTRAMUSCULAR | Status: AC
  Administered 2013-04-23: 20 mg via INTRAVENOUS
  Filled 2013-04-23: qty 2

## 2013-04-23 MED ORDER — TRAMADOL HCL 50 MG PO TABS: 50.0000 mg | ORAL_TABLET | Freq: Four times a day (QID) | ORAL | Status: DC | PRN

## 2013-04-23 MED ORDER — SODIUM CHLORIDE 0.9 % IJ SOLN: 3.0000 mL | INTRAMUSCULAR | Status: DC | PRN

## 2013-04-23 MED ORDER — BUDESONIDE-FORMOTEROL FUMARATE 80-4.5 MCG/ACT IN AERO
2.0000 | INHALATION_SPRAY | Freq: Two times a day (BID) | RESPIRATORY_TRACT | Status: DC
  Administered 2013-04-23 – 2013-04-27 (×8): 2 via RESPIRATORY_TRACT
  Filled 2013-04-23: qty 6.9

## 2013-04-23 MED ORDER — SODIUM CHLORIDE 0.9 % IV SOLN: 250.0000 mL | INTRAVENOUS | Status: DC | PRN

## 2013-04-23 MED ORDER — IPRATROPIUM BROMIDE 0.02 % IN SOLN
0.5000 mg | Freq: Three times a day (TID) | RESPIRATORY_TRACT | Status: DC
  Administered 2013-04-24 – 2013-04-26 (×9): 0.5 mg via RESPIRATORY_TRACT
  Filled 2013-04-23 (×9): qty 2.5

## 2013-04-23 MED ORDER — LEVALBUTEROL HCL 1.25 MG/0.5ML IN NEBU
1.2500 mg | INHALATION_SOLUTION | Freq: Three times a day (TID) | RESPIRATORY_TRACT | Status: DC
  Administered 2013-04-24 – 2013-04-26 (×9): 1.25 mg via RESPIRATORY_TRACT
  Filled 2013-04-23 (×10): qty 0.5

## 2013-04-23 MED ORDER — DOXAZOSIN MESYLATE 4 MG PO TABS
4.0000 mg | ORAL_TABLET | Freq: Every day | ORAL | Status: DC
  Administered 2013-04-23 – 2013-04-26 (×4): 4 mg via ORAL
  Filled 2013-04-23 (×5): qty 1

## 2013-04-23 MED ORDER — SIMVASTATIN 10 MG PO TABS
10.0000 mg | ORAL_TABLET | Freq: Every day | ORAL | Status: DC
  Administered 2013-04-24 – 2013-04-26 (×3): 10 mg via ORAL
  Filled 2013-04-23 (×4): qty 1

## 2013-04-23 MED ORDER — MONTELUKAST SODIUM 10 MG PO TABS
10.0000 mg | ORAL_TABLET | Freq: Every day | ORAL | Status: DC
  Administered 2013-04-23 – 2013-04-26 (×4): 10 mg via ORAL
  Filled 2013-04-23 (×5): qty 1

## 2013-04-23 MED ORDER — DILTIAZEM HCL ER COATED BEADS 180 MG PO CP24: 180.0000 mg | ORAL_CAPSULE | Freq: Every day | ORAL | Status: DC

## 2013-04-23 MED ORDER — ONDANSETRON HCL 4 MG/2ML IJ SOLN: 4.0000 mg | Freq: Four times a day (QID) | INTRAMUSCULAR | Status: DC | PRN

## 2013-04-23 MED ORDER — ASPIRIN EC 81 MG PO TBEC
81.0000 mg | DELAYED_RELEASE_TABLET | Freq: Every day | ORAL | Status: DC
  Administered 2013-04-24 – 2013-04-27 (×4): 81 mg via ORAL
  Filled 2013-04-23 (×4): qty 1

## 2013-04-23 MED ORDER — IPRATROPIUM BROMIDE 0.02 % IN SOLN: 0.5000 mg | Freq: Four times a day (QID) | RESPIRATORY_TRACT | Status: DC

## 2013-04-23 MED ORDER — FUROSEMIDE 10 MG/ML IJ SOLN
40.0000 mg | Freq: Every day | INTRAMUSCULAR | Status: DC
  Administered 2013-04-23: 23:00:00 40 mg via INTRAVENOUS
  Filled 2013-04-23 (×2): qty 4

## 2013-04-23 MED ORDER — PANTOPRAZOLE SODIUM 40 MG PO TBEC
40.0000 mg | DELAYED_RELEASE_TABLET | Freq: Every day | ORAL | Status: DC
  Administered 2013-04-24 – 2013-04-27 (×4): 40 mg via ORAL
  Filled 2013-04-23 (×4): qty 1

## 2013-04-23 MED ORDER — BUMETANIDE 2 MG PO TABS
2.0000 mg | ORAL_TABLET | Freq: Every day | ORAL | Status: DC
  Administered 2013-04-24 – 2013-04-25 (×2): 2 mg via ORAL
  Filled 2013-04-23 (×2): qty 1

## 2013-04-23 MED ORDER — ACETAMINOPHEN 325 MG PO TABS
650.0000 mg | ORAL_TABLET | ORAL | Status: DC | PRN
  Administered 2013-04-24 – 2013-04-26 (×3): 650 mg via ORAL
  Filled 2013-04-23 (×3): qty 2

## 2013-04-23 MED ORDER — VITAMIN D3 25 MCG (1000 UNIT) PO TABS
1000.0000 [IU] | ORAL_TABLET | Freq: Every day | ORAL | Status: DC
  Administered 2013-04-24 – 2013-04-27 (×4): 1000 [IU] via ORAL
  Filled 2013-04-23 (×4): qty 1

## 2013-04-23 MED ORDER — FLUTICASONE PROPIONATE 50 MCG/ACT NA SUSP
2.0000 | Freq: Every day | NASAL | Status: DC
  Administered 2013-04-24 – 2013-04-27 (×4): 2 via NASAL
  Filled 2013-04-23: qty 16

## 2013-04-23 MED ORDER — PROPRANOLOL HCL 20 MG PO TABS
20.0000 mg | ORAL_TABLET | Freq: Two times a day (BID) | ORAL | Status: DC
Start: 2013-04-23 — End: 2013-04-23
  Filled 2013-04-23: qty 1

## 2013-04-23 MED ORDER — AMIODARONE HCL 200 MG PO TABS
200.0000 mg | ORAL_TABLET | ORAL | Status: DC
  Administered 2013-04-25 – 2013-04-27 (×2): 200 mg via ORAL
  Filled 2013-04-23 (×2): qty 1

## 2013-04-23 NOTE — H&P (Addendum)
[facility] History and Physical  MARJON DOXTATER ZOX:[id number] DOB: Oct 01, 1921 DOA: 04/23/2013  Referring physician: PCP: No primary provider on file.  Specialists:   Chief Complaint: Cough/dyspnea x1 week   HPI: Olimpia A Dutkiewicz is a 79 y.o. female PMHx  diastolic CHF, HTN, A. fib, asthma, prior PE,  CVA, history GI diverticular bleed, CKD stage III. Presented with productive cough (yellow) and dyspnea x1 week, associated with generalized malaise. She denies fever, gi symptoms, or pain anywhere, and her symptoms were initially relieved by albuterol, however due to persistent productive cough which now has turned clear she was seen by her PCP today, found to have a HR in low 40s, BP stable, and hypoxic to 89%. She has had home O2 in the past due to CHF exacerbation, however has not recently needed o2. On arrival here from PCP, pt with improvement in symptoms after albuterol, EKG repeated and consistent with prior as above.  Currently patient still having productive cough SOB at rest without 02.    Review of Systems: The patient denies anorexia, fever, weight loss,, vision loss, decreased hearing, hoarseness, chest pain, syncope, peripheral edema, balance deficits, hemoptysis, abdominal pain, melena, hematochezia, severe indigestion/heartburn, hematuria, incontinence, genital sores, muscle weakness, suspicious skin lesions, transient blindness, difficulty walking, depression, unusual weight change, abnormal bleeding, enlarged lymph nodes, angioedema, and breast masses.   Procedure CXR 04/23/2013 Moderate cardiac enlargement. Pulmonary vascular congestion is  noted. No edema or effusion. There is no airspace consolidation  identified. Spondylosis is identified within the thoracic spine NOTE; (my read).  To be a possible opacification upper left lower lobe/lingula    Past Medical History  Diagnosis Date  . Acute on chronic diastolic congestive heart failure 05/28/2011  .  Hypertensive heart disease without CHF 05/28/2011  . History of pulmonary embolus (PE) 05/28/2011    2006 following pelvic fracture  . Chronic venous insufficiency 05/28/2011  . Hyperlipidemia 05/28/2011  . Gout 05/28/2011  . Asthma 05/28/2011  . History of GI diverticular bleed 05/28/2011    June 2012  . Atrial fibrillation 05/28/2011  . Chronic kidney disease stage 3 05/28/2011  . Kidney stones   . Osteoporosis   . Pulmonary hypertension 05/28/2011  . GERD (gastroesophageal reflux disease) 05/28/2011  . Mitral regurgitation 05/31/2011  . Secondary hyperparathyroidism (of renal origin)   . Anemia of chronic disease   . History of CVA (cerebrovascular accident)   . Chronic diastolic congestive heart failure    Past Surgical History  Procedure Laterality Date  . Partial colectomy      1999 following colonoscopy complication  . Left hip replacement      2004, complicated with dislocation  . Right hip replacement      2005  . Hernia repair    . Bilateral cataract surgery     Social History:  reports that she has never smoked. She has never used smokeless tobacco. She reports that she does not drink alcohol. Her drug history is not on file.     Allergies  Allergen Reactions  . Lisinopril     REACTION: angioedema  . Primidone Other (See Comments)    No reaction noted  . Warfarin And Related     "GI BLEED"  . Clonidine Derivatives Rash    History reviewed. No pertinent family history.   Prior to Admission medications   Medication Sig Start Date End Date Taking? Authorizing Provider  amiodarone (PACERONE) 200 MG tablet Take 200 mg by mouth 3 (three) times a week. Take  one tablet on Monday, Wednesday, and Friday    Yes Historical Provider, MD  aspirin EC 81 MG tablet Take 81 mg by mouth daily.   Yes Historical Provider, MD  budesonide-formoterol (SYMBICORT) 80-4.5 MCG/ACT inhaler Inhale 2 puffs into the lungs 2 (two) times daily.    Yes Historical Provider, MD  bumetanide (BUMEX) 2  MG tablet Take 2 mg by mouth daily.     Yes Historical Provider, MD  chlorpheniramine-HYDROcodone (TUSSIONEX) 10-8 MG/5ML LQCR Take 5 mLs by mouth every 12 (twelve) hours as needed (for cough).  04/17/13  Yes Historical Provider, MD  cholecalciferol (VITAMIN D) 1000 UNITS tablet Take 1,000 Units by mouth daily.     Yes Historical Provider, MD  colchicine 0.6 MG tablet Take 0.6 mg by mouth daily as needed (for gout).    Yes Historical Provider, MD  diltiazem (CARDIZEM CD) 180 MG 24 hr capsule Take 180 mg by mouth daily.     Yes Historical Provider, MD  doxazosin (CARDURA) 4 MG tablet Take 4 mg by mouth at bedtime.     Yes Historical Provider, MD  ferrous sulfate 325 (65 FE) MG tablet Take 325 mg by mouth at bedtime.    Yes Historical Provider, MD  fluticasone (FLONASE) 50 MCG/ACT nasal spray Place 2 sprays into the nose daily.   Yes Historical Provider, MD  montelukast (SINGULAIR) 10 MG tablet Take 10 mg by mouth at bedtime. For shortness of breath   Yes Historical Provider, MD  Multiple Vitamins-Minerals (MULTIVITAMINS THER. W/MINERALS) TABS Take 1 tablet by mouth daily.     Yes Historical Provider, MD  omeprazole (PRILOSEC) 20 MG capsule Take 20 mg by mouth daily.     Yes Historical Provider, MD  pravastatin (PRAVACHOL) 20 MG tablet Take 20 mg by mouth every evening.     Yes Historical Provider, MD  propranolol (INDERAL) 20 MG tablet Take 20 mg by mouth 2 (two) times daily.  04/17/13  Yes Historical Provider, MD  sodium chloride (OCEAN) 0.65 % nasal spray Place 1 spray into the nose as needed for congestion.   Yes Historical Provider, MD  traMADol (ULTRAM) 50 MG tablet Take 50 mg by mouth every 8 (eight) hours as needed for pain. For muscle spasms. Maximum dose= 8 tablets per day   Yes Historical Provider, MD  moxifloxacin (AVELOX) 400 MG tablet Take 400 mg by mouth daily. Start 9.23.14 end 9.29.14 04/17/13   Historical Provider, MD   Physical Exam: Filed Vitals:   04/23/13 1510 04/23/13 1530  04/23/13 1700 04/23/13 1715  BP:  136/58 154/64 158/57  Pulse:  48 48 47  Temp:      TempSrc:      Resp:  14 17 14   SpO2: 97% 97% 94% 95%     General: A./O. X4, mild respiratory distress without 02   Eyes: Pupils equal round react to light and accomidation  Neck: Negative JVD  Cardiovascular: Irregular rhythm and rate, negative murmurs rubs or gallops, DP/PT pulses 1+ bilateral  Respiratory: Positive diffuse rales,   Abdomen: Soft nontender nondistended plus bowel sounds  Musculoskeletal: Bilateral pedal edema in the 2-3+  Neurologic: Cranial nerves II through XII intact  Labs on Admission:  Basic Metabolic Panel:  Recent Labs Lab 04/23/13 1715  NA 141  K 4.0  CL 99  CO2 30  GLUCOSE 136*  BUN 40*  CREATININE 1.95*  CALCIUM 9.0   Liver Function Tests: No results found for this basename: AST, ALT, ALKPHOS, BILITOT, PROT, ALBUMIN,  in  the last 168 hours No results found for this basename: LIPASE, AMYLASE,  in the last 168 hours No results found for this basename: AMMONIA,  in the last 168 hours CBC:  Recent Labs Lab 04/23/13 1715  WBC 7.2  HGB 12.4  HCT 37.0  MCV 94.4  PLT 155   Cardiac Enzymes: No results found for this basename: CKTOTAL, CKMB, CKMBINDEX, TROPONINI,  in the last 168 hours  BNP (last 3 results)  Recent Labs  04/23/13 1715  PROBNP 6275.0*   CBG: No results found for this basename: GLUCAP,  in the last 168 hours  Radiological Exams on Admission: Dg Chest 2 View  04/23/2013   CLINICAL DATA:  Shortness of breath and cough  EXAM: CHEST  2 VIEW  COMPARISON:  05/15/2012  FINDINGS: Moderate cardiac enlargement. Pulmonary vascular congestion is noted. No edema or effusion. There is no airspace consolidation identified. Spondylosis is identified within the thoracic spine.  IMPRESSION: Cardiac enlargement and pulmonary venous congestion.   Electronically Signed   By: Signa Kell M.D.   On: 04/23/2013 16:34    1. EKG: sinus bradycardia  arrhythmia vs 3rd degree AV block NOTE; phone consult with Dr.Whitlock (cardiology fellow) we discussed EKG and he was continent WAS NOT third degree AV block   Assessment/Plan Active Problems:   * No active hospital problems. *   1. Diastolic CHF; proBNP 6275 which is the second highest it has been 2 years -Lasix 40 mg daily -Zaroxolyn 5 mg one time dose with first administration of Lasix -Strict I and O. -Daily  AM weight -Echocardiogram   2. Bradycardia; hold beta blocker secondary to bradycardia and borderline blood pressure -We'll also hold the diltiazem after speaking with Dr.Whitlock (cardiology fellow) -Contact Dr. Donnie Aho (independent cardiologist) in the a.m. to see the patient   3. Asthma; continue home meds -Ipratropium nebulizer every 6 hours   4. Atrial fibrillation; not in A. fib currently by exam patient in a sinus bradycardia arrhythmia see EKG  5. Pulmonary hypertension; will have to gently diuresis patient with high pulmonary hypertension will require preload  6. Pneumonia?;  Would have a low threshold for starting patient on antibiotics; possible opacification upper left lower lobe/lingula    Code Status:? Family Communication:  Disposition Plan:   Time spent: 60 minutes  Deja Pisarski, Roselind Messier [facility] Pager [phone]  If 7PM-7AM, please contact night-coverage [email or web address] Password [email or web address] 04/23/2013, 6:26 PM

## 2013-04-23 NOTE — ED Provider Notes (Signed)
CSN: [id number]     Arrival date & time 04/23/13  1450 History   First MD Initiated Contact with Patient 04/23/13 1529     Chief Complaint  Patient presents with  . Shortness of Breath  . Bradycardia   (Consider location/radiation/quality/duration/timing/severity/associated sxs/prior Treatment) Patient is a 79 y.o. female presenting with shortness of breath.  Shortness of Breath Severity:  Moderate Onset quality:  Gradual Duration:  1 week Timing:  Constant Progression:  Worsening Chronicity:  Recurrent Relieved by:  Inhaler Worsened by:  Nothing tried Ineffective treatments:  None tried Associated symptoms: cough (productive), sputum production and wheezing   Associated symptoms: no abdominal pain, no chest pain, no fever, no rash, no sore throat and no vomiting   Risk factors comment:  HO CHF   Past Medical History  Diagnosis Date  . Acute on chronic diastolic congestive heart failure 05/28/2011  . Hypertensive heart disease without CHF 05/28/2011  . History of pulmonary embolus (PE) 05/28/2011    2006 following pelvic fracture  . Chronic venous insufficiency 05/28/2011  . Hyperlipidemia 05/28/2011  . Gout 05/28/2011  . Asthma 05/28/2011  . History of GI diverticular bleed 05/28/2011    June 2012  . Atrial fibrillation 05/28/2011  . Chronic kidney disease stage 3 05/28/2011  . Kidney stones   . Osteoporosis   . Pulmonary hypertension 05/28/2011  . GERD (gastroesophageal reflux disease) 05/28/2011  . Mitral regurgitation 05/31/2011  . Secondary hyperparathyroidism (of renal origin)   . Anemia of chronic disease   . History of CVA (cerebrovascular accident)   . Chronic diastolic congestive heart failure   . Hypertension   . Pneumonia   . Arthritis    Past Surgical History  Procedure Laterality Date  . Partial colectomy      1999 following colonoscopy complication  . Left hip replacement      2004, complicated with dislocation  . Right hip replacement      2005  . Hernia  repair    . Bilateral cataract surgery    . Colon surgery     History reviewed. No pertinent family history. History  Substance Use Topics  . Smoking status: Never Smoker   . Smokeless tobacco: Never Used  . Alcohol Use: No   OB History   Grav Para Term Preterm Abortions TAB SAB Ect Mult Living                 Review of Systems  Constitutional: Negative for fever and chills.  HENT: Negative for congestion, sore throat and rhinorrhea.   Eyes: Negative for photophobia and visual disturbance.  Respiratory: Positive for cough (productive), sputum production, shortness of breath and wheezing.   Cardiovascular: Negative for chest pain and leg swelling.  Gastrointestinal: Negative for nausea, vomiting, abdominal pain, diarrhea and constipation.  Endocrine: Negative for polyphagia and polyuria.  Genitourinary: Negative for dysuria, flank pain, vaginal bleeding, vaginal discharge and enuresis.  Musculoskeletal: Negative for back pain and gait problem.  Skin: Negative for color change and rash.  Neurological: Negative for dizziness, syncope, light-headedness and numbness.  Hematological: Negative for adenopathy. Does not bruise/bleed easily.  All other systems reviewed and are negative.    Allergies  Lisinopril; Primidone; Warfarin and related; and Clonidine derivatives  Home Medications   No current outpatient prescriptions on file. BP 102/69  Pulse 62  Temp(Src) 98 F (36.7 C) (Oral)  Resp 20  Ht 5' 3.5" (1.613 m)  Wt 162 lb 11.2 oz (73.8 kg)  BMI 28.37 kg/m2  SpO2 94% Physical Exam  Vitals reviewed. Constitutional: She is oriented to person, place, and time. She appears well-developed and well-nourished.  HENT:  Head: Normocephalic and atraumatic.  Right Ear: External ear normal.  Left Ear: External ear normal.  Eyes: Conjunctivae and EOM are normal. Pupils are equal, round, and reactive to light.  Neck: Normal range of motion. Neck supple.  Cardiovascular: Normal  rate, regular rhythm, normal heart sounds and intact distal pulses.   Pulmonary/Chest: Effort normal. She has rales in the right lower field and the left lower field.  Abdominal: Soft. Bowel sounds are normal. There is no tenderness.  Musculoskeletal: Normal range of motion.  Neurological: She is alert and oriented to person, place, and time.  Skin: Skin is warm and dry.    ED Course  Procedures (including critical care time) Labs Review Labs Reviewed  BASIC METABOLIC PANEL - Abnormal; Notable for the following:    Glucose, Bld 136 (*)    BUN 40 (*)    Creatinine, Ser 1.95 (*)    GFR calc non Af Amer 22 (*)    GFR calc Af Amer 25 (*)    All other components within normal limits  PRO B NATRIURETIC PEPTIDE - Abnormal; Notable for the following:    Pro B Natriuretic peptide (BNP) 6275.0 (*)    All other components within normal limits  CBC  CBC WITH DIFFERENTIAL  COMPREHENSIVE METABOLIC PANEL  MAGNESIUM  PRO B NATRIURETIC PEPTIDE  TSH  CBC  CREATININE, SERUM  MAGNESIUM  POCT I-STAT TROPONIN I   Imaging Review Dg Chest 2 View  04/23/2013   CLINICAL DATA:  Shortness of breath and cough  EXAM: CHEST  2 VIEW  COMPARISON:  05/15/2012  FINDINGS: Moderate cardiac enlargement. Pulmonary vascular congestion is noted. No edema or effusion. There is no airspace consolidation identified. Spondylosis is identified within the thoracic spine.  IMPRESSION: Cardiac enlargement and pulmonary venous congestion.   Electronically Signed   By: Signa Kell M.D.   On: 04/23/2013 16:34   Date: 04/23/2013  Rate: 46  Rhythm: sinus bradycardia  QRS Axis: normal  Intervals: PR prolonged  ST/T Wave abnormalities: nonspecific T wave changes  Conduction Disutrbances:first-degree A-V block   Narrative Interpretation: Sinus bradycardia  Old EKG Reviewed: No change    MDM   1. Bradycardia by electrocardiogram   2. Asthma   3. Chronic diastolic congestive heart failure   4. Cough   5. Pulmonary  hypertension    79 y.o. female  with pertinent PMH of CHF, asthma, prior PE, afib, CVA presents with cough and dyspnea x1 week, associated with generalized malaise.  She denies fever, gi symptoms, or pain anywhere, and her symptoms were initially relieved by albuterol, however due to persistent cough she was seen by her PCP today, found to have a HR in low 40s, BP stable, and hypoxic to 89%.  She has had home O2 in the past due to CHF exacerbation, however has not recently needed o2.  On arrival here from PCP, pt with improvement in symptoms after albuterol, ecg repeated and consistent with prior as above. CXR with pulmonary edema, and labs consistent with CHF exacerbation.  No signs of PNA.  Consulted medicine and pt admitted.     Labs and imaging as above reviewed by myself and attending,Dr. Ranae Palms, with whom case was discussed.   1. Bradycardia by electrocardiogram   2. Asthma   3. Chronic diastolic congestive heart failure   4. Cough   5. Pulmonary  hypertension        Noel Gerold, MD 04/23/13 [id number]

## 2013-04-23 NOTE — ED Notes (Signed)
Pt sent from PCP with increased SOB and noted brady; pt with hx of afib and irregular brady rhythm at present; pt O2 80% on RA increased with O2; pt being treated with antibiotics for respiratory infection lately; pt noted to be labored

## 2013-04-23 NOTE — ED Notes (Signed)
Patient transported to X-ray 

## 2013-04-24 DIAGNOSIS — I5033 Acute on chronic diastolic (congestive) heart failure: Secondary | ICD-10-CM | POA: Diagnosis present

## 2013-04-24 DIAGNOSIS — I5032 Chronic diastolic (congestive) heart failure: Secondary | ICD-10-CM | POA: Insufficient documentation

## 2013-04-24 LAB — COMPREHENSIVE METABOLIC PANEL
AST: 19 U/L (ref 0–37)
Alkaline Phosphatase: 104 U/L (ref 39–117)
BUN: 36 mg/dL — ABNORMAL HIGH (ref 6–23)
CO2: 34 mEq/L — ABNORMAL HIGH (ref 19–32)
Calcium: 8.8 mg/dL (ref 8.4–10.5)
Chloride: 98 mEq/L (ref 96–112)
Creatinine, Ser: 1.8 mg/dL — ABNORMAL HIGH (ref 0.50–1.10)
GFR calc Af Amer: 28 mL/min — ABNORMAL LOW (ref >90)
GFR calc non Af Amer: 24 mL/min — ABNORMAL LOW (ref >90)
Glucose, Bld: 88 mg/dL (ref 70–99)
Sodium: 143 mEq/L (ref 135–145)
Total Protein: 6.4 g/dL (ref 6.0–8.3)

## 2013-04-24 LAB — PRO B NATRIURETIC PEPTIDE: Pro B Natriuretic peptide (BNP): 6054 pg/mL — ABNORMAL HIGH (ref 0–450)

## 2013-04-24 LAB — CBC WITH DIFFERENTIAL/PLATELET
Basophils Absolute: 0 10*3/uL (ref 0.0–0.1)
Eosinophils Absolute: 0.5 10*3/uL (ref 0.0–0.7)
Eosinophils Relative: 7 % — ABNORMAL HIGH (ref 0–5)
HCT: 36.3 % (ref 36.0–46.0)
Lymphocytes Relative: 23 % (ref 12–46)
Lymphs Abs: 1.7 10*3/uL (ref 0.7–4.0)
MCH: 30.6 pg (ref 26.0–34.0)
MCV: 95 fL (ref 78.0–100.0)
Monocytes Absolute: 0.8 10*3/uL (ref 0.1–1.0)
Monocytes Relative: 10 % (ref 3–12)
Platelets: 163 10*3/uL (ref 150–400)
RBC: 3.82 MIL/uL — ABNORMAL LOW (ref 3.87–5.11)
RDW: 15.2 % (ref 11.5–15.5)
WBC: 7.3 10*3/uL (ref 4.0–10.5)

## 2013-04-24 LAB — MAGNESIUM
Magnesium: 2 mg/dL (ref 1.5–2.5)
Magnesium: 1.9 mg/dL (ref 1.5–2.5)

## 2013-04-24 LAB — D-DIMER, QUANTITATIVE: D-Dimer, Quant: 1.24 ug/mL-FEU — ABNORMAL HIGH (ref 0.00–0.48)

## 2013-04-24 LAB — TSH: TSH: 2.326 u[IU]/mL (ref 0.350–4.500)

## 2013-04-24 MED ORDER — ENOXAPARIN SODIUM 30 MG/0.3ML ~~LOC~~ SOLN
30.0000 mg | SUBCUTANEOUS | Status: DC
  Administered 2013-04-24 – 2013-04-26 (×3): 30 mg via SUBCUTANEOUS
  Filled 2013-04-24 (×4): qty 0.3

## 2013-04-24 MED ORDER — FUROSEMIDE 10 MG/ML IJ SOLN
40.0000 mg | Freq: Two times a day (BID) | INTRAMUSCULAR | Status: DC
  Administered 2013-04-24 – 2013-04-25 (×2): 40 mg via INTRAVENOUS
  Filled 2013-04-24 (×2): qty 4

## 2013-04-24 NOTE — Progress Notes (Signed)
Pt admitted from ED, upon arrival pt has SOB, O2 on room air was 88% put on 2.5L and O2 94%, pts HR is in the 50's, pt stable, will continue to monitor

## 2013-04-24 NOTE — Progress Notes (Signed)
[facility]                                                                                Patient Demographics  [name], is a 79 y.o. female, DOB - 1921/10/26, AVW:[id number]  Admit date - 04/23/2013   Admitting Physician Drema Dallas, MD  Outpatient Primary MD for the patient is No primary provider on file.  LOS - 1  Interim History: Is an 79 year old female with history of acute on chronic diastolic heart failure. Dr. Donnie Aho has been consulted and echocardiogram still pending. At this time not on any antibiotics although questionable pneumonia on chest x-ray would continue diuresis in order x-ray for the morning.  Chief Complaint  Patient presents with  . Shortness of Breath  . Bradycardia        Assessment & Plan    Acute on chronic diastolic congestive heart failure  Dr. Donnie Aho, cardiologist, has been consulted.  Echocardiogram has been ordered. Continue diuresis. Monitor patient's I.'s and O.'s as well as daily weights. Strict fluid restriction.    Asthma  Continue Atrovent and Xopenex nebulizers, Singulair, and symbicort.    Atrial fibrillation  Currently rhythm and rate control. Continue amiodarone.    Chronic kidney disease stage 3  Will continue to monitor Cr. Will continue diuresis with lasix.     Pulmonary hypertension  Continue diuresis due to CHF.  Will continue to monitor.    Bradycardia by electrocardiogram  Hold betablocker.  Dr. Donnie Aho consulted.      Cough, with possible pneumonia  Will obtain chest xray in the morning to reassess for possible pneumonia, however, patient is afebrile with no leukocytosis at this time.   Code Status: Full  Family Communication: None  Disposition Plan: Admitted   Procedures Echocardiogram to be done  Consults  Cardiology  DVT Prophylaxis  Lovenox  Lab Results  Component Value Date   PLT 163 04/24/2013    Medications  Scheduled Meds: . [START ON 04/25/2013] amiodarone  200 mg Oral 3 times  weekly  . aspirin EC  81 mg Oral Daily  . budesonide-formoterol  2 puff Inhalation BID  . bumetanide  2 mg Oral Daily  . cholecalciferol  1,000 Units Oral Daily  . doxazosin  4 mg Oral QHS  . enoxaparin (LOVENOX) injection  40 mg Subcutaneous Q24H  . ferrous sulfate  325 mg Oral QHS  . fluticasone  2 spray Each Nare Daily  . furosemide  40 mg Intravenous BID  . ipratropium  0.5 mg Nebulization TID  . levalbuterol  1.25 mg Nebulization TID  . montelukast  10 mg Oral QHS  . pantoprazole  40 mg Oral Daily  . simvastatin  10 mg Oral q1800  . sodium chloride  3 mL Intravenous Q12H   Continuous Infusions:  PRN Meds:.sodium chloride, acetaminophen, colchicine, ondansetron (ZOFRAN) IV, sodium chloride, traMADol  Antibiotics    Anti-infectives   None       Time Spent in minutes   30 minutes   Ashten Prats D.O. on 04/24/2013 at 11:48 AM  Between 7am to 7pm - Pager - [phone]  After 7pm go to [email or web address] - password [email or web address]  And look for the  night coverage person covering for me after hours  [facility] Office  [phone]    Subjective:   [name] seen and examined today.  Complains of cough. States her shortness of breath has improved slightly. Patient denies dizziness, chest pain, abdominal pain, N/V/D/C, new weakness, numbess, tingling.    Objective:   Filed Vitals:   04/23/13 2152 04/24/13 0455 04/24/13 0833 04/24/13 1100  BP:  154/63  157/52  Pulse:  58  66  Temp:  98.4 F (36.9 C)    TempSrc:  Oral    Resp:  18    Height:      Weight:  72.1 kg (158 lb 15.2 oz)    SpO2: 94% 96% 97%     Wt Readings from Last 3 Encounters:  04/24/13 72.1 kg (158 lb 15.2 oz)  06/10/11 69.2 kg (152 lb 8.9 oz)  06/10/11 67.405 kg (148 lb 9.6 oz)     Intake/Output Summary (Last 24 hours) at 04/24/13 1148 Last data filed at 04/24/13 1124  Gross per 24 hour  Intake    243 ml  Output   2600 ml  Net  -2357 ml    Exam  General: Well developed,  well nourished, NAD, appears stated age  HEENT: NCAT, PERRLA, EOMI, Anicteic Sclera, mucous membranes moist. No pharyngeal erythema or exudates  Neck: Supple, no JVD, no masses  Cardiovascular: S1 S2 auscultated, 2/6 SEM, Regular rate and rhythm.  Respiratory: Clear to auscultation bilaterally, however decreased at the bases.  Abdomen: Soft, obese, non-tender to palpation, + bowel sounds  Extremities: +2 edema in LE B/L. Wwarm dry without cyanosis clubbing  Neuro: AAOx3, cranial nerves grossly intact.   Skin: Without rashes exudates or nodules  Psych: Normal affect and demeanor with intact judgement and insight   Data Review   Micro Results No results found for this or any previous visit (from the past 240 hour(s)).  Radiology Reports Dg Chest 2 View  04/23/2013   CLINICAL DATA:  Shortness of breath and cough  EXAM: CHEST  2 VIEW  COMPARISON:  05/15/2012  FINDINGS: Moderate cardiac enlargement. Pulmonary vascular congestion is noted. No edema or effusion. There is no airspace consolidation identified. Spondylosis is identified within the thoracic spine.  IMPRESSION: Cardiac enlargement and pulmonary venous congestion.   Electronically Signed   By: Signa Kell M.D.   On: 04/23/2013 16:34    CBC  Recent Labs Lab 04/23/13 1715 04/24/13 0651  WBC 7.2 7.3  HGB 12.4 11.7*  HCT 37.0 36.3  PLT 155 163  MCV 94.4 95.0  MCH 31.6 30.6  MCHC 33.5 32.2  RDW 15.3 15.2  LYMPHSABS  --  1.7  MONOABS  --  0.8  EOSABS  --  0.5  BASOSABS  --  0.0    Chemistries   Recent Labs Lab 04/23/13 1512 04/23/13 1715 04/24/13 0651  NA  --  141 143  K  --  4.0 3.3*  CL  --  99 98  CO2  --  30 34*  GLUCOSE  --  136* 88  BUN  --  40* 36*  CREATININE  --  1.95* 1.80*  CALCIUM  --  9.0 8.8  MG 2.0  --  1.9  AST  --   --  19  ALT  --   --  11  ALKPHOS  --   --  104  BILITOT  --   --  0.8    ------------------------------------------------------------------------------------------------------------------ estimated creatinine clearance is 20.4 ml/min (  by C-G formula based on Cr of 1.8). ------------------------------------------------------------------------------------------------------------------ No results found for this basename: HGBA1C,  in the last 72 hours ------------------------------------------------------------------------------------------------------------------ No results found for this basename: CHOL, HDL, LDLCALC, TRIG, CHOLHDL, LDLDIRECT,  in the last 72 hours ------------------------------------------------------------------------------------------------------------------ No results found for this basename: TSH, T4TOTAL, FREET3, T3FREE, THYROIDAB,  in the last 72 hours ------------------------------------------------------------------------------------------------------------------ No results found for this basename: VITAMINB12, FOLATE, FERRITIN, TIBC, IRON, RETICCTPCT,  in the last 72 hours  Coagulation profile No results found for this basename: INR, PROTIME,  in the last 168 hours  No results found for this basename: DDIMER,  in the last 72 hours  Cardiac Enzymes No results found for this basename: CK, CKMB, TROPONINI, MYOGLOBIN,  in the last 168 hours ------------------------------------------------------------------------------------------------------------------ No components found with this basename: POCBNP,

## 2013-04-24 NOTE — Consult Note (Signed)
Cardiology Consult Note  Admit date: 04/23/2013 Name: [name] 79 y.o.  female DOB:  1921/11/23 MRN:  [id number]  Today's date:  04/24/2013  Referring Physician:    [facility]  Primary Physician:    Dr. Laurann Montana  Reason for Consultation:    Diastolic congestive heart failure  IMPRESSIONS: 1. Acute on chronic diastolic congestive heart failure likely exacerbated by recent upper respiratory infection and bradycardia 2. Hypertensive heart disease 3. History of atrial fibrillation currently in sinus rhythm on low-dose amiodarone 4. History of diverticular GI bleed 5. History of pulmonary embolus following pelvic fracture  RECOMMENDATION: 1. Repeat echocardiogram 2.  check d-dimer to be sure has not had pulmonary embolus as she is high risk for this in the past. 3. Continue diuresis 4. Reduce diltiazem and may placed back on low-dose blocker depending on what heart rate response is to this  HISTORY: This 79 year old female is a known history of diastolic heart failure with preserved ejection fraction. She has a history of pulmonary emboli in the past and has a history of GI bleeding due to diverticular disease and has been just managed with aspirin for anticoagulation. She was admitted a couple years ago following an episode of bronchitis and low oxygen saturations. During that admission she required intubation and treatment of pneumonia and also developed acute kidney failure. She is expressed wishes to be a DO NOT INTUBATE.  She normally lives with her daughter and developed an upper respiratory infection with cough productive of purulent sputum and saw her family doctor a week ago. She was given antibiotics but went to see her yesterday with continuing cough, some edema, and hypoxemia. She was admitted to the hospital for further evaluation. She was found to have an elevated BNP and is being diuresed now. She is not currently short of breath today.  Past Medical  History  Diagnosis Date  . Acute on chronic diastolic congestive heart failure 05/28/2011  . Hypertensive heart disease without CHF 05/28/2011  . History of pulmonary embolus (PE) 05/28/2011    2006 following pelvic fracture  . Chronic venous insufficiency 05/28/2011  . Hyperlipidemia 05/28/2011  . Gout 05/28/2011  . Asthma 05/28/2011  . History of GI diverticular bleed 05/28/2011    June 2012  . Atrial fibrillation 05/28/2011  . Chronic kidney disease stage 3 05/28/2011  . Kidney stones   . Osteoporosis   . Pulmonary hypertension 05/28/2011  . GERD (gastroesophageal reflux disease) 05/28/2011  . Mitral regurgitation 05/31/2011  . Secondary hyperparathyroidism (of renal origin)   . Anemia of chronic disease   . History of CVA (cerebrovascular accident)   . Chronic diastolic congestive heart failure   . Hypertension   . Pneumonia   . Arthritis       Past Surgical History  Procedure Laterality Date  . Partial colectomy      1999 following colonoscopy complication  . Left hip replacement      2004, complicated with dislocation  . Right hip replacement      2005  . Hernia repair    . Bilateral cataract surgery    . Colon surgery      Allergies:  is allergic to lisinopril; primidone; warfarin and related; and clonidine derivatives.   Medications: Prior to Admission medications   Medication Sig Start Date End Date Taking? Authorizing Provider  amiodarone (PACERONE) 200 MG tablet Take 200 mg by mouth 3 (three) times a week. Take one tablet on Monday, Wednesday, and Friday    Yes  Historical Provider, MD  aspirin EC 81 MG tablet Take 81 mg by mouth daily.   Yes Historical Provider, MD  budesonide-formoterol (SYMBICORT) 80-4.5 MCG/ACT inhaler Inhale 2 puffs into the lungs 2 (two) times daily.    Yes Historical Provider, MD  bumetanide (BUMEX) 2 MG tablet Take 2 mg by mouth daily.     Yes Historical Provider, MD  chlorpheniramine-HYDROcodone (TUSSIONEX) 10-8 MG/5ML LQCR Take 5 mLs by mouth  every 12 (twelve) hours as needed (for cough).  04/17/13  Yes Historical Provider, MD  cholecalciferol (VITAMIN D) 1000 UNITS tablet Take 1,000 Units by mouth daily.     Yes Historical Provider, MD  colchicine 0.6 MG tablet Take 0.6 mg by mouth daily as needed (for gout).    Yes Historical Provider, MD  diltiazem (CARDIZEM CD) 180 MG 24 hr capsule Take 180 mg by mouth daily.     Yes Historical Provider, MD  doxazosin (CARDURA) 4 MG tablet Take 4 mg by mouth at bedtime.     Yes Historical Provider, MD  ferrous sulfate 325 (65 FE) MG tablet Take 325 mg by mouth at bedtime.    Yes Historical Provider, MD  fluticasone (FLONASE) 50 MCG/ACT nasal spray Place 2 sprays into the nose daily.   Yes Historical Provider, MD  montelukast (SINGULAIR) 10 MG tablet Take 10 mg by mouth at bedtime. For shortness of breath   Yes Historical Provider, MD  Multiple Vitamins-Minerals (MULTIVITAMINS THER. W/MINERALS) TABS Take 1 tablet by mouth daily.     Yes Historical Provider, MD  omeprazole (PRILOSEC) 20 MG capsule Take 20 mg by mouth daily.     Yes Historical Provider, MD  pravastatin (PRAVACHOL) 20 MG tablet Take 20 mg by mouth every evening.     Yes Historical Provider, MD  propranolol (INDERAL) 20 MG tablet Take 20 mg by mouth 2 (two) times daily.  04/17/13  Yes Historical Provider, MD  sodium chloride (OCEAN) 0.65 % nasal spray Place 1 spray into the nose as needed for congestion.   Yes Historical Provider, MD  traMADol (ULTRAM) 50 MG tablet Take 50 mg by mouth every 8 (eight) hours as needed for pain. For muscle spasms. Maximum dose= 8 tablets per day   Yes Historical Provider, MD  moxifloxacin (AVELOX) 400 MG tablet Take 400 mg by mouth daily. Start 9.23.14 end 9.29.14 04/17/13   Historical Provider, MD    Family History: Family Status  Relation Status Death Age  . Brother Deceased     DVT and thrombus  . Brother Deceased     CVA    Social History:   reports that she has never smoked. She has never used  smokeless tobacco. She reports that she does not drink alcohol.   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 She has no skin problems. She has had a recent GI bleeding has mild constipation. She has significant arthritis and urinary frequency as well as an unsteady gait. Other than as noted above the remainder of the review of systems is unremarkable.  Physical Exam: BP 154/63  Pulse 58  Temp(Src) 98.4 F (36.9 C) (Oral)  Resp 18  Ht 5' 3.5" (1.613 m)  Wt 72.1 kg (158 lb 15.2 oz)  BMI 27.71 kg/m2  SpO2 97%  General appearance: Pleasant elderly white female in no acute distress wearing oxygen Head: Normocephalic, without obvious abnormality, atraumatic Eyes: conjunctivae/corneas  clear. PERRL, EOM's intact. Fundi not examined Neck: no adenopathy, no carotid bruit, no JVD and supple, symmetrical, trachea midline Lungs: Reduced breath sounds at the bases Heart: Somewhat irregular rhythm, normal S1-S2, no S3, 2/6 systolic murmur at left sternal border Abdomen: soft, non-tender; bowel sounds normal; no masses,  no organomegaly Pelvic: deferred Extremities: Changes of chronic insufficiency noted, 1-2+ edema Pulses: 2+ and symmetric Neurologic: Grossly normal  Labs: CBC  Recent Labs  04/24/13 0651  WBC 7.3  RBC 3.82*  HGB 11.7*  HCT 36.3  PLT 163  MCV 95.0  MCH 30.6  MCHC 32.2  RDW 15.2  LYMPHSABS 1.7  MONOABS 0.8  EOSABS 0.5  BASOSABS 0.0   CMP   Recent Labs  04/24/13 0651  NA 143  K 3.3*  CL 98  CO2 34*  GLUCOSE 88  BUN 36*  CREATININE 1.80*  CALCIUM 8.8  PROT 6.4  ALBUMIN 3.5  AST 19  ALT 11  ALKPHOS 104  BILITOT 0.8  GFRNONAA 24*  GFRAA 28*   BNP (last 3 results)  Recent Labs  04/23/13 1715 04/23/13 2245  PROBNP 6275.0* 6054.0*   Cardiac Panel (last 3 results) No results found for this basename: CKTOTAL, CKMB,  TROPONINI, RELINDX,  in the last 72 hours   Radiology: Cardiomegaly and pulmonary venous congestion  EKG: Sinus bradycardia with PACs  Signed:  W. Ashley Royalty MD [facility]   Cardiology Consultant  04/24/2013, 10:14 AM

## 2013-04-25 ENCOUNTER — Inpatient Hospital Stay (HOSPITAL_COMMUNITY): Payer: Medicare Other

## 2013-04-25 DIAGNOSIS — I5033 Acute on chronic diastolic (congestive) heart failure: Secondary | ICD-10-CM

## 2013-04-25 DIAGNOSIS — I119 Hypertensive heart disease without heart failure: Secondary | ICD-10-CM

## 2013-04-25 LAB — BASIC METABOLIC PANEL
BUN: 30 mg/dL — ABNORMAL HIGH (ref 6–23)
CO2: 35 mEq/L — ABNORMAL HIGH (ref 19–32)
Calcium: 9.2 mg/dL (ref 8.4–10.5)
Creatinine, Ser: 1.67 mg/dL — ABNORMAL HIGH (ref 0.50–1.10)
GFR calc Af Amer: 30 mL/min — ABNORMAL LOW (ref >90)
Potassium: 4 mEq/L (ref 3.5–5.1)
Sodium: 138 mEq/L (ref 135–145)

## 2013-04-25 LAB — MAGNESIUM: Magnesium: 1.8 mg/dL (ref 1.5–2.5)

## 2013-04-25 LAB — HEMOGLOBIN A1C: Hgb A1c MFr Bld: 6 % — ABNORMAL HIGH (ref <5.7)

## 2013-04-25 MED ORDER — INSULIN ASPART 100 UNIT/ML ~~LOC~~ SOLN
3.0000 [IU] | Freq: Three times a day (TID) | SUBCUTANEOUS | Status: DC
  Administered 2013-04-25 – 2013-04-27 (×3): 3 [IU] via SUBCUTANEOUS

## 2013-04-25 MED ORDER — FUROSEMIDE 10 MG/ML IJ SOLN
40.0000 mg | Freq: Three times a day (TID) | INTRAMUSCULAR | Status: DC
  Administered 2013-04-25 (×2): 40 mg via INTRAVENOUS
  Filled 2013-04-25 (×4): qty 4

## 2013-04-25 MED ORDER — INSULIN ASPART 100 UNIT/ML ~~LOC~~ SOLN: 0.0000 [IU] | Freq: Every day | SUBCUTANEOUS | Status: DC

## 2013-04-25 MED ORDER — INSULIN ASPART 100 UNIT/ML ~~LOC~~ SOLN
0.0000 [IU] | Freq: Three times a day (TID) | SUBCUTANEOUS | Status: DC
  Administered 2013-04-25: 18:00:00 2 [IU] via SUBCUTANEOUS
  Administered 2013-04-26: 13:00:00 3 [IU] via SUBCUTANEOUS

## 2013-04-25 MED ORDER — POTASSIUM CHLORIDE 20 MEQ/15ML (10%) PO LIQD
40.0000 meq | Freq: Once | ORAL | Status: AC
  Administered 2013-04-25: 11:00:00 40 meq via ORAL
  Filled 2013-04-25 (×2): qty 30

## 2013-04-25 NOTE — Evaluation (Signed)
Physical Therapy Evaluation Patient Details Name: [name] MRN: [id number] DOB: March 05, 1922 Today's Date: 04/25/2013 Time: 9563-8756 PT Time Calculation (min): 15 min  PT Assessment / Plan / Recommendation History of Present Illness  79 y.o. female PMHx diastolic CHF, HTN, A. fib, asthma, prior PE, CVA, history GI diverticular bleed, CKD stage III. Presented with productive cough (yellow) and dyspnea x1 week, associated with generalized malaise. She denies fever, gi symptoms, or pain anywhere, and her symptoms were initially relieved by albuterol, however due to persistent productive cough which now has turned clear she was seen by her PCP today, found to have a HR in low 40s, BP stable, and hypoxic to 89%  Clinical Impression  Pt admitted with dyspnea and generalized malaise. Pt currently with functional limitations due to the deficits listed below (see PT Problem List). Pt able to amb. 100' with SPC and min guard on 3L O2 [place], as pt's O2 sat dropped to 85% without O2 during amb.  Pt may benefit from home O2, please see O2 sat qualification section.  Pt will benefit from skilled PT to increase their independence and safety with mobility to allow discharge.    PT Assessment  Patient needs continued PT services    Follow Up Recommendations  Supervision for mobility/OOB;Home health PT    Does the patient have the potential to tolerate intense rehabilitation      Barriers to Discharge        Equipment Recommendations  None recommended by PT    Recommendations for Other Services     Frequency Min 3X/week    Precautions / Restrictions Precautions Precautions: Fall Restrictions Weight Bearing Restrictions: No   Pertinent Vitals/Pain No c/o pain during session. SATURATION QUALIFICATIONS: (This note is used to comply with regulatory documentation for home oxygen)  Patient Saturations on Room Air at Rest = 88%  Patient Saturations on Room Air while Ambulating = 85%  Patient  Saturations on 3 Liters of oxygen while Ambulating = 91-92%  Please briefly explain why patient needs home oxygen:  Pt c/o SOB during ambulation on room air, 3L of O2 [place] reapplied and SOB improved and O2 sat increased.      Mobility  Bed Mobility Bed Mobility: Sit to Supine Sit to Supine: 5: Supervision Details for Bed Mobility Assistance: Supervision to ensure safety. Transfers Transfers: Sit to Stand;Stand to Sit Sit to Stand: 4: Min assist;With armrests;From chair/3-in-1 Stand to Sit: 4: Min guard;To bed;To chair/3-in-1 Details for Transfer Assistance: Sit to stand to/from chair performed x2, once to amb and for seated rest break after amb. min A during sit to stand after amb, due to 1 LOB episode after ambulation, otherwise, min guard to ensure safety as pt reports slight SOB on 3L of O2 [place]. O2 sat in sitting while on 3L of O2 (94%) and on room air: 88%.  Ambulation/Gait Ambulation/Gait Assistance: 4: Min guard Ambulation Distance (Feet): 100 Feet Assistive device: Straight cane Ambulation/Gait Assistance Details: Min guard to ensure safety. O2 sat dropped to 85% during amb., 3L of O2 [place] reapplied during amb and O2 sat increased to 92% after standing rest break. Gait Pattern: Step-through pattern;Decreased stride length Gait velocity: Decreased    Exercises     PT Diagnosis: Difficulty walking;Generalized weakness  PT Problem List: Decreased strength;Decreased activity tolerance;Decreased balance;Decreased mobility PT Treatment Interventions: Gait training;Stair training;Functional mobility training;Therapeutic activities;Therapeutic exercise;Balance training;Neuromuscular re-education;Patient/family education     PT Goals(Current goals can be found in the care plan section) Acute Rehab PT  Goals Patient Stated Goal: to go home PT Goal Formulation: With patient Time For Goal Achievement: 05/09/13 Potential to Achieve Goals: Good  Visit Information  Last PT Received On:  04/25/13 Assistance Needed: +1 History of Present Illness: 79 y.o. female PMHx diastolic CHF, HTN, A. fib, asthma, prior PE, CVA, history GI diverticular bleed, CKD stage III. Presented with productive cough (yellow) and dyspnea x1 week, associated with generalized malaise. She denies fever, gi symptoms, or pain anywhere, and her symptoms were initially relieved by albuterol, however due to persistent productive cough which now has turned clear she was seen by her PCP today, found to have a HR in low 40s, BP stable, and hypoxic to 89%       Prior Functioning  Home Living Family/patient expects to be discharged to:: Private residence Living Arrangements: Children Available Help at Discharge: Available 24 hours/day Type of Home: House Home Access: Stairs to enter [employer] of Steps: 2 Entrance Stairs-Rails: None Home Layout: One level Home Equipment: Cane - single point;Shower seat;Bedside commode;Walker - 2 wheels Prior Function Level of Independence: Independent with assistive device(s) Communication Communication: HOH    Cognition  Cognition Arousal/Alertness: Awake/alert Behavior During Therapy: WFL for tasks assessed/performed Overall Cognitive Status: Within Functional Limits for tasks assessed    Extremity/Trunk Assessment Lower Extremity Assessment Lower Extremity Assessment: Generalized weakness   Balance    End of Session PT - End of Session Equipment Utilized During Treatment: Gait belt;Oxygen Activity Tolerance: Patient limited by fatigue Patient left: in bed;with call bell/phone within reach  GP     Sol Blazing 04/25/2013, 4:34 PM

## 2013-04-25 NOTE — Care Management Note (Addendum)
  Page 2 of 2   04/25/2013     11:21:42 AM   CARE MANAGEMENT NOTE 04/25/2013  Patient:  [name], [name]   Account Number:  [id number]  Date Initiated:  04/25/2013  Documentation initiated by:  [facility]  Subjective/Objective Assessment:   79 y.o. female PMHx  diastolic CHF, HTN, A. fib, asthma, prior PE,  CVA, history GI diverticular bleed, CKD stage III. Presented w/ productive cough (yellow) and dyspnea x1 week, assoc with generalized malaise.//hm alone-[place] door.     Action/Plan:   diurese//home with home health   Anticipated DC Date:  04/27/2013   Anticipated DC Plan:  HOME W HOME HEALTH SERVICES      DC Planning Services  CM consult      [facility] Choice  HOME HEALTH   Choice offered to / List presented to:  C-1 Patient        HH arranged  HH-1 RN  HH-10 DISEASE MANAGEMENT      HH agency  [facility]   Status of service:  Completed, signed off Medicare Important Message given?   (If response is "NO", the following Medicare IM given date fields will be blank) Date Medicare IM given:   Date Additional Medicare IM given:    Discharge Disposition:    Per UR Regulation:  Reviewed for med. necessity/level of care/duration of stay  If discussed at Long Length of Stay Meetings, dates discussed:    Comments:  04/25/13 1030 Luwana Butrick, RN, BSN, [place] [phone] NCM spoke with patient concerning discharge planning. Pt offered choice for [facility] services upon discharge. Per pt choice [facility] to provide [facility] services.  [facility] rep, Annia Belt  contacted concerning new referral. Pt to discharge home alone. Pt request [facility]HRN for disease management upon discharge. Pt walks with a cane, no additional DME needs identified at this time.

## 2013-04-25 NOTE — Progress Notes (Signed)
Report giving to receiving RN. Patient is in stable condition and resting. No verbal complaints and no signs or symptoms of distress or discomfort. 

## 2013-04-25 NOTE — Progress Notes (Signed)
SATURATION QUALIFICATIONS: (This note is used to comply with regulatory documentation for home oxygen)  Patient Saturations on Room Air at Rest = 78%    

## 2013-04-25 NOTE — Progress Notes (Signed)
[facility] PROGRESS NOTE Interim History: 79 y.o. female PMHx diastolic CHF, HTN, A. fib, asthma, prior PE, CVA, history GI diverticular bleed, CKD stage III. Presented with productive cough (yellow) and dyspnea x1 week, associated with generalized malaise. She denies fever, gi symptoms, or pain anywhere, and her symptoms were initially relieved by albuterol, however due to persistent productive cough which now has turned clear she was seen by her PCP today, found to have a HR in low 40s, BP stable, and hypoxic to 89%   Intake/Output Summary (Last 24 hours) at 04/25/13 1310 Last data filed at 04/25/13 1051  Gross per 24 hour  Intake    723 ml  Output   3750 ml  Net  -3027 ml       Filed Weights   04/23/13 2116 04/24/13 0455 04/25/13 0524  Weight: 73.8 kg (162 lb 11.2 oz) 72.1 kg (158 lb 15.2 oz) 69.083 kg (152 lb 4.8 oz)       Recent Labs  04/23/13 1715 04/23/13 2245  PROBNP 6275.0* 6054.0*     Assessment/Plan: Acute on chronic diastolic congestive heart failure: - 69.0 kg, on lasix IV and oral bumex. Cardio-renal syndrome. - negative 3.0 L in 24hrs. Will hold oral bumex and increase IV lasix TID as Bp allow it. - Cr trending down with diuresis. Monitor electrolytes, WIll need ACE-I. - Check B-met and BNP in am. - ECHO pending.  AKI on chronic KD: - Due to cardiorenal syndrome. - cont diuresis. - baseline Cr. 1.6   Bradycardia by electrocardiogram  Hyperglycemia without a diagnosis of DM: - start SSI. Check Hbg A1c.   Hypertensive heart disease without CHF - cont lasix  and Cardura.  Paroxysmal Atrial fibrillation - On amiodarone. - Sinus bradycardia.   Code Status: full Family Communication: none  Disposition Plan:  inpatient   Consultants:  cardiology  Procedures: ECHO 2009:EF 70 %. Left ventricular wall thickness was moderately increased.  echo 2014 pending   Antibiotics:  none  HPI/Subjective: Relates SOB is  improved.  Objective: Filed Vitals:   04/25/13 0524 04/25/13 0820 04/25/13 0857 04/25/13 1104  BP: 131/70  121/52 134/44  Pulse: 91  75 55  Temp: 98.3 F (36.8 C)     TempSrc: Oral     Resp: 19     Height:      Weight: 69.083 kg (152 lb 4.8 oz)     SpO2: 93% 94%       Exam:  General: Alert, awake, oriented x3, in no acute distress.  HEENT: No bruits, no goiter. +JVD Heart: Regular rate and rhythm, without murmurs, rubs, gallops.  Lungs: Good air movement, clear to auscultation. Abdomen: Soft, nontender, nondistended, positive bowel sounds.  Neuro: Grossly intact, nonfocal.   Data Reviewed: Basic Metabolic Panel:  Recent Labs Lab 04/23/13 1512 04/23/13 1715 04/24/13 0651 04/25/13 0930  NA  --  141 143 138  K  --  4.0 3.3* 4.0  CL  --  99 98 89*  CO2  --  30 34* 35*  GLUCOSE  --  136* 88 273*  BUN  --  40* 36* 30*  CREATININE  --  1.95* 1.80* 1.67*  CALCIUM  --  9.0 8.8 9.2  MG 2.0  --  1.9 1.8   Liver Function Tests:  Recent Labs Lab 04/24/13 0651  AST 19  ALT 11  ALKPHOS 104  BILITOT 0.8  PROT 6.4  ALBUMIN 3.5   No results found for this basename: LIPASE, AMYLASE,  in  the last 168 hours No results found for this basename: AMMONIA,  in the last 168 hours CBC:  Recent Labs Lab 04/23/13 1715 04/24/13 0651  WBC 7.2 7.3  NEUTROABS  --  4.3  HGB 12.4 11.7*  HCT 37.0 36.3  MCV 94.4 95.0  PLT 155 163   Cardiac Enzymes: No results found for this basename: CKTOTAL, CKMB, CKMBINDEX, TROPONINI,  in the last 168 hours BNP (last 3 results)  Recent Labs  04/23/13 1715 04/23/13 2245  PROBNP 6275.0* 6054.0*   CBG: No results found for this basename: GLUCAP,  in the last 168 hours  No results found for this or any previous visit (from the past 240 hour(s)).   Studies: Dg Chest 2 View  04/23/2013   CLINICAL DATA:  Shortness of breath and cough  EXAM: CHEST  2 VIEW  COMPARISON:  05/15/2012  FINDINGS: Moderate cardiac enlargement. Pulmonary  vascular congestion is noted. No edema or effusion. There is no airspace consolidation identified. Spondylosis is identified within the thoracic spine.  IMPRESSION: Cardiac enlargement and pulmonary venous congestion.   Electronically Signed   By: Signa Kell M.D.   On: 04/23/2013 16:34   Dg Chest Port 1 View  04/25/2013   CLINICAL DATA:  Cough, shortness of Breath.  EXAM: PORTABLE CHEST - 1 VIEW  COMPARISON:  04/23/2013  FINDINGS: Cardiomegaly. Minimal bibasilar atelectasis and small left effusion. No acute bony abnormality. Diffuse aortic calcifications.  IMPRESSION: Cardiomegaly, bibasilar atelectasis. Small left effusion.   Electronically Signed   By: Charlett Nose M.D.   On: 04/25/2013 07:00    Scheduled Meds: . amiodarone  200 mg Oral 3 times weekly  . aspirin EC  81 mg Oral Daily  . budesonide-formoterol  2 puff Inhalation BID  . bumetanide  2 mg Oral Daily  . cholecalciferol  1,000 Units Oral Daily  . doxazosin  4 mg Oral QHS  . enoxaparin (LOVENOX) injection  30 mg Subcutaneous Q24H  . ferrous sulfate  325 mg Oral QHS  . fluticasone  2 spray Each Nare Daily  . furosemide  40 mg Intravenous BID  . ipratropium  0.5 mg Nebulization TID  . levalbuterol  1.25 mg Nebulization TID  . montelukast  10 mg Oral QHS  . pantoprazole  40 mg Oral Daily  . simvastatin  10 mg Oral q1800  . sodium chloride  3 mL Intravenous Q12H   Continuous Infusions:    Marinda Elk  [facility] Pager [phone]. If 8PM-8AM, please contact night-coverage at [email or web address], password [facility] 04/25/2013, 1:10 PM  LOS: 2 days

## 2013-04-25 NOTE — Progress Notes (Signed)
Patient evaluated for community based chronic disease management services with [facility] Care Management Program as a benefit of patient's [employer]. Spoke with patient and her primary caregiver (daughter) at bedside to explain [facility] Care Management services.  Family has adequate support to manage patient care at this time.  Services declined.  Patient lives with daughter who is retired.  She has two adult grand children in the home who also assist as needed.  Dr Cliffton Asters provides same appointments and acute interventions timely that defer inpatient admissions.  Daughter understands early onset of symptoms to include changes in sleep pattern, level of activity tolerance, and nocturnal coughing.  Left contact information and [facility] literature at bedside. Made inpatient Case Manager aware that [facility] Care Management following. Of note, [facility] Care Management services does not replace or interfere with any services that are arranged by inpatient case management or social work.  For additional questions or referrals please contact Anibal Henderson BSN RN [facility] Liaison at [phone].

## 2013-04-25 NOTE — Progress Notes (Signed)
Utilization Review Completed Rayma Hegg J. Alfonse Garringer, RN, BSN, NCM [phone]  

## 2013-04-25 NOTE — Evaluation (Signed)
I have reviewed this note and agree with all findings. Kati Srihitha Tagliaferri, PT, DPT Pager: [phone]   

## 2013-04-25 NOTE — Progress Notes (Signed)
Subjective:  Her edema has improved and her breathing is better overnight. She is diuresing significantly. Renal function has remained stable. No chest pain.  Objective:  Vital Signs in the last 24 hours: BP 121/52  Pulse 75  Temp(Src) 98.3 F (36.8 C) (Oral)  Resp 19  Ht 5' 3.5" (1.613 m)  Wt 69.083 kg (152 lb 4.8 oz)  BMI 26.55 kg/m2  SpO2 94%  Physical Exam: Pleasant elderly female in no acute distress Lungs:  Clear  Cardiac:  Regular rhythm, normal S1 and S2, no S3, 2/6 systolic murmur Abdomen:  Soft, nontender, no masses Extremities:  1+ edema present  Intake/Output from previous day: 09/30 0701 - 10/01 0700 In: 723 [P.O.:720; I.V.:3] Out: 4550 [Urine:4550] Weight Filed Weights   04/23/13 2116 04/24/13 0455 04/25/13 0524  Weight: 73.8 kg (162 lb 11.2 oz) 72.1 kg (158 lb 15.2 oz) 69.083 kg (152 lb 4.8 oz)    Lab Results: Basic Metabolic Panel:  Recent Labs  16/10/96 1715 04/24/13 0651  NA 141 143  K 4.0 3.3*  CL 99 98  CO2 30 34*  GLUCOSE 136* 88  BUN 40* 36*  CREATININE 1.95* 1.80*    CBC:  Recent Labs  04/23/13 1715 04/24/13 0651  WBC 7.2 7.3  NEUTROABS  --  4.3  HGB 12.4 11.7*  HCT 37.0 36.3  MCV 94.4 95.0  PLT 155 163    BNP    Component Value Date/Time   PROBNP 6054.0* 04/23/2013 2245    Telemetry: Sinus rhythm  Assessment/Plan:  1. Acute on chronic diastolic heart failure clinically improved 2. Mitral regurgitation 3. History of atrial fibrillation  Recommendations:  Continue diuresis and watch renal function. Replete K. Awaiting echo results.     Darden Palmer  MD [facility]  04/25/2013, 9:14 AM

## 2013-04-26 DIAGNOSIS — I1310 Hypertensive heart and chronic kidney disease without heart failure, with stage 1 through stage 4 chronic kidney disease, or unspecified chronic kidney disease: Secondary | ICD-10-CM

## 2013-04-26 DIAGNOSIS — N19 Unspecified kidney failure: Secondary | ICD-10-CM

## 2013-04-26 LAB — BASIC METABOLIC PANEL
BUN: 35 mg/dL — ABNORMAL HIGH (ref 6–23)
Calcium: 9.1 mg/dL (ref 8.4–10.5)
Chloride: 88 mEq/L — ABNORMAL LOW (ref 96–112)
Creatinine, Ser: 2.01 mg/dL — ABNORMAL HIGH (ref 0.50–1.10)
GFR calc Af Amer: 24 mL/min — ABNORMAL LOW (ref >90)
GFR calc non Af Amer: 21 mL/min — ABNORMAL LOW (ref >90)
Glucose, Bld: 90 mg/dL (ref 70–99)

## 2013-04-26 LAB — GLUCOSE, CAPILLARY: Glucose-Capillary: 220 mg/dL — ABNORMAL HIGH (ref 70–99)

## 2013-04-26 MED ORDER — SODIUM CHLORIDE 0.9 % IV BOLUS (SEPSIS): 250.0000 mL | Freq: Once | INTRAVENOUS | Status: DC

## 2013-04-26 MED ORDER — BUMETANIDE 2 MG PO TABS
2.0000 mg | ORAL_TABLET | Freq: Every day | ORAL | Status: DC
  Administered 2013-04-27: 2 mg via ORAL
  Filled 2013-04-26: qty 1

## 2013-04-26 MED ORDER — IPRATROPIUM BROMIDE 0.02 % IN SOLN
0.5000 mg | Freq: Two times a day (BID) | RESPIRATORY_TRACT | Status: DC
  Administered 2013-04-27: 08:00:00 0.5 mg via RESPIRATORY_TRACT
  Filled 2013-04-26: qty 2.5

## 2013-04-26 MED ORDER — LEVALBUTEROL HCL 1.25 MG/0.5ML IN NEBU
1.2500 mg | INHALATION_SOLUTION | Freq: Two times a day (BID) | RESPIRATORY_TRACT | Status: DC
  Administered 2013-04-27: 08:00:00 1.25 mg via RESPIRATORY_TRACT
  Filled 2013-04-26 (×3): qty 0.5

## 2013-04-26 NOTE — Progress Notes (Signed)
Echocardiogram 2D Echocardiogram has been performed.  [name] 04/26/2013, 10:20 AM

## 2013-04-26 NOTE — Progress Notes (Signed)
Patient blood pressure 90/46 manually, notified attending MD via text page. Attending MD gave orders to hold Lasix  And continue to monitor patient. Lasix held per doctor's order. Will continue to monitor to end of shift.

## 2013-04-26 NOTE — Progress Notes (Signed)
Inpatient Diabetes Program Recommendations  AACE/ADA: New Consensus Statement on Inpatient Glycemic Control (2013)  Target Ranges:  Prepandial:   less than 140 mg/dL      Peak postprandial:   less than 180 mg/dL (1-2 hours)      Critically ill patients:  140 - 180 mg/dL   Reason for Visit: Results for MAYMIE, BRUNKE (MRN [id number]) as of 04/26/2013 11:53  Ref. Range 04/25/2013 18:19 04/25/2013 21:05 04/26/2013 06:05 04/26/2013 06:29 04/26/2013 11:08  Glucose-Capillary Latest Range: 70-99 mg/dL 782 (H) 71  90 956 (H)   Consider reducing meal coverage to 2 units tid with meals.  A1C=6.0% (not indicative of diabetes).      Beryl Meager, RN, BC-ADM Inpatient Diabetes Coordinator Pager [phone]

## 2013-04-26 NOTE — Progress Notes (Signed)
Pt awake resting in bed. o2 in use . No c/o sob.

## 2013-04-26 NOTE — Progress Notes (Signed)
[facility] PROGRESS NOTE Interim History: 79 y.o. female PMHx diastolic CHF, HTN, A. fib, asthma, prior PE, CVA, history GI diverticular bleed, CKD stage III. Presented with productive cough (yellow) and dyspnea x1 week, associated with generalized malaise. She denies fever, gi symptoms, or pain anywhere, and her symptoms were initially relieved by albuterol, however due to persistent productive cough which now has turned clear she was seen by her PCP today, found to have a HR in low 40s, BP stable, and hypoxic to 89%   Intake/Output Summary (Last 24 hours) at 04/26/13 1033 Last data filed at 04/26/13 0600  Gross per 24 hour  Intake    982 ml  Output   2425 ml  Net  -1443 ml       Filed Weights   04/24/13 0455 04/25/13 0524 04/26/13 0424  Weight: 72.1 kg (158 lb 15.2 oz) 69.083 kg (152 lb 4.8 oz) 69 kg (152 lb 1.9 oz)        Recent Labs  04/23/13 1715 04/23/13 2245  PROBNP 6275.0* 6054.0*     Assessment/Plan: Acute on chronic diastolic congestive heart failure: - 69.0 kg, Hold IV and oral bumex. Cardio-renal syndrome. - negative 2.4 L in 24hrs. Liberalize diet. - Mild increase in Cr. WIll need ACE-I as an outpatient. - Check B-met and BNP in am. - ECHO pending.  AKI on chronic KD: - Due to cardiorenal syndrome. - cont diuresis. - baseline Cr. 1.6  Hyperglycemia without a diagnosis of DM: - start SSI. Check Hbg A1c.   Hypertensive heart disease without CHF - cont lasix  and Cardura.  Paroxysmal Atrial fibrillation - On amiodarone. - Sinus bradycardia.   Code Status: full Family Communication: none  Disposition Plan:  inpatient   Consultants:  cardiology  Procedures: ECHO 2009:EF 70 %. Left ventricular wall thickness was moderately increased.  echo 2014 pending   Antibiotics:  none  HPI/Subjective: Relates SOB is improved.  Objective: Filed Vitals:   04/25/13 2034 04/25/13 2035 04/26/13 0424 04/26/13 0845  BP: 151/63  138/61 90/46   Pulse: 64  79 90  Temp: 98 F (36.7 C)  98.3 F (36.8 C)   TempSrc: Oral  Oral   Resp: 19  19   Height:      Weight:   69 kg (152 lb 1.9 oz)   SpO2: 97% 96% 90%      Exam:  General: Alert, awake, oriented x3, in no acute distress.  HEENT: No bruits, no goiter. No JVD Heart: Regular rate and rhythm, without murmurs, rubs, gallops.  Lungs: Good air movement, clear to auscultation. Abdomen: Soft, nontender, nondistended, positive bowel sounds.  Neuro: Grossly intact, nonfocal.   Data Reviewed: Basic Metabolic Panel:  Recent Labs Lab 04/23/13 1512 04/23/13 1715 04/24/13 0651 04/25/13 0930 04/26/13 0605  NA  --  141 143 138 139  K  --  4.0 3.3* 4.0 3.5  CL  --  99 98 89* 88*  CO2  --  30 34* 35* 39*  GLUCOSE  --  136* 88 273* 90  BUN  --  40* 36* 30* 35*  CREATININE  --  1.95* 1.80* 1.67* 2.01*  CALCIUM  --  9.0 8.8 9.2 9.1  MG 2.0  --  1.9 1.8 1.9   Liver Function Tests:  Recent Labs Lab 04/24/13 0651  AST 19  ALT 11  ALKPHOS 104  BILITOT 0.8  PROT 6.4  ALBUMIN 3.5   No results found for this basename: LIPASE, AMYLASE,  in the  last 168 hours No results found for this basename: AMMONIA,  in the last 168 hours CBC:  Recent Labs Lab 04/23/13 1715 04/24/13 0651  WBC 7.2 7.3  NEUTROABS  --  4.3  HGB 12.4 11.7*  HCT 37.0 36.3  MCV 94.4 95.0  PLT 155 163   Cardiac Enzymes: No results found for this basename: CKTOTAL, CKMB, CKMBINDEX, TROPONINI,  in the last 168 hours BNP (last 3 results)  Recent Labs  04/23/13 1715 04/23/13 2245  PROBNP 6275.0* 6054.0*   CBG:  Recent Labs Lab 04/25/13 1819 04/25/13 2105 04/26/13 0629  GLUCAP 167* 71 90    No results found for this or any previous visit (from the past 240 hour(s)).   Studies: Dg Chest Port 1 View  04/25/2013   CLINICAL DATA:  Cough, shortness of Breath.  EXAM: PORTABLE CHEST - 1 VIEW  COMPARISON:  04/23/2013  FINDINGS: Cardiomegaly. Minimal bibasilar atelectasis and small left  effusion. No acute bony abnormality. Diffuse aortic calcifications.  IMPRESSION: Cardiomegaly, bibasilar atelectasis. Small left effusion.   Electronically Signed   By: Charlett Nose M.D.   On: 04/25/2013 07:00    Scheduled Meds: . amiodarone  200 mg Oral 3 times weekly  . aspirin EC  81 mg Oral Daily  . budesonide-formoterol  2 puff Inhalation BID  . [START ON 04/27/2013] bumetanide  2 mg Oral Daily  . cholecalciferol  1,000 Units Oral Daily  . doxazosin  4 mg Oral QHS  . enoxaparin (LOVENOX) injection  30 mg Subcutaneous Q24H  . ferrous sulfate  325 mg Oral QHS  . fluticasone  2 spray Each Nare Daily  . insulin aspart  0-5 Units Subcutaneous QHS  . insulin aspart  0-9 Units Subcutaneous TID WC  . insulin aspart  3 Units Subcutaneous TID WC  . ipratropium  0.5 mg Nebulization TID  . levalbuterol  1.25 mg Nebulization TID  . montelukast  10 mg Oral QHS  . pantoprazole  40 mg Oral Daily  . simvastatin  10 mg Oral q1800   Continuous Infusions:    Marinda Elk  [facility] Pager [phone]. If 8PM-8AM, please contact night-coverage at [email or web address], password [facility] 04/26/2013, 10:33 AM  LOS: 3 days

## 2013-04-26 NOTE — Progress Notes (Signed)
Pt O2 sat 96% tonight on 3L, pt O2 weaned to 2L. When re-checked pt had just woken up, O2 at 88% on 2L. Pt O2 increased to 3L and O2 sat 93%. Will continue to monitor.  Baron Hamper, RN

## 2013-04-26 NOTE — Progress Notes (Signed)
Patient given tylenol per PRN order for she states she has a headache. Will continue to monitor and reassess pain within an hour.

## 2013-04-26 NOTE — Progress Notes (Signed)
Subjective:  Dyspnea is better.  Not SOB still on oxygen.  Cr has increased and lasix cut back..  Objective:  Vital Signs in the last 24 hours: BP 90/46  Pulse 90  Temp(Src) 98.3 F (36.8 C) (Oral)  Resp 19  Ht 5' 3.5" (1.613 m)  Wt 69 kg (152 lb 1.9 oz)  BMI 26.52 kg/m2  SpO2 90%  Physical Exam: Pleasant elderly female in no acute distress Lungs:  Clear  Cardiac:  Regular rhythm, normal S1 and S2, no S3, 2/6 systolic murmur Abdomen:  Soft, nontender, no masses Extremities: No  edema present  Intake/Output from previous day: 10/01 0701 - 10/02 0700 In: 985 [P.O.:982; I.V.:3] Out: 2425 [Urine:2425] Weight Filed Weights   04/24/13 0455 04/25/13 0524 04/26/13 0424  Weight: 72.1 kg (158 lb 15.2 oz) 69.083 kg (152 lb 4.8 oz) 69 kg (152 lb 1.9 oz)    Lab Results: Basic Metabolic Panel:  Recent Labs  54/09/81 0930 04/26/13 0605  NA 138 139  K 4.0 3.5  CL 89* 88*  CO2 35* 39*  GLUCOSE 273* 90  BUN 30* 35*  CREATININE 1.67* 2.01*    CBC:  Recent Labs  04/23/13 1715 04/24/13 0651  WBC 7.2 7.3  NEUTROABS  --  4.3  HGB 12.4 11.7*  HCT 37.0 36.3  MCV 94.4 95.0  PLT 155 163    BNP    Component Value Date/Time   PROBNP 6054.0* 04/23/2013 2245    Telemetry: Sinus rhythm  Assessment/Plan:  1. Acute on chronic diastolic heart failure clinically improved 2. Mitral regurgitation 3. History of atrial fibrillation 4.Chronic kidney disease slight worse with diuresis.  Recommendations:  ECHO has still not been done. I called the echo lab and they will do it today.  Creatinine is up at this time and would stop IV diuresis     W. Ashley Royalty  MD [facility]  04/26/2013, 9:05 AM

## 2013-04-27 DIAGNOSIS — I4891 Unspecified atrial fibrillation: Secondary | ICD-10-CM

## 2013-04-27 DIAGNOSIS — I1310 Hypertensive heart and chronic kidney disease without heart failure, with stage 1 through stage 4 chronic kidney disease, or unspecified chronic kidney disease: Secondary | ICD-10-CM | POA: Diagnosis present

## 2013-04-27 LAB — GLUCOSE, CAPILLARY: Glucose-Capillary: 82 mg/dL (ref 70–99)

## 2013-04-27 LAB — PRO B NATRIURETIC PEPTIDE: Pro B Natriuretic peptide (BNP): 3072 pg/mL — ABNORMAL HIGH (ref 0–450)

## 2013-04-27 MED ORDER — BUMETANIDE 2 MG PO TABS: 2.0000 mg | ORAL_TABLET | Freq: Every day | ORAL | Status: DC

## 2013-04-27 MED ORDER — PROPRANOLOL HCL 20 MG PO TABS
20.0000 mg | ORAL_TABLET | Freq: Two times a day (BID) | ORAL | Status: DC
  Administered 2013-04-27: 09:00:00 20 mg via ORAL
  Filled 2013-04-27 (×2): qty 1

## 2013-04-27 NOTE — Progress Notes (Signed)
[name] to be D/C'd Home per MD order.  Discussed with the patient and all questions fully answered.    Medication List    STOP taking these medications       diltiazem 180 MG 24 hr capsule  Commonly known as:  CARDIZEM CD     moxifloxacin 400 MG tablet  Commonly known as:  AVELOX      TAKE these medications       amiodarone 200 MG tablet  Commonly known as:  PACERONE  Take 200 mg by mouth 3 (three) times a week. Take one tablet on Monday, Wednesday, and Friday     aspirin EC 81 MG tablet  Take 81 mg by mouth daily.     budesonide-formoterol 80-4.5 MCG/ACT inhaler  Commonly known as:  SYMBICORT  Inhale 2 puffs into the lungs 2 (two) times daily.     bumetanide 2 MG tablet  Commonly known as:  BUMEX  Take 1 tablet (2 mg total) by mouth daily.  Start taking on:  04/29/2013     chlorpheniramine-HYDROcodone 10-8 MG/5ML Lqcr  Commonly known as:  TUSSIONEX  Take 5 mLs by mouth every 12 (twelve) hours as needed (for cough).     cholecalciferol 1000 UNITS tablet  Commonly known as:  VITAMIN D  Take 1,000 Units by mouth daily.     colchicine 0.6 MG tablet  Take 0.6 mg by mouth daily as needed (for gout).     doxazosin 4 MG tablet  Commonly known as:  CARDURA  Take 4 mg by mouth at bedtime.     ferrous sulfate 325 (65 FE) MG tablet  Take 325 mg by mouth at bedtime.     fluticasone 50 MCG/ACT nasal spray  Commonly known as:  FLONASE  Place 2 sprays into the nose daily.     montelukast 10 MG tablet  Commonly known as:  SINGULAIR  Take 10 mg by mouth at bedtime. For shortness of breath     multivitamins ther. w/minerals Tabs tablet  Take 1 tablet by mouth daily.     omeprazole 20 MG capsule  Commonly known as:  PRILOSEC  Take 20 mg by mouth daily.     pravastatin 20 MG tablet  Commonly known as:  PRAVACHOL  Take 20 mg by mouth every evening.     propranolol 20 MG tablet  Commonly known as:  INDERAL  Take 20 mg by mouth 2 (two) times daily.     sodium chloride 0.65 % nasal spray  Commonly known as:  OCEAN  Place 1 spray into the nose as needed for congestion.     traMADol 50 MG tablet  Commonly known as:  ULTRAM  Take 50 mg by mouth every 8 (eight) hours as needed for pain. For muscle spasms. Maximum dose= 8 tablets per day        VVS, Skin clean, dry and intact without evidence of skin break down, no evidence of skin tears noted. IV catheter discontinued intact. Site without signs and symptoms of complications. Dressing and pressure applied.  An After Visit Summary was printed and given to the patient. Patient escorted via WC, and D/C home via private auto.  [name] 04/27/2013 1:10 PM

## 2013-04-27 NOTE — Discharge Summary (Addendum)
Physician Discharge Summary  [name]:[id number] DOB: [birth year] DOA: 04/23/2013  PCP: Cala Bradford, MD  Admit date: 04/23/2013 Discharge date: 04/27/2013  Time spent: 35 minutes  Recommendations for Outpatient Follow-up:  1. Follow up with Cardiology in 1 week.  BNP 3072  Estimated dry weight 69.9 kg.   Discharge Diagnoses:  Principal Problem:   Acute on chronic diastolic congestive heart failure Active Problems:   Hypertensive heart disease without CHF   Asthma   Atrial fibrillation   Chronic kidney disease stage 3   Pulmonary hypertension   Chronic diastolic congestive heart failure   Bradycardia by electrocardiogram   Cough   Cardiorenal syndrome with renal failure   Discharge Condition: stable  Diet recommendation: low sodium diet  Filed Weights   04/25/13 0524 04/26/13 0424 04/27/13 0626  Weight: 69.083 kg (152 lb 4.8 oz) 69 kg (152 lb 1.9 oz) 69.627 kg (153 lb 8 oz)    History of present illness:  79 y.o. female PMHx diastolic CHF, HTN, A. fib, asthma, prior PE, CVA, history GI diverticular bleed, CKD stage III. Presented with productive cough (yellow) and dyspnea x1 week, associated with generalized malaise. She denies fever, gi symptoms, or pain anywhere, and her symptoms were initially relieved by albuterol, however due to persistent productive cough which now has turned clear she was seen by her PCP today, found to have a HR in low 40s, BP stable, and hypoxic to 89%. She has had home O2 in the past due to CHF exacerbation, however has not recently needed o2. On arrival here from PCP, pt with improvement in symptoms after albuterol, EKG repeated and consistent with prior as above. Currently patient still having productive cough SOB at rest without 02.   Hospital Course:  Acute on chronic diastolic congestive heart failure:  - 72.0 kg  On admission ->69.0 kg on d/c, started on IV lasix change to oral home dose of bumex. ? Cardio-renal syndrome her  Cr on Admission was 1.96 came down to 1.6, then up to 2.0, this increase is most likely due to over diuresis. - negative 2.4 L in 24hrs. - Mild increase in Cr. Due to over diuresis, WIll need ACE-I as an outpatient.  - Check B-met and BNP in am.  - ECHO: estimated ejection fraction was in the range of 60% to 65%  AKI on chronic KD:  - Due to cardiorenal syndrome. On admission Cr 1.95. - improved with diuresis, then increase to 2.0 due to over diuresis. - baseline Cr. 1.6.  Hyperglycemia without a diagnosis of DM:  -  6.0 Hbg A1c.   Hypertensive heart disease without CHF  - cont lasix and Cardura.   Paroxysmal Atrial fibrillation  - On amiodarone. Diltiazem d'ed. - Sinus bradycardia.   Procedures:  Echo as above  Consultations:  cardiology  Discharge Exam: Filed Vitals:   04/27/13 0921  BP: 143/58  Pulse: 91  Temp:   Resp:     General: A&O x3 Cardiovascular: RRR Respiratory: good air movement CTA B/l  Discharge Instructions  Discharge Orders   Future Orders Complete By Expires   Diet - low sodium heart healthy  As directed    Increase activity slowly  As directed        Medication List    STOP taking these medications       moxifloxacin 400 MG tablet  Commonly known as:  AVELOX      TAKE these medications       amiodarone 200 MG  tablet  Commonly known as:  PACERONE  Take 200 mg by mouth 3 (three) times a week. Take one tablet on Monday, Wednesday, and Friday     aspirin EC 81 MG tablet  Take 81 mg by mouth daily.     budesonide-formoterol 80-4.5 MCG/ACT inhaler  Commonly known as:  SYMBICORT  Inhale 2 puffs into the lungs 2 (two) times daily.     bumetanide 2 MG tablet  Commonly known as:  BUMEX  Take 1 tablet (2 mg total) by mouth daily.  Start taking on:  04/29/2013     chlorpheniramine-HYDROcodone 10-8 MG/5ML Lqcr  Commonly known as:  TUSSIONEX  Take 5 mLs by mouth every 12 (twelve) hours as needed (for cough).     cholecalciferol  1000 UNITS tablet  Commonly known as:  VITAMIN D  Take 1,000 Units by mouth daily.     colchicine 0.6 MG tablet  Take 0.6 mg by mouth daily as needed (for gout).     diltiazem 180 MG 24 hr capsule  Commonly known as:  CARDIZEM CD  Take 180 mg by mouth daily.     doxazosin 4 MG tablet  Commonly known as:  CARDURA  Take 4 mg by mouth at bedtime.     ferrous sulfate 325 (65 FE) MG tablet  Take 325 mg by mouth at bedtime.     fluticasone 50 MCG/ACT nasal spray  Commonly known as:  FLONASE  Place 2 sprays into the nose daily.     montelukast 10 MG tablet  Commonly known as:  SINGULAIR  Take 10 mg by mouth at bedtime. For shortness of breath     multivitamins ther. w/minerals Tabs tablet  Take 1 tablet by mouth daily.     omeprazole 20 MG capsule  Commonly known as:  PRILOSEC  Take 20 mg by mouth daily.     pravastatin 20 MG tablet  Commonly known as:  PRAVACHOL  Take 20 mg by mouth every evening.     propranolol 20 MG tablet  Commonly known as:  INDERAL  Take 20 mg by mouth 2 (two) times daily.     sodium chloride 0.65 % nasal spray  Commonly known as:  OCEAN  Place 1 spray into the nose as needed for congestion.     traMADol 50 MG tablet  Commonly known as:  ULTRAM  Take 50 mg by mouth every 8 (eight) hours as needed for pain. For muscle spasms. Maximum dose= 8 tablets per day       Allergies  Allergen Reactions  . Lisinopril     REACTION: angioedema  . Primidone Other (See Comments)    No reaction noted  . Warfarin And Related     "GI BLEED"  . Clonidine Derivatives Rash       Follow-up Information   Follow up with [facility]. [facility] RN)    Contact information:   [address] [place] [address] [phone]       Follow up with Darden Palmer, MD In 1 week. (hospital follow up)    Specialty:  Cardiology   Contact information:   [address] [place] [address] [place] [address] [phone]         The results of significant diagnostics from this hospitalization (including imaging, microbiology, ancillary and laboratory) are listed below for reference.    Significant Diagnostic Studies: Dg Chest 2 View  04/23/2013   CLINICAL DATA:  Shortness of breath and cough  EXAM: CHEST  2 VIEW  COMPARISON:  05/15/2012  FINDINGS: Moderate cardiac enlargement. Pulmonary vascular congestion is noted. No edema or effusion. There is no airspace consolidation identified. Spondylosis is identified within the thoracic spine.  IMPRESSION: Cardiac enlargement and pulmonary venous congestion.   Electronically Signed   By: Signa Kell M.D.   On: 04/23/2013 16:34   Dg Chest Port 1 View  04/25/2013   CLINICAL DATA:  Cough, shortness of Breath.  EXAM: PORTABLE CHEST - 1 VIEW  COMPARISON:  04/23/2013  FINDINGS: Cardiomegaly. Minimal bibasilar atelectasis and small left effusion. No acute bony abnormality. Diffuse aortic calcifications.  IMPRESSION: Cardiomegaly, bibasilar atelectasis. Small left effusion.   Electronically Signed   By: Charlett Nose M.D.   On: 04/25/2013 07:00    Microbiology: No results found for this or any previous visit (from the past 240 hour(s)).   Labs: Basic Metabolic Panel:  Recent Labs Lab 04/23/13 1512 04/23/13 1715 04/24/13 0651 04/25/13 0930 04/26/13 0605 04/27/13 0630  NA  --  141 143 138 139  --   K  --  4.0 3.3* 4.0 3.5  --   CL  --  99 98 89* 88*  --   CO2  --  30 34* 35* 39*  --   GLUCOSE  --  136* 88 273* 90  --   BUN  --  40* 36* 30* 35*  --   CREATININE  --  1.95* 1.80* 1.67* 2.01*  --   CALCIUM  --  9.0 8.8 9.2 9.1  --   MG 2.0  --  1.9 1.8 1.9 1.8   Liver Function Tests:  Recent Labs Lab 04/24/13 0651  AST 19  ALT 11  ALKPHOS 104  BILITOT 0.8  PROT 6.4  ALBUMIN 3.5   No results found for this basename: LIPASE, AMYLASE,  in the last 168 hours No results found for this basename: AMMONIA,  in the last 168 hours CBC:  Recent Labs Lab  04/23/13 1715 04/24/13 0651  WBC 7.2 7.3  NEUTROABS  --  4.3  HGB 12.4 11.7*  HCT 37.0 36.3  MCV 94.4 95.0  PLT 155 163   Cardiac Enzymes: No results found for this basename: CKTOTAL, CKMB, CKMBINDEX, TROPONINI,  in the last 168 hours BNP: BNP (last 3 results)  Recent Labs  04/23/13 1715 04/23/13 2245  PROBNP 6275.0* 6054.0*   CBG:  Recent Labs Lab 04/26/13 0629 04/26/13 1108 04/26/13 1611 04/26/13 2103 04/27/13 0610  GLUCAP 90 220* 83 122* 94       Signed:  FELIZ ORTIZ, ABRAHAM  [facility] 04/27/2013, 10:46 AM

## 2013-04-27 NOTE — Progress Notes (Signed)
Subjective:  Dyspnea is better.  Dyspnea better and edema gone.  Weight down 9 pounds.  Is prob overdiuresed now. Not bradycardic.  Objective:  Vital Signs in the last 24 hours: BP 148/69  Pulse 107  Temp(Src) 97.9 F (36.6 C) (Oral)  Resp 20  Ht 5' 3.5" (1.613 m)  Wt 69.627 kg (153 lb 8 oz)  BMI 26.76 kg/m2  SpO2 95%  Physical Exam: Pleasant elderly female in no acute distress Lungs:  Clear  Cardiac:  Regular rhythm, normal S1 and S2, no S3, 2/6 systolic murmur Abdomen:  Soft, nontender, no masses Extremities: No  edema present  Intake/Output from previous day: 10/02 0701 - 10/03 0700 In: 480 [P.O.:480] Out: 751 [Urine:750; Stool:1] Weight Filed Weights   04/25/13 0524 04/26/13 0424 04/27/13 0626  Weight: 69.083 kg (152 lb 4.8 oz) 69 kg (152 lb 1.9 oz) 69.627 kg (153 lb 8 oz)    Lab Results: Basic Metabolic Panel:  Recent Labs  45/40/98 0930 04/26/13 0605  NA 138 139  K 4.0 3.5  CL 89* 88*  CO2 35* 39*  GLUCOSE 273* 90  BUN 30* 35*  CREATININE 1.67* 2.01*    CBC:   BNP    Component Value Date/Time   PROBNP 6054.0* 04/23/2013 2245    Telemetry: Sinus rhythm  Assessment/Plan:  1. Acute on chronic diastolic heart failure clinically improved 2. Mitral regurgitation 3. History of atrial fibrillation 4.Chronic kidney disease slight worse with diuresis. prob still dry.  Recommendations:  ECHO shows normal EF with diastolic dysfunction, moderate pulmonary hypertension.  She is prob euvolemic or dry, creatinine is increased a little.  From a cardiac viewpoint she could prob go home.  She still has some hypoxemia so may need to see if she will need O2 at home because of the pulmonary hypertension.  I would restart Inderal, but discontinue the diltiazem. F/u with me in 1-2 weeks.  She should weigh daily and take extra Bumex if her weight goes up.     [name] Palmer  MD [facility]  04/27/2013, 8:18 AM

## 2013-04-27 NOTE — ED Provider Notes (Addendum)
I saw and evaluated the patient, reviewed the resident's note and I agree with the findings and plan. Patient is a 79 year old female who presents with shortness of breath x1 week. She has a history of CHF, asthma and prior PE. She is persistently bradycardic and hypoxic in the emergency department. She appears to be having a CHF exacerbation.   Loren Racer, MD 04/27/13 [phone]  I have reviewed and agree with resident EKG interpretation.  Loren Racer, MD 05/17/13 [phone]

## 2013-07-30 ENCOUNTER — Encounter (HOSPITAL_COMMUNITY): Payer: Self-pay | Admitting: Emergency Medicine

## 2013-07-30 ENCOUNTER — Inpatient Hospital Stay (HOSPITAL_COMMUNITY)
Admission: EM | Admit: 2013-07-30 | Discharge: 2013-08-03 | DRG: 291 | Disposition: A | Discharge status: Home / Self Care | Payer: Medicare Other | Attending: Family Medicine | Admitting: Family Medicine

## 2013-07-30 ENCOUNTER — Emergency Department (HOSPITAL_COMMUNITY): Payer: Medicare Other

## 2013-07-30 DIAGNOSIS — E785 Hyperlipidemia, unspecified: Secondary | ICD-10-CM | POA: Diagnosis present

## 2013-07-30 DIAGNOSIS — J9600 Acute respiratory failure, unspecified whether with hypoxia or hypercapnia: Secondary | ICD-10-CM | POA: Diagnosis present

## 2013-07-30 DIAGNOSIS — N189 Chronic kidney disease, unspecified: Secondary | ICD-10-CM

## 2013-07-30 DIAGNOSIS — Z8673 Personal history of transient ischemic attack (TIA), and cerebral infarction without residual deficits: Secondary | ICD-10-CM

## 2013-07-30 DIAGNOSIS — I272 Pulmonary hypertension, unspecified: Secondary | ICD-10-CM

## 2013-07-30 DIAGNOSIS — I509 Heart failure, unspecified: Secondary | ICD-10-CM | POA: Diagnosis present

## 2013-07-30 DIAGNOSIS — I4891 Unspecified atrial fibrillation: Secondary | ICD-10-CM

## 2013-07-30 DIAGNOSIS — I5033 Acute on chronic diastolic (congestive) heart failure: Principal | ICD-10-CM | POA: Diagnosis present

## 2013-07-30 DIAGNOSIS — N183 Chronic kidney disease, stage 3 unspecified: Secondary | ICD-10-CM | POA: Diagnosis present

## 2013-07-30 DIAGNOSIS — Z86711 Personal history of pulmonary embolism: Secondary | ICD-10-CM

## 2013-07-30 DIAGNOSIS — K219 Gastro-esophageal reflux disease without esophagitis: Secondary | ICD-10-CM | POA: Diagnosis present

## 2013-07-30 DIAGNOSIS — I2789 Other specified pulmonary heart diseases: Secondary | ICD-10-CM | POA: Diagnosis present

## 2013-07-30 DIAGNOSIS — J45909 Unspecified asthma, uncomplicated: Secondary | ICD-10-CM | POA: Diagnosis present

## 2013-07-30 DIAGNOSIS — I119 Hypertensive heart disease without heart failure: Secondary | ICD-10-CM | POA: Diagnosis present

## 2013-07-30 DIAGNOSIS — I130 Hypertensive heart and chronic kidney disease with heart failure and stage 1 through stage 4 chronic kidney disease, or unspecified chronic kidney disease: Secondary | ICD-10-CM | POA: Diagnosis present

## 2013-07-30 DIAGNOSIS — N2581 Secondary hyperparathyroidism of renal origin: Secondary | ICD-10-CM | POA: Diagnosis present

## 2013-07-30 DIAGNOSIS — J189 Pneumonia, unspecified organism: Secondary | ICD-10-CM | POA: Diagnosis present

## 2013-07-30 DIAGNOSIS — J069 Acute upper respiratory infection, unspecified: Secondary | ICD-10-CM | POA: Diagnosis present

## 2013-07-30 HISTORY — DX: Cerebral infarction, unspecified: I63.9

## 2013-07-30 HISTORY — DX: Shortness of breath: R06.02

## 2013-07-30 HISTORY — DX: Chronic obstructive pulmonary disease, unspecified: J44.9

## 2013-07-30 HISTORY — DX: Migraine, unspecified, not intractable, without status migrainosus: G43.909

## 2013-07-30 LAB — CBC
HCT: 37.6 % (ref 36.0–46.0)
Hemoglobin: 12.5 g/dL (ref 12.0–15.0)
MCH: 32.2 pg (ref 26.0–34.0)
MCHC: 33.2 g/dL (ref 30.0–36.0)
MCV: 96.9 fL (ref 78.0–100.0)
Platelets: 164 K/uL (ref 150–400)
RBC: 3.88 MIL/uL (ref 3.87–5.11)
RDW: 15.2 % (ref 11.5–15.5)
WBC: 8.6 10*3/uL (ref 4.0–10.5)

## 2013-07-30 LAB — BASIC METABOLIC PANEL
CO2: 32 mEq/L (ref 19–32)
Chloride: 95 mEq/L — ABNORMAL LOW (ref 96–112)
Creatinine, Ser: 1.82 mg/dL — ABNORMAL HIGH (ref 0.50–1.10)
GFR calc Af Amer: 27 mL/min — ABNORMAL LOW (ref >90)
Potassium: 3.5 mEq/L — ABNORMAL LOW (ref 3.7–5.3)
Sodium: 140 mEq/L (ref 137–147)

## 2013-07-30 LAB — POCT I-STAT TROPONIN I: Troponin i, poc: 0.04 ng/mL (ref 0.00–0.08)

## 2013-07-30 LAB — PRO B NATRIURETIC PEPTIDE: PRO B NATRI PEPTIDE: 11846 pg/mL — AB (ref 0–450)

## 2013-07-30 LAB — BASIC METABOLIC PANEL WITH GFR
BUN: 52 mg/dL — ABNORMAL HIGH (ref 6–23)
Calcium: 8.6 mg/dL (ref 8.4–10.5)
GFR calc non Af Amer: 23 mL/min — ABNORMAL LOW (ref >90)
Glucose, Bld: 105 mg/dL — ABNORMAL HIGH (ref 70–99)

## 2013-07-30 MED ORDER — ONDANSETRON HCL 4 MG PO TABS: 4.0000 mg | ORAL_TABLET | Freq: Four times a day (QID) | ORAL | Status: DC | PRN

## 2013-07-30 MED ORDER — FERROUS SULFATE 325 (65 FE) MG PO TABS
325.0000 mg | ORAL_TABLET | Freq: Every day | ORAL | Status: DC
  Administered 2013-07-30 – 2013-08-02 (×4): 325 mg via ORAL
  Filled 2013-07-30 (×5): qty 1

## 2013-07-30 MED ORDER — ASPIRIN EC 81 MG PO TBEC
81.0000 mg | DELAYED_RELEASE_TABLET | Freq: Every day | ORAL | Status: DC
  Administered 2013-07-30 – 2013-08-03 (×5): 81 mg via ORAL
  Filled 2013-07-30 (×5): qty 1

## 2013-07-30 MED ORDER — ACETAMINOPHEN 325 MG PO TABS
650.0000 mg | ORAL_TABLET | Freq: Four times a day (QID) | ORAL | Status: DC | PRN
  Administered 2013-08-02: 650 mg via ORAL
  Filled 2013-07-30: qty 2

## 2013-07-30 MED ORDER — HYDROCOD POLST-CHLORPHEN POLST 10-8 MG/5ML PO LQCR
5.0000 mL | Freq: Two times a day (BID) | ORAL | Status: DC | PRN
  Administered 2013-07-30: 5 mL via ORAL
  Filled 2013-07-30: qty 5

## 2013-07-30 MED ORDER — PROPRANOLOL HCL 20 MG PO TABS
20.0000 mg | ORAL_TABLET | Freq: Two times a day (BID) | ORAL | Status: DC
  Administered 2013-07-30: 22:00:00 20 mg via ORAL
  Filled 2013-07-30 (×3): qty 1

## 2013-07-30 MED ORDER — ALBUTEROL SULFATE (2.5 MG/3ML) 0.083% IN NEBU: 2.5000 mg | INHALATION_SOLUTION | Freq: Four times a day (QID) | RESPIRATORY_TRACT | Status: DC | PRN

## 2013-07-30 MED ORDER — FUROSEMIDE 10 MG/ML IJ SOLN: 40.0000 mg | Freq: Once | INTRAMUSCULAR | Status: DC

## 2013-07-30 MED ORDER — SIMVASTATIN 10 MG PO TABS
10.0000 mg | ORAL_TABLET | Freq: Every day | ORAL | Status: DC
  Administered 2013-07-31 – 2013-08-02 (×3): 10 mg via ORAL
  Filled 2013-07-30 (×4): qty 1

## 2013-07-30 MED ORDER — CEFTRIAXONE SODIUM 1 G IJ SOLR
1.0000 g | Freq: Once | INTRAMUSCULAR | Status: AC
  Administered 2013-07-30: 1 g via INTRAVENOUS
  Filled 2013-07-30: qty 10

## 2013-07-30 MED ORDER — AMIODARONE HCL 200 MG PO TABS
200.0000 mg | ORAL_TABLET | ORAL | Status: DC
  Administered 2013-07-30 – 2013-08-03 (×3): 200 mg via ORAL
  Filled 2013-07-30 (×3): qty 1

## 2013-07-30 MED ORDER — FUROSEMIDE 10 MG/ML IJ SOLN
80.0000 mg | Freq: Once | INTRAMUSCULAR | Status: AC
  Administered 2013-07-30: 80 mg via INTRAVENOUS
  Filled 2013-07-30: qty 8

## 2013-07-30 MED ORDER — SODIUM CHLORIDE 0.9 % IJ SOLN
3.0000 mL | Freq: Two times a day (BID) | INTRAMUSCULAR | Status: DC
  Administered 2013-07-30 – 2013-08-03 (×8): 3 mL via INTRAVENOUS

## 2013-07-30 MED ORDER — VITAMIN D3 25 MCG (1000 UNIT) PO TABS
1000.0000 [IU] | ORAL_TABLET | Freq: Every day | ORAL | Status: DC
  Administered 2013-07-30 – 2013-08-03 (×5): 1000 [IU] via ORAL
  Filled 2013-07-30 (×5): qty 1

## 2013-07-30 MED ORDER — ONDANSETRON HCL 4 MG/2ML IJ SOLN: 4.0000 mg | Freq: Four times a day (QID) | INTRAMUSCULAR | Status: DC | PRN

## 2013-07-30 MED ORDER — DEXTROSE 5 % IV SOLN
500.0000 mg | Freq: Once | INTRAVENOUS | Status: AC
  Administered 2013-07-30: 500 mg via INTRAVENOUS

## 2013-07-30 MED ORDER — IPRATROPIUM BROMIDE 0.02 % IN SOLN
0.5000 mg | Freq: Three times a day (TID) | RESPIRATORY_TRACT | Status: DC
  Administered 2013-07-30 – 2013-07-31 (×2): 0.5 mg via RESPIRATORY_TRACT
  Filled 2013-07-30: qty 2.5

## 2013-07-30 MED ORDER — HEPARIN SODIUM (PORCINE) 5000 UNIT/ML IJ SOLN
5000.0000 [IU] | Freq: Three times a day (TID) | INTRAMUSCULAR | Status: DC
  Administered 2013-07-30 – 2013-08-03 (×11): 5000 [IU] via SUBCUTANEOUS
  Filled 2013-07-30 (×14): qty 1

## 2013-07-30 MED ORDER — PANTOPRAZOLE SODIUM 40 MG PO TBEC
40.0000 mg | DELAYED_RELEASE_TABLET | Freq: Every day | ORAL | Status: DC
  Administered 2013-07-30 – 2013-08-03 (×5): 40 mg via ORAL
  Filled 2013-07-30 (×4): qty 1

## 2013-07-30 MED ORDER — ACETAMINOPHEN 650 MG RE SUPP: 650.0000 mg | Freq: Four times a day (QID) | RECTAL | Status: DC | PRN

## 2013-07-30 MED ORDER — HYDRALAZINE HCL 25 MG PO TABS
25.0000 mg | ORAL_TABLET | Freq: Two times a day (BID) | ORAL | Status: DC
  Administered 2013-07-30 – 2013-08-03 (×8): 25 mg via ORAL
  Filled 2013-07-30 (×9): qty 1

## 2013-07-30 MED ORDER — BUDESONIDE-FORMOTEROL FUMARATE 80-4.5 MCG/ACT IN AERO
2.0000 | INHALATION_SPRAY | Freq: Two times a day (BID) | RESPIRATORY_TRACT | Status: DC
  Administered 2013-07-31 – 2013-08-03 (×5): 2 via RESPIRATORY_TRACT
  Filled 2013-07-30 (×2): qty 6.9

## 2013-07-30 MED ORDER — ALBUTEROL SULFATE (2.5 MG/3ML) 0.083% IN NEBU
5.0000 mg | INHALATION_SOLUTION | Freq: Once | RESPIRATORY_TRACT | Status: AC
  Administered 2013-07-30: 5 mg via RESPIRATORY_TRACT
  Filled 2013-07-30: qty 6

## 2013-07-30 MED ORDER — FUROSEMIDE 10 MG/ML IJ SOLN
40.0000 mg | Freq: Two times a day (BID) | INTRAMUSCULAR | Status: DC
  Administered 2013-07-30 – 2013-08-02 (×6): 40 mg via INTRAVENOUS
  Filled 2013-07-30 (×7): qty 4

## 2013-07-30 MED ORDER — DOXAZOSIN MESYLATE 2 MG PO TABS
2.0000 mg | ORAL_TABLET | Freq: Every day | ORAL | Status: DC
  Administered 2013-07-30 – 2013-08-02 (×4): 2 mg via ORAL
  Filled 2013-07-30 (×5): qty 1

## 2013-07-30 MED ORDER — MONTELUKAST SODIUM 10 MG PO TABS
10.0000 mg | ORAL_TABLET | Freq: Every day | ORAL | Status: DC
  Administered 2013-07-30 – 2013-08-02 (×4): 10 mg via ORAL
  Filled 2013-07-30 (×5): qty 1

## 2013-07-30 NOTE — H&P (Signed)
[facility] History and Physical  [name] ZOX:[id number] DOB: [birth year] DOA: 07/30/2013  Referring physician:  PCP: Cala Bradford, MD  Specialists:   Chief Complaint: SOB, cough   HPI: [name] is a 80 y.o. female with PMH of HTN, HPL, CHF, GERD, prior h/o PE, PAF, GIB, asthma presented with worsening SOB, wheezing, congestion, productive cough for 2 weeks. No history of fevers, but had some chills, night sweeats; denies nausea, vomiting or diarrhea;  -ED found to have CHF, PNA   Review of Systems: The patient denies anorexia, fever, weight loss,, vision loss, decreased hearing, hoarseness, chest pain, syncope, , balance deficits, hemoptysis, abdominal pain, melena, hematochezia, severe indigestion/heartburn, hematuria, incontinence, genital sores, muscle weakness, suspicious skin lesions, transient blindness, difficulty walking, depression, unusual weight change, abnormal bleeding, enlarged lymph nodes, angioedema, and breast masses.    Past Medical History  Diagnosis Date  . Acute on chronic diastolic congestive heart failure 05/28/2011  . Hypertensive heart disease without CHF 05/28/2011  . History of pulmonary embolus (PE) 05/28/2011    2006 following pelvic fracture  . Chronic venous insufficiency 05/28/2011  . Hyperlipidemia 05/28/2011  . Gout 05/28/2011  . Asthma 05/28/2011  . History of GI diverticular bleed 05/28/2011    June 2012  . Atrial fibrillation 05/28/2011  . Chronic kidney disease stage 3 05/28/2011  . Kidney stones   . Osteoporosis   . Pulmonary hypertension 05/28/2011  . GERD (gastroesophageal reflux disease) 05/28/2011  . Mitral regurgitation 05/31/2011  . Secondary hyperparathyroidism (of renal origin)   . Anemia of chronic disease   . History of CVA (cerebrovascular accident)   . Chronic diastolic congestive heart failure   . Hypertension   . Pneumonia   . Arthritis    Past Surgical History  Procedure Laterality Date  . Partial  colectomy      1999 following colonoscopy complication  . Left hip replacement      2004, complicated with dislocation  . Right hip replacement      2005  . Hernia repair    . Bilateral cataract surgery    . Colon surgery     Social History:  reports that she has never smoked. She has never used smokeless tobacco. She reports that she does not drink alcohol. Her drug history is not on file. Home;  where does patient live--home, ALF, SNF? and with whom if at home? Yes;  Can patient participate in ADLs?  Allergies  Allergen Reactions  . Lisinopril     REACTION: angioedema  . Primidone Other (See Comments)    No reaction noted  . Warfarin And Related     "GI BLEED"  . Clonidine Derivatives Rash    History reviewed. No pertinent family history.  (be sure to complete)  Prior to Admission medications   Medication Sig Start Date End Date Taking? Authorizing Provider  amiodarone (PACERONE) 200 MG tablet Take 200 mg by mouth 3 (three) times a week. Take one tablet on Monday, Wednesday, and Friday    Yes Historical Provider, MD  aspirin EC 81 MG tablet Take 81 mg by mouth daily.   Yes Historical Provider, MD  budesonide-formoterol (SYMBICORT) 80-4.5 MCG/ACT inhaler Inhale 2 puffs into the lungs 2 (two) times daily.    Yes Historical Provider, MD  bumetanide (BUMEX) 2 MG tablet Take 1 tablet (2 mg total) by mouth daily. 04/29/13  Yes Marinda Elk, MD  chlorpheniramine-HYDROcodone (TUSSIONEX) 10-8 MG/5ML LQCR Take 5 mLs by mouth every 12 (twelve) hours as  needed (for cough).  04/17/13  Yes Historical Provider, MD  cholecalciferol (VITAMIN D) 1000 UNITS tablet Take 1,000 Units by mouth daily.     Yes Historical Provider, MD  colchicine 0.6 MG tablet Take 0.6 mg by mouth daily as needed (for gout).    Yes Historical Provider, MD  doxazosin (CARDURA) 4 MG tablet Take 4 mg by mouth at bedtime.     Yes Historical Provider, MD  ferrous sulfate 325 (65 FE) MG tablet Take 325 mg by mouth at  bedtime.    Yes Historical Provider, MD  fluticasone (FLONASE) 50 MCG/ACT nasal spray Place 2 sprays into the nose daily.   Yes Historical Provider, MD  hydrALAZINE (APRESOLINE) 25 MG tablet Take 25 mg by mouth 2 (two) times daily. 07/03/13  Yes Historical Provider, MD  ipratropium (ATROVENT) 0.02 % nebulizer solution Take 0.5 mg by nebulization 3 (three) times daily.   Yes Historical Provider, MD  montelukast (SINGULAIR) 10 MG tablet Take 10 mg by mouth at bedtime. For shortness of breath   Yes Historical Provider, MD  Multiple Vitamins-Minerals (MULTIVITAMINS THER. W/MINERALS) TABS Take 1 tablet by mouth daily.     Yes Historical Provider, MD  omeprazole (PRILOSEC) 20 MG capsule Take 20 mg by mouth daily.     Yes Historical Provider, MD  pravastatin (PRAVACHOL) 20 MG tablet Take 20 mg by mouth every evening.     Yes Historical Provider, MD  sodium chloride (OCEAN) 0.65 % nasal spray Place 1 spray into the nose as needed for congestion.   Yes Historical Provider, MD  traMADol (ULTRAM) 50 MG tablet Take 50 mg by mouth every 8 (eight) hours as needed for pain. For muscle spasms. Maximum dose= 8 tablets per day   Yes Historical Provider, MD  propranolol (INDERAL) 20 MG tablet Take 20 mg by mouth 2 (two) times daily.  04/17/13   Historical Provider, MD   Physical Exam: Filed Vitals:   07/30/13 1715  BP: 159/64  Pulse: 59  Temp:   Resp: 13     General:  alert  Eyes: eom-i, perrla   ENT: no oral ulcers   Neck: supple   Cardiovascular: s1,s2 rrr  Respiratory: LL crackles,   Abdomen: sooft, nt, nd   Skin: no rash  Musculoskeletal: LE edema  Psychiatric: no hallucinatiosn   Neurologic: CN 2-12 intact   Labs on Admission:  Basic Metabolic Panel:  Recent Labs Lab 07/30/13 1359  NA 140  K 3.5*  CL 95*  CO2 32  GLUCOSE 105*  BUN 52*  CREATININE 1.82*  CALCIUM 8.6   Liver Function Tests: No results found for this basename: AST, ALT, ALKPHOS, BILITOT, PROT, ALBUMIN,  in  the last 168 hours No results found for this basename: LIPASE, AMYLASE,  in the last 168 hours No results found for this basename: AMMONIA,  in the last 168 hours CBC:  Recent Labs Lab 07/30/13 1359  WBC 8.6  HGB 12.5  HCT 37.6  MCV 96.9  PLT 164   Cardiac Enzymes: No results found for this basename: CKTOTAL, CKMB, CKMBINDEX, TROPONINI,  in the last 168 hours  BNP (last 3 results)  Recent Labs  04/23/13 2245 04/27/13 1140 07/30/13 1500  PROBNP 6054.0* 3072.0* 11846.0*   CBG: No results found for this basename: GLUCAP,  in the last 168 hours  Radiological Exams on Admission: Dg Chest 2 View (if Patient Has Fever And/or Copd)  07/30/2013   CLINICAL DATA:  Cough and congestion. Wheezing. High blood pressure.  EXAM: CHEST  2 VIEW  COMPARISON:  04/25/2013.  FINDINGS: Cardiomegaly with pulmonary vascular congestion most notable centrally.  Calcified tortuous aorta.  Suggestion of subtle infiltrate right lung base. Attention to this on follow up.  IMPRESSION: Suggestion of subtle infiltrate right lung base. Attention to this on follow up.  Cardiomegaly with pulmonary vascular congestion most notable centrally.  Calcified tortuous aorta.   Electronically Signed   By: Bridgett Larsson M.D.   On: 07/30/2013 14:33    EKG: Independently reviewed. Pend   Assessment/Plan Principal Problem:   Pneumonia Active Problems:   Hypertensive heart disease without CHF   Acute on chronic diastolic congestive heart failure   80 y.o. female with PMH of HTN, HPL, CHF, GERD, prior h/o PE, PAF, GIB, asthma presented with worsening SOB, wheezing, congestion, productive cough for 2 weeks; admitted with CHF, PNA  1. Acute on chronic CHF; CXR: congestion; echo (04/26/13); LVEF 60%, severe LVH, dilated LA, pulmon HTN -started IV diuresis, close monitor I/O, daily weight; expect worsening renal function   2. Pneumonia; CXR: R lung infiltrate  -cont IV atx, f/u cultures   3. Acute respiratory failure,  hypoxia due to CHF/PNA; cont as above   4. HTN cont home meds   5. H/o PAF; cont amiodarone, not on AC presumable due to GIB; check ECG;   6. CKD III, close monitor while on diuretics   None;  if consultant consulted, please document name and whether formally or informally consulted  Code Status: full (must indicate code status--if unknown or must be presumed, indicate so) Family Communication: d/w patient, family at the bedside (indicate person spoken with, if applicable, with phone number if by telephone) Disposition Plan: home when improved  (indicate anticipated LOS)  Time spent: >35 minutes   Esperanza Sheets [facility] Pager [phone]  If 7PM-7AM, please contact night-coverage [email or web address] Password [facility] 07/30/2013, 5:44 PM

## 2013-07-30 NOTE — ED Notes (Signed)
Per daughter, patient is having trouble swallowing, and is very weak, pt reports cough but denies pain.

## 2013-07-30 NOTE — Progress Notes (Signed)
Unit CM UR Completed by [facility] ED CM  W. Jessicia Napolitano RN  

## 2013-07-30 NOTE — ED Provider Notes (Signed)
CSN: [id number]     Arrival date & time 07/30/13  1339 History   First MD Initiated Contact with Patient 07/30/13 1418     Chief Complaint  Patient presents with  . Wheezing  . URI   (Consider location/radiation/quality/duration/timing/severity/associated sxs/prior Treatment) Patient is a 80 y.o. female presenting with wheezing, URI, and shortness of breath.  Wheezing Severity:  Moderate Associated symptoms: cough and shortness of breath   Associated symptoms: no chest pain, no fever, no headaches, no rash and no sore throat   URI Presenting symptoms: cough   Presenting symptoms: no fever and no sore throat   Associated symptoms: wheezing   Associated symptoms: no headaches   Shortness of Breath Severity:  Moderate Onset quality:  Gradual Duration:  2 weeks Timing:  Constant Progression:  Worsening Chronicity:  Recurrent Ineffective treatments:  Inhaler (antibiotics) Associated symptoms: cough and wheezing   Associated symptoms: no abdominal pain, no chest pain, no fever, no headaches, no rash, no sore throat and no vomiting   Risk factors comment:  Hx of CHF   Past Medical History  Diagnosis Date  . Acute on chronic diastolic congestive heart failure 05/28/2011  . Hypertensive heart disease without CHF 05/28/2011  . History of pulmonary embolus (PE) 05/28/2011    2006 following pelvic fracture  . Chronic venous insufficiency 05/28/2011  . Hyperlipidemia 05/28/2011  . Gout 05/28/2011    "had it in my feet" (07/30/2013)  . Asthma 05/28/2011  . History of GI diverticular bleed 05/28/2011    June 2012  . Atrial fibrillation 05/28/2011  . Osteoporosis   . Pulmonary hypertension 05/28/2011  . GERD (gastroesophageal reflux disease) 05/28/2011  . Mitral regurgitation 05/31/2011  . Secondary hyperparathyroidism (of renal origin)   . Anemia of chronic disease   . Chronic diastolic congestive heart failure   . Hypertension   . COPD (chronic obstructive pulmonary disease)   . Pneumonia  ?l;07/30/2013    "now; had it once before" (07/30/2013  . Shortness of breath     "can come on at anytime" (07/30/2013)  . Migraines     "had them when I was young; none in a long time" (07/30/2013)  . CVA (cerebral vascular accident)     "mini; long time ago"; denies residual on 07/30/2013  . Arthritis     "fingers" (07/30/2013)  . Chronic kidney disease stage 3 05/28/2011   Past Surgical History  Procedure Laterality Date  . Partial colectomy      1999 following colonoscopy complication  . Total hip arthroplasty Left 2004    complicated with dislocation ("put it in place without surgery")  . Total hip arthroplasty Right 2005  . Cataract extraction w/ intraocular lens  implant, bilateral Bilateral   . Colon resection  1999    secondary to puncture of colon during colonoscopy/notes 10/10/2002 (07/30/2013)  . Inguinal hernia repair Right 1960's?   History reviewed. No pertinent family history. History  Substance Use Topics  . Smoking status: Never Smoker   . Smokeless tobacco: Never Used  . Alcohol Use: No   OB History   Grav Para Term Preterm Abortions TAB SAB Ect Mult Living                 Review of Systems  Constitutional: Negative for fever and chills.  HENT: Negative for sore throat.   Eyes: Negative for pain.  Respiratory: Positive for cough, shortness of breath and wheezing.   Cardiovascular: Positive for leg swelling. Negative for chest pain.  Gastrointestinal:  Negative for nausea, vomiting, abdominal pain and diarrhea.  Genitourinary: Negative for dysuria.  Musculoskeletal: Negative for back pain.  Skin: Negative for rash.  Neurological: Negative for numbness and headaches.    Allergies  Lisinopril; Primidone; Warfarin and related; and Clonidine derivatives  Home Medications   No current outpatient prescriptions on file. BP 138/76  Pulse 70  Temp(Src) 98.2 F (36.8 C) (Oral)  Resp 22  Ht 5\' 2"  (1.575 m)  Wt 339 lb 11.7 oz (154.1 kg)  BMI 62.12 kg/m2  SpO2  94% Physical Exam  Constitutional: She is oriented to person, place, and time. She appears well-developed and well-nourished. No distress.  HENT:  Head: Normocephalic and atraumatic.  Eyes: Pupils are equal, round, and reactive to light. Right eye exhibits no discharge. Left eye exhibits no discharge.  Neck: Normal range of motion.  Cardiovascular: Normal rate, regular rhythm and normal heart sounds.   Pulmonary/Chest: Effort normal. Not tachypneic. No respiratory distress. She has wheezes. She has rhonchi.  Abdominal: Soft. She exhibits no distension. There is no tenderness.  Musculoskeletal: Normal range of motion.  Neurological: She is alert and oriented to person, place, and time.  Skin: Skin is warm. She is not diaphoretic.    ED Course  Procedures (including critical care time) Labs Review Labs Reviewed  BASIC METABOLIC PANEL - Abnormal; Notable for the following:    Potassium 3.5 (*)    Chloride 95 (*)    Glucose, Bld 105 (*)    BUN 52 (*)    Creatinine, Ser 1.82 (*)    GFR calc non Af Amer 23 (*)    GFR calc Af Amer 27 (*)    All other components within normal limits  PRO B NATRIURETIC PEPTIDE - Abnormal; Notable for the following:    Pro B Natriuretic peptide (BNP) 11846.0 (*)    All other components within normal limits  CULTURE, BLOOD (ROUTINE X 2)  CULTURE, BLOOD (ROUTINE X 2)  CBC  CBC  BASIC METABOLIC PANEL  POCT I-STAT TROPONIN I   Imaging Review Dg Chest 2 View (if Patient Has Fever And/or Copd)  07/30/2013   CLINICAL DATA:  Cough and congestion. Wheezing. High blood pressure.  EXAM: CHEST  2 VIEW  COMPARISON:  04/25/2013.  FINDINGS: Cardiomegaly with pulmonary vascular congestion most notable centrally.  Calcified tortuous aorta.  Suggestion of subtle infiltrate right lung base. Attention to this on follow up.  IMPRESSION: Suggestion of subtle infiltrate right lung base. Attention to this on follow up.  Cardiomegaly with pulmonary vascular congestion most  notable centrally.  Calcified tortuous aorta.   Electronically Signed   By: Bridgett LarssonSteve  Olson M.D.   On: 07/30/2013 14:33    EKG Interpretation    Date/Time:    Ventricular Rate:    PR Interval:    QRS Duration:   QT Interval:    QTC Calculation:   R Axis:     Text Interpretation:              MDM   1. Community acquired pneumonia   2. CHF exacerbation   3. Acute on chronic diastolic congestive heart failure   4. Atrial fibrillation   5. Pulmonary hypertension     80 yo F with hx of CHF exacerbations, admitted in 03/2013 for the same, who presents for two weeks of worsening SOB, wheezing, congestion for 2 weeks. No history of fevers, nausea, vomiting. No chest pain.   Given exam, concern for infectious process vs CHF. Will obtain basic  labs, CXR.    CXR demonstrates vascular congestion with cardiomegaly. Will follow-up BNP, other lab results. CXR also demonstrates possible R sided pulmonary infiltrates. Given cough, congestion, hypoxia at outside clinic, will pull blood cultures, start antibiotics. Anticipate admission to hospitalist service. Admitted to hospitalist in stable condition. Patient seen and evaluated by myself and my attending, Dr. Gwendolyn Grant.      Imagene Sheller, MD 07/30/13 [phone]

## 2013-07-30 NOTE — ED Notes (Signed)
Dinner tray ordered.

## 2013-07-30 NOTE — ED Notes (Signed)
Pt sent here from md office for wheezing and airway congestion, sats running in low 90's here and per daughter below 90 at home

## 2013-07-31 LAB — BASIC METABOLIC PANEL
BUN: 46 mg/dL — AB (ref 6–23)
CHLORIDE: 93 meq/L — AB (ref 96–112)
CO2: 37 mEq/L — ABNORMAL HIGH (ref 19–32)
Calcium: 8.4 mg/dL (ref 8.4–10.5)
Creatinine, Ser: 1.85 mg/dL — ABNORMAL HIGH (ref 0.50–1.10)
GFR calc Af Amer: 27 mL/min — ABNORMAL LOW (ref >90)
GFR calc non Af Amer: 23 mL/min — ABNORMAL LOW (ref >90)
Glucose, Bld: 101 mg/dL — ABNORMAL HIGH (ref 70–99)
POTASSIUM: 3.3 meq/L — AB (ref 3.7–5.3)
Sodium: 143 mEq/L (ref 137–147)

## 2013-07-31 LAB — CBC
HCT: 34.6 % — ABNORMAL LOW (ref 36.0–46.0)
Hemoglobin: 11 g/dL — ABNORMAL LOW (ref 12.0–15.0)
MCH: 31.2 pg (ref 26.0–34.0)
MCHC: 31.8 g/dL (ref 30.0–36.0)
MCV: 98 fL (ref 78.0–100.0)
Platelets: 158 10*3/uL (ref 150–400)
RBC: 3.53 MIL/uL — ABNORMAL LOW (ref 3.87–5.11)
RDW: 15.2 % (ref 11.5–15.5)
WBC: 6.3 10*3/uL (ref 4.0–10.5)

## 2013-07-31 LAB — INFLUENZA PANEL BY PCR (TYPE A & B)
H1N1FLUPCR: NOT DETECTED
Influenza A By PCR: NEGATIVE
Influenza B By PCR: NEGATIVE

## 2013-07-31 MED ORDER — IPRATROPIUM-ALBUTEROL 0.5-2.5 (3) MG/3ML IN SOLN
3.0000 mL | Freq: Three times a day (TID) | RESPIRATORY_TRACT | Status: DC
  Administered 2013-07-31 – 2013-08-03 (×10): 3 mL via RESPIRATORY_TRACT
  Filled 2013-07-31 (×31): qty 3

## 2013-07-31 MED ORDER — PROPRANOLOL HCL 10 MG PO TABS
10.0000 mg | ORAL_TABLET | Freq: Two times a day (BID) | ORAL | Status: DC
  Administered 2013-07-31 – 2013-08-03 (×7): 10 mg via ORAL
  Filled 2013-07-31 (×8): qty 1

## 2013-07-31 MED ORDER — LEVOFLOXACIN IN D5W 750 MG/150ML IV SOLN: 750.0000 mg | INTRAVENOUS | Status: DC

## 2013-07-31 MED ORDER — LEVOFLOXACIN IN D5W 750 MG/150ML IV SOLN
750.0000 mg | Freq: Once | INTRAVENOUS | Status: AC
  Administered 2013-07-31: 01:00:00 750 mg via INTRAVENOUS
  Filled 2013-07-31: qty 150

## 2013-07-31 MED ORDER — LEVOFLOXACIN IN D5W 500 MG/100ML IV SOLN
500.0000 mg | INTRAVENOUS | Status: DC
  Filled 2013-07-31: qty 100

## 2013-07-31 NOTE — Progress Notes (Addendum)
80yo female c/o wheezing and airway congestion, sats in low 90s on arrival, CXR suggests infiltrate, to begin IV ABX.  Will give Levaquin 750mg  IV x1 now then 500mg  IV Q48H for CrCl ~20 ml/min and monitor CBC and Cx. Her renal function will not change due to age so the dose will stay the same.   Cont levaquin 500mg  q48 Rx will sign off

## 2013-07-31 NOTE — ED Provider Notes (Signed)
Medical screening examination/treatment/procedure(s) were conducted as a shared visit with non-physician practitioner(s) and myself.  I personally evaluated the patient during the encounter.  EKG Interpretation    Date/Time:    Ventricular Rate:    PR Interval:    QRS Duration:   QT Interval:    QTC Calculation:   R Axis:     Text Interpretation:              I evaluated patient also. Worsening dyspnea and inability to lie flat over past few weeks. CXR shows possible pneumonia. Here, requiring 2L O2 to maintain sats, no respiratory distress, talking in complete sentences. Mild basilar adventitious sounds on lung exam. Lasix given for elevated BNP. CAP coverage for pneumonia.  Dagmar HaitWilliam Jashun Puertas, MD 07/31/13 [phone]

## 2013-07-31 NOTE — Progress Notes (Signed)
PROGRESS NOTE  [name] ZOX:[id number] DOB: 01/26/1922 DOA: 07/30/2013 PCP: Cala Bradford, MD  HPI: [name] is a 80 y.o. female with PMH of HTN, HPL, CHF, GERD, prior h/o PE, PAF, GIB, asthma presented with worsening SOB, wheezing, congestion, productive cough for 2 weeks. No history of fevers, but had some chills, night sweeats; denies nausea, vomiting or diarrhea; ED found to have CHF, PNA  Assessment/Plan: Acute on chronic CHF; CXR: congestion; echo (04/26/13); LVEF 60%, severe LVH, dilated LA, pulmon HTN  -started IV diuresis, close monitor I/O, daily weight; possibility for worsening renal function  Pneumonia; CXR: R lung infiltrate  -cont IV atx, f/u cultures  Acute respiratory failure, hypoxia due to CHF/PNA; cont as above  - Improving HTN cont home meds, I decreased her metoprolol given bradycardia this morning. H/o PAF; cont amiodarone, not on AC presumable due to GIB; check ECG;  CKD III, close monitor while on diuretics  - Renal function is stable, continue diuretics.  Diet: Low sodium Fluids: Saline lock DVT Prophylaxis: Heparin  Code Status: Full code Family Communication: None  Disposition Plan: Home in medically ready. PT to evaluate.  Consultants:  None  Procedures:  None   Antibiotics  Anti-infectives   Start     Dose/Rate Route Frequency Ordered Stop   08/02/13 0000  levofloxacin (LEVAQUIN) IVPB 750 mg  Status:  Discontinued     750 mg 100 mL/hr over 90 Minutes Intravenous Every 48 hours 07/31/13 0025 07/31/13 0046   08/02/13 0000  levofloxacin (LEVAQUIN) IVPB 500 mg     500 mg 100 mL/hr over 60 Minutes Intravenous Every 48 hours 07/31/13 0047     07/31/13 0030  levofloxacin (LEVAQUIN) IVPB 750 mg     750 mg 100 mL/hr over 90 Minutes Intravenous  Once 07/31/13 0025 07/31/13 0230   07/30/13 1545  cefTRIAXone (ROCEPHIN) 1 g in dextrose 5 % 50 mL IVPB     1 g 100 mL/hr over 30 Minutes Intravenous  Once 07/30/13 1531 07/30/13  1733   07/30/13 1545  azithromycin (ZITHROMAX) 500 mg in dextrose 5 % 250 mL IVPB     500 mg 250 mL/hr over 60 Minutes Intravenous  Once 07/30/13 1531 07/30/13 1827     Antibiotics Given (last 72 hours)   Date/Time Action Medication Dose Rate   07/31/13 0100 Given   levofloxacin (LEVAQUIN) IVPB 750 mg 750 mg 100 mL/hr      HPI/Subjective: - Subjectively feeling better this morning.   Objective: Filed Vitals:   07/31/13 0159 07/31/13 0539 07/31/13 0805 07/31/13 1102  BP: 142/86 144/47    Pulse: 86 50  55  Temp: 98.6 F (37 C) 98.4 F (36.9 C)    TempSrc: Oral Oral    Resp: 20 20    Height:      Weight:  68.54 kg (151 lb 1.7 oz)    SpO2: 96% 98% 97%     Intake/Output Summary (Last 24 hours) at 07/31/13 1713 Last data filed at 07/31/13 1347  Gross per 24 hour  Intake    990 ml  Output   1320 ml  Net   -330 ml   Filed Weights   07/30/13 1351 07/30/13 1939 07/31/13 0539  Weight: 69.174 kg (152 lb 8 oz) 69.899 kg (154 lb 1.6 oz) 68.54 kg (151 lb 1.7 oz)    Exam:   General:  NAD  Cardiovascular: regular rate and rhythm, without MRG  Respiratory: good air movement, mild crackles.  Abdomen: soft,  not tender to palpation, positive bowel sounds  MSK: 1-2+ peripheral edema  Neuro: non focal  Data Reviewed: Basic Metabolic Panel:  Recent Labs Lab 07/30/13 1359 07/31/13 0625  NA 140 143  K 3.5* 3.3*  CL 95* 93*  CO2 32 37*  GLUCOSE 105* 101*  BUN 52* 46*  CREATININE 1.82* 1.85*  CALCIUM 8.6 8.4   Liver Function Tests: No results found for this basename: AST, ALT, ALKPHOS, BILITOT, PROT, ALBUMIN,  in the last 168 hours No results found for this basename: LIPASE, AMYLASE,  in the last 168 hours No results found for this basename: AMMONIA,  in the last 168 hours CBC:  Recent Labs Lab 07/30/13 1359 07/31/13 0625  WBC 8.6 6.3  HGB 12.5 11.0*  HCT 37.6 34.6*  MCV 96.9 98.0  PLT 164 158   Cardiac Enzymes: No results found for this basename:  CKTOTAL, CKMB, CKMBINDEX, TROPONINI,  in the last 168 hours BNP (last 3 results)  Recent Labs  04/23/13 2245 04/27/13 1140 07/30/13 1500  PROBNP 6054.0* 3072.0* 11846.0*   CBG: No results found for this basename: GLUCAP,  in the last 168 hours  Recent Results (from the past 240 hour(s))  CULTURE, BLOOD (ROUTINE X 2)     Status: None   Collection Time    07/30/13  4:10 PM      Result Value Range Status   Specimen Description BLOOD ARM RIGHT   Final   Special Requests BOTTLES DRAWN AEROBIC AND ANAEROBIC 10CC   Final   Culture  Setup Time     Final   Value: 07/30/2013 21:59     Performed at [facility]   Culture     Final   Value:        BLOOD CULTURE RECEIVED NO GROWTH TO DATE CULTURE WILL BE HELD FOR 5 DAYS BEFORE ISSUING A FINAL NEGATIVE REPORT     Performed at [facility]   Report Status PENDING   Incomplete  CULTURE, BLOOD (ROUTINE X 2)     Status: None   Collection Time    07/30/13  4:15 PM      Result Value Range Status   Specimen Description BLOOD HAND RIGHT   Final   Special Requests BOTTLES DRAWN AEROBIC AND ANAEROBIC 10CC   Final   Culture  Setup Time     Final   Value: 07/30/2013 21:58     Performed at [facility]   Culture     Final   Value:        BLOOD CULTURE RECEIVED NO GROWTH TO DATE CULTURE WILL BE HELD FOR 5 DAYS BEFORE ISSUING A FINAL NEGATIVE REPORT     Performed at [facility]   Report Status PENDING   Incomplete     Studies: Dg Chest 2 View (if Patient Has Fever And/or Copd)  07/30/2013   CLINICAL DATA:  Cough and congestion. Wheezing. High blood pressure.  EXAM: CHEST  2 VIEW  COMPARISON:  04/25/2013.  FINDINGS: Cardiomegaly with pulmonary vascular congestion most notable centrally.  Calcified tortuous aorta.  Suggestion of subtle infiltrate right lung base. Attention to this on follow up.  IMPRESSION: Suggestion of subtle infiltrate right lung base. Attention to this on follow up.  Cardiomegaly with pulmonary  vascular congestion most notable centrally.  Calcified tortuous aorta.   Electronically Signed   By: Bridgett LarssonSteve  Olson M.D.   On: 07/30/2013 14:33    Scheduled Meds: . amiodarone  200 mg Oral 3 times  weekly  . aspirin EC  81 mg Oral Daily  . budesonide-formoterol  2 puff Inhalation BID  . cholecalciferol  1,000 Units Oral Daily  . doxazosin  2 mg Oral QHS  . ferrous sulfate  325 mg Oral QHS  . furosemide  40 mg Intravenous Q12H  . heparin  5,000 Units Subcutaneous Q8H  . hydrALAZINE  25 mg Oral BID  . ipratropium-albuterol  3 mL Nebulization TID  . [START ON 08/02/2013] levofloxacin (LEVAQUIN) IV  500 mg Intravenous Q48H  . montelukast  10 mg Oral QHS  . pantoprazole  40 mg Oral Daily  . propranolol  10 mg Oral BID  . simvastatin  10 mg Oral q1800  . sodium chloride  3 mL Intravenous Q12H   Continuous Infusions:   Principal Problem:   Pneumonia Active Problems:   Hypertensive heart disease without CHF   Acute on chronic diastolic congestive heart failure   CHF (congestive heart failure)   Time spent: 35  Pamella Pert, MD [facility] Pager [phone]. If 7 PM - 7 AM, please contact night-coverage at [email or web address], password [email or web address] 07/31/2013, 5:13 PM  LOS: 1 day

## 2013-08-01 MED ORDER — LEVOFLOXACIN 500 MG PO TABS: 500.0000 mg | ORAL_TABLET | Freq: Every day | ORAL | Status: DC

## 2013-08-01 MED ORDER — LEVOFLOXACIN 750 MG PO TABS
750.0000 mg | ORAL_TABLET | Freq: Once | ORAL | Status: DC
  Filled 2013-08-01: qty 1

## 2013-08-01 MED ORDER — LEVOFLOXACIN 500 MG PO TABS: 500.0000 mg | ORAL_TABLET | ORAL | Status: DC

## 2013-08-01 MED ORDER — LEVOFLOXACIN 500 MG PO TABS
500.0000 mg | ORAL_TABLET | ORAL | Status: DC
  Administered 2013-08-02: 500 mg via ORAL
  Filled 2013-08-01 (×3): qty 1

## 2013-08-01 NOTE — Progress Notes (Signed)
PROGRESS NOTE  [name] ZOX:[id number] DOB: February 18, 1922 DOA: 07/30/2013 PCP: Cala Bradford, MD  HPI: [name] is a 80 y.o. female with PMH of HTN, HPL, CHF, GERD, prior h/o PE, PAF, GIB, asthma presented with worsening SOB, wheezing, congestion, productive cough for 2 weeks. No history of fevers, but had some chills, night sweeats; denies nausea, vomiting or diarrhea; ED found to have CHF, PNA  Assessment/Plan: Acute on chronic CHF; CXR: congestion;  echo (04/26/13); LVEF 60%, severe LVH, dilated LA, pulmon HTN  -started IV diuresis, close monitor I/O, currently minus 1.5 L , Admission weight 69.1>> 68  Pneumonia; CXR: R lung infiltrate  - IV antibiotics on admission transitioned to IV Levaquin 1/7 -Repeat chest x-ray a.m. to denote clearing from diuresis. Acute respiratory failure, hypoxia due to CHF/PNA; cont as above  - Improving HTN cont home meds, I decreased her metoprolol given bradycardia this morning. H/o PAF; cont amiodarone 200 mg 3 times weekly, propanolol 10 twice a day not on AC presumable due to GIB; check ECG;  CKD III, close monitor while on diuretics  - Renal function is stable, continue Lasix 40 every 12 IV -Transition to by mouth in a.m.  Diet: Low sodium Fluids: Saline lock DVT Prophylaxis: Heparin  Code Status: Full code Family Communication: None, called Chinita [place] Daughter [phone] on cell phone that did not get through her Disposition Plan: Home in medically ready. Patient is functioning at home, uses cane and walker but does not drive does most of her other ADLs  Consultants:  None  Procedures:  None   Antibiotics  Anti-infectives   Start     Dose/Rate Route Frequency Ordered Stop   08/02/13 0000  levofloxacin (LEVAQUIN) IVPB 750 mg  Status:  Discontinued     750 mg 100 mL/hr over 90 Minutes Intravenous Every 48 hours 07/31/13 0025 07/31/13 0046   08/02/13 0000  levofloxacin (LEVAQUIN) IVPB 500 mg      500 mg 100 mL/hr over 60 Minutes Intravenous Every 48 hours 07/31/13 0047     07/31/13 0030  levofloxacin (LEVAQUIN) IVPB 750 mg     750 mg 100 mL/hr over 90 Minutes Intravenous  Once 07/31/13 0025 07/31/13 0230   07/30/13 1545  cefTRIAXone (ROCEPHIN) 1 g in dextrose 5 % 50 mL IVPB     1 g 100 mL/hr over 30 Minutes Intravenous  Once 07/30/13 1531 07/30/13 1733   07/30/13 1545  azithromycin (ZITHROMAX) 500 mg in dextrose 5 % 250 mL IVPB     500 mg 250 mL/hr over 60 Minutes Intravenous  Once 07/30/13 1531 07/30/13 1827     Antibiotics Given (last 72 hours)   Date/Time Action Medication Dose Rate   07/31/13 0100 Given   levofloxacin (LEVAQUIN) IVPB 750 mg 750 mg 100 mL/hr      HPI/Subjective: - Subjectively feeling better this morning.   Objective: Filed Vitals:   08/01/13 0933 08/01/13 0958 08/01/13 1430 08/01/13 1511  BP: 110/33  109/39   Pulse: 51  58   Temp: 97.5 F (36.4 C)  97.8 F (36.6 C)   TempSrc: Oral  Oral   Resp: 18  18   Height:      Weight:      SpO2: 97% 96% 95% 96%    Intake/Output Summary (Last 24 hours) at 08/01/13 1758 Last data filed at 08/01/13 1300  Gross per 24 hour  Intake    840 ml  Output   2000 ml  Net  -1160 ml  Filed Weights   07/30/13 1939 07/31/13 0539 08/01/13 0425  Weight: 69.899 kg (154 lb 1.6 oz) 68.54 kg (151 lb 1.7 oz) 68.221 kg (150 lb 6.4 oz)    Exam:   General:  NAD  Cardiovascular: regular rate and rhythm, without MRG  Respiratory: good air movement, mild crackles.  Abdomen: soft, not tender to palpation, positive bowel sounds  MSK: 1-2+ peripheral edema  Neuro: non focal  Data Reviewed: Basic Metabolic Panel:  Recent Labs Lab 07/30/13 1359 07/31/13 0625  NA 140 143  K 3.5* 3.3*  CL 95* 93*  CO2 32 37*  GLUCOSE 105* 101*  BUN 52* 46*  CREATININE 1.82* 1.85*  CALCIUM 8.6 8.4   Liver Function Tests: No results found for this basename: AST, ALT, ALKPHOS, BILITOT, PROT, ALBUMIN,  in the last 168  hours No results found for this basename: LIPASE, AMYLASE,  in the last 168 hours No results found for this basename: AMMONIA,  in the last 168 hours CBC:  Recent Labs Lab 07/30/13 1359 07/31/13 0625  WBC 8.6 6.3  HGB 12.5 11.0*  HCT 37.6 34.6*  MCV 96.9 98.0  PLT 164 158   Cardiac Enzymes: No results found for this basename: CKTOTAL, CKMB, CKMBINDEX, TROPONINI,  in the last 168 hours BNP (last 3 results)  Recent Labs  04/23/13 2245 04/27/13 1140 07/30/13 1500  PROBNP 6054.0* 3072.0* 11846.0*   CBG: No results found for this basename: GLUCAP,  in the last 168 hours  Recent Results (from the past 240 hour(s))  CULTURE, BLOOD (ROUTINE X 2)     Status: None   Collection Time    07/30/13  4:10 PM      Result Value Range Status   Specimen Description BLOOD ARM RIGHT   Final   Special Requests BOTTLES DRAWN AEROBIC AND ANAEROBIC 10CC   Final   Culture  Setup Time     Final   Value: 07/30/2013 21:59     Performed at [facility]   Culture     Final   Value:        BLOOD CULTURE RECEIVED NO GROWTH TO DATE CULTURE WILL BE HELD FOR 5 DAYS BEFORE ISSUING A FINAL NEGATIVE REPORT     Performed at [facility]   Report Status PENDING   Incomplete  CULTURE, BLOOD (ROUTINE X 2)     Status: None   Collection Time    07/30/13  4:15 PM      Result Value Range Status   Specimen Description BLOOD HAND RIGHT   Final   Special Requests BOTTLES DRAWN AEROBIC AND ANAEROBIC 10CC   Final   Culture  Setup Time     Final   Value: 07/30/2013 21:58     Performed at [facility]   Culture     Final   Value:        BLOOD CULTURE RECEIVED NO GROWTH TO DATE CULTURE WILL BE HELD FOR 5 DAYS BEFORE ISSUING A FINAL NEGATIVE REPORT     Performed at [facility]   Report Status PENDING   Incomplete     Studies: No results found.  Scheduled Meds: . amiodarone  200 mg Oral 3 times weekly  . aspirin EC  81 mg Oral Daily  . budesonide-formoterol  2 puff Inhalation  BID  . cholecalciferol  1,000 Units Oral Daily  . doxazosin  2 mg Oral QHS  . ferrous sulfate  325 mg Oral QHS  . furosemide  40  mg Intravenous Q12H  . heparin  5,000 Units Subcutaneous Q8H  . hydrALAZINE  25 mg Oral BID  . ipratropium-albuterol  3 mL Nebulization TID  . [START ON 08/02/2013] levofloxacin (LEVAQUIN) IV  500 mg Intravenous Q48H  . montelukast  10 mg Oral QHS  . pantoprazole  40 mg Oral Daily  . propranolol  10 mg Oral BID  . simvastatin  10 mg Oral q1800  . sodium chloride  3 mL Intravenous Q12H   Continuous Infusions:   Principal Problem:   Pneumonia Active Problems:   Hypertensive heart disease without CHF   Acute on chronic diastolic congestive heart failure   CHF (congestive heart failure)   Time spent: 28  Pleas Koch, MD [facility] (P) [phone] act night-coverage at [email or web address], password [email or web address] 08/01/2013, 5:58 PM  LOS: 2 days

## 2013-08-01 NOTE — Evaluation (Signed)
Physical Therapy Evaluation Patient Details Name: [name] MRN: [id number] DOB: September 21, 1921 Today's Date: 08/01/2013 Time: [id number] PT Time Calculation (min): 24 min  PT Assessment / Plan / Recommendation History of Present Illness  80 y.o. female PMHx diastolic CHF, HTN, A. fib, asthma, prior PE, CVA, history GI diverticular bleed, CKD stage III. Presented with productive cough (yellow) and dyspnea x1 week, associated with generalized malaise  Clinical Impression  Pt is mobilizing well at bed/ transfer level but is having decreased activity tolerance for ambulation and is requiring frequent rest breaks. Recommend at this time that she use her RW until stamina increases. PT will follow acutely but do not recommend f/u after d/c.    PT Assessment  Patient needs continued PT services    Follow Up Recommendations  No PT follow up    Does the patient have the potential to tolerate intense rehabilitation      Barriers to Discharge        Equipment Recommendations  None recommended by PT    Recommendations for Other Services     Frequency Min 3X/week    Precautions / Restrictions Precautions Precautions: Fall Precaution Comments: pt reports that she has fallen but not in the past yr Restrictions Weight Bearing Restrictions: No Other Position/Activity Restrictions: has remote h/o bilateral THA   Pertinent Vitals/Pain No pain O2 sats 97% on 3L O2 after ambulation, pt ambulated on RA      Mobility  Ambulation/Gait Ambulation Distance (Feet): 120 Feet Gait velocity: decreased General Gait Details: pt slightly unsteady at first, use of RW recommended, as opposed to cane, until pt stamina returns. Pt needed a standing rest break x1 min at halfway point.    Exercises Other Exercises Other Exercises: pt ambulates set distance in her home ~7x/ day, encouraged her to return to this slowly   PT Diagnosis: Difficulty walking  PT Problem List: Decreased strength;Decreased  activity tolerance;Decreased balance;Decreased mobility;Cardiopulmonary status limiting activity PT Treatment Interventions: DME instruction;Gait training;Stair training;Functional mobility training;Therapeutic activities;Therapeutic exercise;Balance training;Patient/family education     PT Goals(Current goals can be found in the care plan section) Acute Rehab PT Goals Patient Stated Goal: return home PT Goal Formulation: With patient Time For Goal Achievement: 08/15/13 Potential to Achieve Goals: Good  Visit Information  Last PT Received On: 08/01/13 Assistance Needed: +1 History of Present Illness: 80 y.o. female PMHx diastolic CHF, HTN, A. fib, asthma, prior PE, CVA, history GI diverticular bleed, CKD stage III. Presented with productive cough (yellow) and dyspnea x1 week, associated with generalized malaise       Prior Functioning  Home Living Family/patient expects to be discharged to:: Private residence Living Arrangements: Children;Other relatives Available Help at Discharge: Available 24 hours/day Type of Home: House Home Access: Stairs to enter Entergy CorporationEntrance Stairs-Number of Steps: 2 Entrance Stairs-Rails: None Home Layout: One level Home Equipment: Cane - single point;Shower seat;Bedside commode;Walker - 2 wheels Additional Comments: uses predominantly cane Prior Function Level of Independence: Independent with assistive device(s) Comments: daughter does housework and cooking/ shopping Communication Communication: HOH    Cognition  Cognition Arousal/Alertness: Awake/alert Behavior During Therapy: WFL for tasks assessed/performed Overall Cognitive Status: Within Functional Limits for tasks assessed    Extremity/Trunk Assessment Upper Extremity Assessment Upper Extremity Assessment: Overall WFL for tasks assessed Lower Extremity Assessment Lower Extremity Assessment: RLE deficits/detail;LLE deficits/detail RLE Deficits / Details: knee ext 4/5, knee flex 4/5, hip flex  4-/5, grossly WFL for age LLE Deficits / Details: same as RLE, grossly WFL for age Cervical /  Trunk Assessment Cervical / Trunk Assessment: Normal   Balance    End of Session PT - End of Session Equipment Utilized During Treatment: Gait belt Activity Tolerance: Patient tolerated treatment well Patient left: in chair;with call bell/phone within reach Nurse Communication: Mobility status  GP    Lyanne Co, PT  Acute Rehab Services  [phone]  Lyanne Co 08/01/2013, 9:22 AM

## 2013-08-01 NOTE — Progress Notes (Signed)
Pt. Alert and oriented this am. No s/s of distress or discomfort noted during the night. Pt. Denies pain this am. Pt. Resting in bed quietly. Call light within reach. RN will continue to monitor for changes in condition. Cartier Washko, Cheryll DessertKaren Cherrell

## 2013-08-02 ENCOUNTER — Inpatient Hospital Stay (HOSPITAL_COMMUNITY): Payer: Medicare Other

## 2013-08-02 LAB — RENAL FUNCTION PANEL
Albumin: 3 g/dL — ABNORMAL LOW (ref 3.5–5.2)
BUN: 41 mg/dL — ABNORMAL HIGH (ref 6–23)
CHLORIDE: 95 meq/L — AB (ref 96–112)
CO2: 37 meq/L — AB (ref 19–32)
Calcium: 9 mg/dL (ref 8.4–10.5)
Creatinine, Ser: 1.99 mg/dL — ABNORMAL HIGH (ref 0.50–1.10)
GFR calc non Af Amer: 21 mL/min — ABNORMAL LOW (ref >90)
GFR, EST AFRICAN AMERICAN: 24 mL/min — AB (ref >90)
Glucose, Bld: 133 mg/dL — ABNORMAL HIGH (ref 70–99)
Phosphorus: 3.5 mg/dL (ref 2.3–4.6)
Potassium: 3.8 mEq/L (ref 3.7–5.3)
SODIUM: 144 meq/L (ref 137–147)

## 2013-08-02 NOTE — Progress Notes (Signed)
Physical Therapy Treatment Patient Details Name: Verdene LennertMargarette A Minerd MRN: [id number] DOB: [birth year] Today's Date: 08/02/2013 Time: [id number] PT Time Calculation (min): 27 min  PT Assessment / Plan / Recommendation  History of Present Illness 80 y.o. female PMHx diastolic CHF, HTN, A. fib, asthma, prior PE, CVA, history GI diverticular bleed, CKD stage III. Presented with productive cough (yellow) and dyspnea x1 week, associated with generalized malaise   PT Comments   Pt with great progression with activity with use of RW and O2. Pt with sats 95% on RA which dropped to 87% on RA with 15' ambulation. Pt maintained 93-97% on 2L throughout with HR 61. Will continue to follow to maximize gait and activity.  Follow Up Recommendations  No PT follow up     Does the patient have the potential to tolerate intense rehabilitation     Barriers to Discharge        Equipment Recommendations       Recommendations for Other Services    Frequency     Progress towards PT Goals Progress towards PT goals: Progressing toward goals  Plan Current plan remains appropriate    Precautions / Restrictions Precautions Precautions: Fall   Pertinent Vitals/Pain No pain   Mobility  Bed Mobility Overal bed mobility: Modified Independent Transfers Overall transfer level: Modified independent Ambulation/Gait Ambulation/Gait assistance: Supervision Ambulation Distance (Feet): 400 Feet Assistive device: Rolling walker (2 wheeled) Gait Pattern/deviations: Step-through pattern;Decreased stride length Gait velocity: 2.530ft/sec Gait velocity interpretation: Below normal speed for age/gender General Gait Details: use of RW throughout with initial cueing for position in RW and pursed lip breathing Stairs: Yes Stairs assistance: Modified independent (Device/Increase time) Stair Management: Two rails;Forwards;Step to pattern Number of Stairs: 2 General stair comments: pt without rail at home and recommended  installation of rail instead of holding door like she currently does    Exercises General Exercises - Lower Extremity Long Arc Quad: AROM;Seated;Both;20 reps Hip Flexion/Marching: AROM;Seated;Both;20 reps Toe Raises: AROM;Seated;Both;20 reps Heel Raises: AROM;Seated;Both;20 reps   PT Diagnosis:    PT Problem List:   PT Treatment Interventions:     PT Goals (current goals can now be found in the care plan section)    Visit Information  Last PT Received On: 08/02/13 Assistance Needed: +1 History of Present Illness: 80 y.o. female PMHx diastolic CHF, HTN, A. fib, asthma, prior PE, CVA, history GI diverticular bleed, CKD stage III. Presented with productive cough (yellow) and dyspnea x1 week, associated with generalized malaise    Subjective Data      Cognition  Cognition Arousal/Alertness: Awake/alert Behavior During Therapy: WFL for tasks assessed/performed Overall Cognitive Status: Within Functional Limits for tasks assessed    Balance     End of Session PT - End of Session Equipment Utilized During Treatment: Gait belt;Oxygen Activity Tolerance: Patient tolerated treatment well Patient left: in chair;with call bell/phone within reach Nurse Communication: Mobility status   GP     Delorse Lekabor, Tallin Hart Beth 08/02/2013, 2:53 PM Delaney MeigsMaija Tabor Renita Brocks, PT [phone]

## 2013-08-02 NOTE — Progress Notes (Signed)
SATURATION QUALIFICATIONS: (This note is used to comply with regulatory documentation for home oxygen)  Patient Saturations on Room Air at Rest = 95%  Patient Saturations on Room Air while Ambulating = 87%  Patient Saturations on 2 Liters of oxygen while Ambulating = 93%  Please briefly explain why patient needs home oxygen:pt desaturating without supplemental oxygen Delaney MeigsMaija Tabor Cassiopeia Florentino, PT [phone]

## 2013-08-02 NOTE — Progress Notes (Signed)
Pt. Alert and oriented this am. No s/s of distress or discomfort noted during the night. Pt. Denies pain this am. Pt. Resting in bed quietly. Call light within reach. RN will continue to monitor pt. For changes in condition. Darla Mcdonald, Cheryll DessertKaren Cherrell

## 2013-08-02 NOTE — Progress Notes (Signed)
Utilization Review Completed Jasmaine Rochel J. Joncarlo Friberg, RN, BSN, NCM [phone]  

## 2013-08-02 NOTE — Progress Notes (Signed)
PROGRESS NOTE  [name] PNT:[id number] DOB: 1922-03-29 DOA: 07/30/2013 PCP: Cala Bradford, MD  HPI: [name] is a 80 y.o. female with PMH of HTN, HPL, CHF, GERD, prior h/o PE, PAF, GIB, asthma presented with worsening SOB, wheezing, congestion, productive cough for 2 weeks. No history of fevers, but had some chills, night sweeats; denies nausea, vomiting or diarrhea; ED found to have CHF, PNA  Assessment/Plan: Acute on chronic CHF; CXR: congestion;  -Echo (04/26/13); LVEF 60%, severe LVH, dilated LA, pulmon HTN  -started IV diuresis on admission, close monitor I/O, currently minus 1.5 L -Admission weight 69.1>> 68  -Diuresis has been held morning 1/8 given peaking creatinine -Weight on discharge date last discharge was 69 KG Pneumonia; CXR: R lung infiltrate  - IV antibiotics on admission transitioned to IV Levaquin 1/7, then PO 500 mg q 48 1/8 -Repeat chest x-ray 08/02/13 = interval clearing of consolidation Acute respiratory failure, hypoxia due to CHF/PNA; cont as above  - Improving HTN cont home meds, I decreased her metoprolol given bradycardia this morning. H/o PAF; cont amiodarone 200 mg 3 times weekly, propanolol 10 twice a day not on AC presumable due to GIB; check ECG;  CKD III, close monitor while on diuretics  -Given she peaked at 1.99 creatinine, have held IV Lasix today. Will monitor her ins and outs and reassess in a.m.  Diet: Low sodium Fluids: Saline lock DVT Prophylaxis: Heparin  Code Status: Full code Family Communication: None, called Chinita Greenland Daughter [phone] on cell phone that did not get through her Disposition Plan: Home in medically ready. Patient is functioning at home, uses cane and walker but does not drive does most of her other ADLs  Consultants:  None  Procedures:  None   Antibiotics  Anti-infectives   Start     Dose/Rate Route Frequency Ordered Stop   08/03/13 1845  levofloxacin (LEVAQUIN)  tablet 500 mg  Status:  Discontinued     500 mg Oral Every 48 hours 08/01/13 1830 08/01/13 1831   08/02/13 0800  levofloxacin (LEVAQUIN) tablet 500 mg     500 mg Oral Every 48 hours 08/01/13 1834     08/02/13 0000  levofloxacin (LEVAQUIN) IVPB 750 mg  Status:  Discontinued     750 mg 100 mL/hr over 90 Minutes Intravenous Every 48 hours 07/31/13 0025 07/31/13 0046   08/02/13 0000  levofloxacin (LEVAQUIN) IVPB 500 mg  Status:  Discontinued     500 mg 100 mL/hr over 60 Minutes Intravenous Every 48 hours 07/31/13 0047 08/01/13 1822   08/01/13 1830  levofloxacin (LEVAQUIN) tablet 500 mg  Status:  Discontinued     500 mg Oral Daily 08/01/13 1822 08/01/13 1827   08/01/13 1830  levofloxacin (LEVAQUIN) tablet 750 mg  Status:  Discontinued     750 mg Oral  Once 08/01/13 1830 08/01/13 1831   07/31/13 0030  levofloxacin (LEVAQUIN) IVPB 750 mg     750 mg 100 mL/hr over 90 Minutes Intravenous  Once 07/31/13 0025 07/31/13 0230   07/30/13 1545  cefTRIAXone (ROCEPHIN) 1 g in dextrose 5 % 50 mL IVPB     1 g 100 mL/hr over 30 Minutes Intravenous  Once 07/30/13 1531 07/30/13 1733   07/30/13 1545  azithromycin (ZITHROMAX) 500 mg in dextrose 5 % 250 mL IVPB     500 mg 250 mL/hr over 60 Minutes Intravenous  Once 07/30/13 1531 07/30/13 1827     Antibiotics Given (last 72 hours)   Date/Time Action Medication  Dose Rate   07/31/13 0100 Given   levofloxacin (LEVAQUIN) IVPB 750 mg 750 mg 100 mL/hr   08/02/13 0800 Given   levofloxacin (LEVAQUIN) tablet 500 mg 500 mg       HPI/Subjective: - Stoll coughing NO fever no chills no n/v/sob Feels close to her baseline  Objective: Filed Vitals:   08/01/13 2030 08/01/13 2136 08/02/13 0537 08/02/13 0645  BP: 124/46  143/61   Pulse: 70  68   Temp: 98.3 F (36.8 C)  98.4 F (36.9 C)   TempSrc: Oral  Oral   Resp: 18  18   Height:      Weight:   67.132 kg (148 lb)   SpO2: 96% 95% 98% 90%    Intake/Output Summary (Last 24 hours) at 08/02/13 1240 Last data  filed at 08/02/13 0841  Gross per 24 hour  Intake    720 ml  Output   1000 ml  Net   -280 ml   Filed Weights   07/31/13 0539 08/01/13 0425 08/02/13 0537  Weight: 68.54 kg (151 lb 1.7 oz) 68.221 kg (150 lb 6.4 oz) 67.132 kg (148 lb)    Exam:   General:  NAD  Cardiovascular: regular rate and rhythm, without MRG  Respiratory: good air movement, mild crackles.  Abdomen: soft, not tender to palpation, positive bowel sounds  MSK: 1-2+ peripheral edema  Neuro: non focal  Data Reviewed: Basic Metabolic Panel:  Recent Labs Lab 07/30/13 1359 07/31/13 0625 08/02/13 0841  NA 140 143 144  K 3.5* 3.3* 3.8  CL 95* 93* 95*  CO2 32 37* 37*  GLUCOSE 105* 101* 133*  BUN 52* 46* 41*  CREATININE 1.82* 1.85* 1.99*  CALCIUM 8.6 8.4 9.0  PHOS  --   --  3.5   Liver Function Tests:  Recent Labs Lab 08/02/13 0841  ALBUMIN 3.0*   No results found for this basename: LIPASE, AMYLASE,  in the last 168 hours No results found for this basename: AMMONIA,  in the last 168 hours CBC:  Recent Labs Lab 07/30/13 1359 07/31/13 0625  WBC 8.6 6.3  HGB 12.5 11.0*  HCT 37.6 34.6*  MCV 96.9 98.0  PLT 164 158   Cardiac Enzymes: No results found for this basename: CKTOTAL, CKMB, CKMBINDEX, TROPONINI,  in the last 168 hours BNP (last 3 results)  Recent Labs  04/23/13 2245 04/27/13 1140 07/30/13 1500  PROBNP 6054.0* 3072.0* 11846.0*   CBG: No results found for this basename: GLUCAP,  in the last 168 hours  Recent Results (from the past 240 hour(s))  CULTURE, BLOOD (ROUTINE X 2)     Status: None   Collection Time    07/30/13  4:10 PM      Result Value Range Status   Specimen Description BLOOD ARM RIGHT   Final   Special Requests BOTTLES DRAWN AEROBIC AND ANAEROBIC 10CC   Final   Culture  Setup Time     Final   Value: 07/30/2013 21:59     Performed at [facility]   Culture     Final   Value:        BLOOD CULTURE RECEIVED NO GROWTH TO DATE CULTURE WILL BE HELD FOR 5  DAYS BEFORE ISSUING A FINAL NEGATIVE REPORT     Performed at [facility]   Report Status PENDING   Incomplete  CULTURE, BLOOD (ROUTINE X 2)     Status: None   Collection Time    07/30/13  4:15 PM  Result Value Range Status   Specimen Description BLOOD HAND RIGHT   Final   Special Requests BOTTLES DRAWN AEROBIC AND ANAEROBIC 10CC   Final   Culture  Setup Time     Final   Value: 07/30/2013 21:58     [facility]   Culture     Final   Value:        BLOOD CULTURE RECEIVED NO GROWTH TO DATE CULTURE WILL BE HELD FOR 5 DAYS BEFORE ISSUING A FINAL NEGATIVE REPORT     [facility]   Report Status PENDING   Incomplete     Studies: Dg Chest 2 View  08/02/2013   CLINICAL DATA:  Rule out pneumonia  EXAM: CHEST  2 VIEW  COMPARISON:  07/30/2013  FINDINGS: Marked cardiac enlargement without evidence of heart failure or edema. No significant pleural effusion.  Negative for pneumonia. Improved aeration in the right lung base compared with the prior study.  IMPRESSION: Interval clearing of right lower lobe airspace disease. Negative for pneumonia.  Cardiac enlargement without edema.   Electronically Signed   By: Marlan Palauharles  Clark M.D.   On: 08/02/2013 10:22    Scheduled Meds: . amiodarone  200 mg Oral 3 times weekly  . aspirin EC  81 mg Oral Daily  . budesonide-formoterol  2 puff Inhalation BID  . cholecalciferol  1,000 Units Oral Daily  . doxazosin  2 mg Oral QHS  . ferrous sulfate  325 mg Oral QHS  . heparin  5,000 Units Subcutaneous Q8H  . hydrALAZINE  25 mg Oral BID  . ipratropium-albuterol  3 mL Nebulization TID  . levofloxacin  500 mg Oral Q48H  . montelukast  10 mg Oral QHS  . pantoprazole  40 mg Oral Daily  . propranolol  10 mg Oral BID  . simvastatin  10 mg Oral q1800  . sodium chloride  3 mL Intravenous Q12H   Continuous Infusions:   Principal Problem:   Pneumonia Active Problems:   Hypertensive heart disease without CHF   Acute  on chronic diastolic congestive heart failure   CHF (congestive heart failure)   Time spent: 7035  Pleas KochJai Lillia Lengel, MD [facility] (P) [phone] act night-coverage at [email or web address], password [email or web address] 08/02/2013, 12:40 PM  LOS: 3 days

## 2013-08-03 MED ORDER — LEVOFLOXACIN 500 MG PO TABS: 500.0000 mg | ORAL_TABLET | ORAL | Status: DC

## 2013-08-03 MED ORDER — BUMETANIDE 2 MG PO TABS: 1.0000 mg | ORAL_TABLET | Freq: Every day | ORAL | Status: DC

## 2013-08-03 NOTE — Progress Notes (Signed)
SATURATION QUALIFICATIONS: (This note is used to comply with regulatory documentation for home oxygen)  Patient Saturations on Room Air at Rest = 95%  Patient Saturations on Room Air while Ambulating = 87%  Patient Saturations on 2 Liters of oxygen while Ambulating = 93%  Please briefly explain why patient needs home oxygen:  Patient ambulated on RA and saturations dropped to 87%. Patients saturations increased to 93% with 2L O2 via nasal cannula while ambulating. Dr. Mahala MenghiniSamtani on unit and made aware.

## 2013-08-03 NOTE — Progress Notes (Signed)
Home discharge instructions and medication discussed with [name], daughter. Discussed f/u appts as well as diet. [name] verbally understands discharge instructions.

## 2013-08-03 NOTE — Care Management Note (Signed)
    Page 1 of 2   08/03/2013     3:14:57 PM   CARE MANAGEMENT NOTE 08/03/2013  Patient:  [name],[name]   Account Number:  [id number]  Date Initiated:  08/03/2013  Documentation initiated by:  [facility],Saria Haran  Subjective/Objective Assessment:   80 y.o. female with PMH of HTN, HPL, CHF, GERD, prior h/o PE, PAF, GIB, asthma presented with worsening SOB, wheezing, congestion, productive cough for 2 weeks.//Home with daughter [name] [phone])     Action/Plan:   IV diuresis, close monitor I/O, daily weight; expect worsening renal function//Home with O2   Anticipated DC Date:  08/03/2013   Anticipated DC Plan:  HOME/SELF CARE      DC Planning Services  CM consult      PAC Choice  DURABLE MEDICAL EQUIPMENT   Choice offered to / List presented to:     DME arranged  [facility]      DME agency  [facility]        Status of service:  Completed, signed off Medicare Important Message given?   (If response is "NO", the following Medicare IM given date fields will be blank) Date Medicare IM given:   Date Additional Medicare IM given:    Discharge Disposition:    Per UR Regulation:    If discussed at Long Length of Stay Meetings, dates discussed:    Comments:  08/03/13 1445 Oletta Cohnamellia Orma Cheetham, RN, BSN, NCM [phone]   DME Oxygen ordered as follows:   Mode or (Route) Nasal cannula   Liters per Minute 2   Frequency Continuous (stationary and portable oxygen unit needed)   Oxygen conserving device Yes   [place] [facility] notified and will deliver to pt room prior to d/c home.

## 2013-08-03 NOTE — Discharge Summary (Signed)
Physician Discharge Summary  [name] ZOX:[id number] DOB: 26-Jun-1922 DOA: 07/30/2013  PCP: Cala Bradford, MD  Admit date: 07/30/2013 Discharge date: 08/03/2013  Time spent: 25 minutes  Recommendations for Outpatient Follow-up:  1. Needs to complete 3 more doses LEvaquin 2. BUmex dosage changed as has baseline CKD 3. Renal panel and CBC in about 3-5 days 4. Follow with PCP  Discharge Diagnoses:  Principal Problem:   Pneumonia Active Problems:   Hypertensive heart disease without CHF   Acute on chronic diastolic congestive heart failure   CHF (congestive heart failure)   Discharge Condition: Stable  Diet recommendation: reg  Filed Weights   08/01/13 0425 08/02/13 0537 08/03/13 0523  Weight: 68.221 kg (150 lb 6.4 oz) 67.132 kg (148 lb) 68 kg (149 lb 14.6 oz)    History of present illness:  [name] is a 80 y.o. female with PMH of HTN, HPL, CHF, GERD, prior h/o PE, PAF, GIB, asthma presented with worsening SOB, wheezing, congestion, productive cough for 2 weeks. No history of fevers, but had some chills, night sweeats; denies nausea, vomiting or diarrhea; ED found to have CHF, PNA   Hospital Course:  Acute on chronic CHF; CXR: congestion;  -Echo (04/26/13); LVEF 60%, severe LVH, dilated LA, pulmon HTN  -started IV diuresis on admission, close monitor I/O, currently minus 1.5 L  -Admission weight 69.1>> 68  -Diuresis has been held morning 1/8 given peaking creatinine  -Weight on discharge date last discharge was 69 KG -Needs O2 at home temporarily  Pneumonia; CXR: R lung infiltrate  - IV antibiotics on admission transitioned to IV Levaquin 1/7, then PO 500 mg q 48 1/8 -->stop date after 3 more doses -Repeat chest x-ray 08/02/13 = interval clearing of consolidation  Acute respiratory failure, hypoxia due to CHF/PNA; cont as above  - Improving  -Home O2 needed as desat 87% c activity HTN Metoprolol d/c this admit given breadycardia  H/o PAF; cont  amiodarone 200 mg 3 times weekly, propanolol 10 twice a day not on AC presumable due to GIB; check ECG;   CKD III, close monitor while on diuretics  -Given she peaked at 1.99 creatinine -initially given IV lasix,  THis was also held for 1-2 days in hosptial -decreased home bumex from 2-->1 mg daily on d/c   Discharge Exam: Filed Vitals:   08/03/13 0913  BP: 131/52  Pulse: 84  Temp: 97.9 F (36.6 C)  Resp:     General: eomi, ncat Cardiovascular:  s1 s2 no m/r/g Respiratory: clear  Discharge Instructions  Discharge Orders   Future Orders Complete By Expires   Diet - low sodium heart healthy  As directed    Increase activity slowly  As directed        Medication List         amiodarone 200 MG tablet  Commonly known as:  PACERONE  Take 200 mg by mouth 3 (three) times a week. Take one tablet on Monday, Wednesday, and Friday     aspirin EC 81 MG tablet  Take 81 mg by mouth daily.     budesonide-formoterol 80-4.5 MCG/ACT inhaler  Commonly known as:  SYMBICORT  Inhale 2 puffs into the lungs 2 (two) times daily.     bumetanide 2 MG tablet  Commonly known as:  BUMEX  Take 0.5 tablets (1 mg total) by mouth daily.     chlorpheniramine-HYDROcodone 10-8 MG/5ML Lqcr  Commonly known as:  TUSSIONEX  Take 5 mLs by mouth every 12 (twelve)  hours as needed (for cough).     cholecalciferol 1000 UNITS tablet  Commonly known as:  VITAMIN D  Take 1,000 Units by mouth daily.     colchicine 0.6 MG tablet  Take 0.6 mg by mouth daily as needed (for gout).     doxazosin 4 MG tablet  Commonly known as:  CARDURA  Take 4 mg by mouth at bedtime.     ferrous sulfate 325 (65 FE) MG tablet  Take 325 mg by mouth at bedtime.     fluticasone 50 MCG/ACT nasal spray  Commonly known as:  FLONASE  Place 2 sprays into the nose daily.     hydrALAZINE 25 MG tablet  Commonly known as:  APRESOLINE  Take 25 mg by mouth 2 (two) times daily.     ipratropium 0.02 % nebulizer solution  Commonly  known as:  ATROVENT  Take 0.5 mg by nebulization 3 (three) times daily.     levofloxacin 500 MG tablet  Commonly known as:  LEVAQUIN  Take 1 tablet (500 mg total) by mouth every other day.     montelukast 10 MG tablet  Commonly known as:  SINGULAIR  Take 10 mg by mouth at bedtime. For shortness of breath     multivitamins ther. w/minerals Tabs tablet  Take 1 tablet by mouth daily.     omeprazole 20 MG capsule  Commonly known as:  PRILOSEC  Take 20 mg by mouth daily.     pravastatin 20 MG tablet  Commonly known as:  PRAVACHOL  Take 20 mg by mouth every evening.     propranolol 20 MG tablet  Commonly known as:  INDERAL  Take 20 mg by mouth 2 (two) times daily.     sodium chloride 0.65 % nasal spray  Commonly known as:  OCEAN  Place 1 spray into the nose as needed for congestion.     traMADol 50 MG tablet  Commonly known as:  ULTRAM  Take 50 mg by mouth every 8 (eight) hours as needed for pain. For muscle spasms. Maximum dose= 8 tablets per day       Allergies  Allergen Reactions  . Lisinopril     REACTION: angioedema  . Primidone Other (See Comments)    No reaction noted  . Warfarin And Related     "GI BLEED"  . Clonidine Derivatives Rash      The results of significant diagnostics from this hospitalization (including imaging, microbiology, ancillary and laboratory) are listed below for reference.    Significant Diagnostic Studies: Dg Chest 2 View  08/02/2013   CLINICAL DATA:  Rule out pneumonia  EXAM: CHEST  2 VIEW  COMPARISON:  07/30/2013  FINDINGS: Marked cardiac enlargement without evidence of heart failure or edema. No significant pleural effusion.  Negative for pneumonia. Improved aeration in the right lung base compared with the prior study.  IMPRESSION: Interval clearing of right lower lobe airspace disease. Negative for pneumonia.  Cardiac enlargement without edema.   Electronically Signed   By: Marlan Palau M.D.   On: 08/02/2013 10:22   Dg Chest 2 View  (if Patient Has Fever And/or Copd)  07/30/2013   CLINICAL DATA:  Cough and congestion. Wheezing. High blood pressure.  EXAM: CHEST  2 VIEW  COMPARISON:  04/25/2013.  FINDINGS: Cardiomegaly with pulmonary vascular congestion most notable centrally.  Calcified tortuous aorta.  Suggestion of subtle infiltrate right lung base. Attention to this on follow up.  IMPRESSION: Suggestion of subtle infiltrate right lung base.  Attention to this on follow up.  Cardiomegaly with pulmonary vascular congestion most notable centrally.  Calcified tortuous aorta.   Electronically Signed   By: Bridgett LarssonSteve  Olson M.D.   On: 07/30/2013 14:33    Microbiology: Recent Results (from the past 240 hour(s))  CULTURE, BLOOD (ROUTINE X 2)     Status: None   Collection Time    07/30/13  4:10 PM      Result Value Range Status   Specimen Description BLOOD ARM RIGHT   Final   Special Requests BOTTLES DRAWN AEROBIC AND ANAEROBIC 10CC   Final   Culture  Setup Time     Final   Value: 07/30/2013 21:59     Performed at [facility]   Culture     Final   Value:        BLOOD CULTURE RECEIVED NO GROWTH TO DATE CULTURE WILL BE HELD FOR 5 DAYS BEFORE ISSUING A FINAL NEGATIVE REPORT     Performed at [facility]   Report Status PENDING   Incomplete  CULTURE, BLOOD (ROUTINE X 2)     Status: None   Collection Time    07/30/13  4:15 PM      Result Value Range Status   Specimen Description BLOOD HAND RIGHT   Final   Special Requests BOTTLES DRAWN AEROBIC AND ANAEROBIC 10CC   Final   Culture  Setup Time     Final   Value: 07/30/2013 21:58     Performed at [facility]   Culture     Final   Value:        BLOOD CULTURE RECEIVED NO GROWTH TO DATE CULTURE WILL BE HELD FOR 5 DAYS BEFORE ISSUING A FINAL NEGATIVE REPORT     Performed at [facility]   Report Status PENDING   Incomplete     Labs: Basic Metabolic Panel:  Recent Labs Lab 07/30/13 1359 07/31/13 0625 08/02/13 0841  NA 140 143 144  K 3.5* 3.3*  3.8  CL 95* 93* 95*  CO2 32 37* 37*  GLUCOSE 105* 101* 133*  BUN 52* 46* 41*  CREATININE 1.82* 1.85* 1.99*  CALCIUM 8.6 8.4 9.0  PHOS  --   --  3.5   Liver Function Tests:  Recent Labs Lab 08/02/13 0841  ALBUMIN 3.0*   No results found for this basename: LIPASE, AMYLASE,  in the last 168 hours No results found for this basename: AMMONIA,  in the last 168 hours CBC:  Recent Labs Lab 07/30/13 1359 07/31/13 0625  WBC 8.6 6.3  HGB 12.5 11.0*  HCT 37.6 34.6*  MCV 96.9 98.0  PLT 164 158   Cardiac Enzymes: No results found for this basename: CKTOTAL, CKMB, CKMBINDEX, TROPONINI,  in the last 168 hours BNP: BNP (last 3 results)  Recent Labs  04/23/13 2245 04/27/13 1140 07/30/13 1500  PROBNP 6054.0* 3072.0* 11846.0*   CBG: No results found for this basename: GLUCAP,  in the last 168 hours     Signed:  Rhetta MuraSAMTANI, JAI-GURMUKH  [facility] 08/03/2013, 2:15 PM

## 2013-08-03 NOTE — Progress Notes (Signed)
PT Cancellation Note  Patient Details Name: [name] MRN: [id number] DOB: Dec 28, 1921   Cancelled Treatment:    Reason Eval/Treat Not Completed: Other (comment); patient seen walking in hallway with RN to check O2 sats.  Noted patient supervision to independent with walker for mobility last session and will have help at home.  Will defer tx to Monday if pt remains inpatient.   Amonte Brookover,CYNDI 08/03/2013, 2:24 PM

## 2013-08-05 LAB — CULTURE, BLOOD (ROUTINE X 2)
Culture: NO GROWTH
Culture: NO GROWTH

## 2013-10-24 ENCOUNTER — Other Ambulatory Visit (HOSPITAL_COMMUNITY): Payer: Self-pay | Admitting: *Deleted

## 2013-10-25 ENCOUNTER — Encounter (HOSPITAL_COMMUNITY)
Admission: RE | Admit: 2013-10-25 | Discharge: 2013-10-25 | Disposition: A | Discharge status: Home / Self Care | Payer: Medicare Other | Source: Ambulatory Visit | Attending: Nephrology | Admitting: Nephrology

## 2013-10-25 DIAGNOSIS — N183 Chronic kidney disease, stage 3 unspecified: Secondary | ICD-10-CM | POA: Diagnosis not present

## 2013-10-25 DIAGNOSIS — D631 Anemia in chronic kidney disease: Secondary | ICD-10-CM | POA: Insufficient documentation

## 2013-10-25 DIAGNOSIS — Z5181 Encounter for therapeutic drug level monitoring: Secondary | ICD-10-CM | POA: Diagnosis present

## 2013-10-25 DIAGNOSIS — N039 Chronic nephritic syndrome with unspecified morphologic changes: Secondary | ICD-10-CM

## 2013-10-25 LAB — POCT HEMOGLOBIN-HEMACUE: Hemoglobin: 8.7 g/dL — ABNORMAL LOW (ref 12.0–15.0)

## 2013-10-25 MED ORDER — EPOETIN ALFA 10000 UNIT/ML IJ SOLN
INTRAMUSCULAR | Status: AC
  Filled 2013-10-25: qty 1

## 2013-10-25 MED ORDER — SODIUM CHLORIDE 0.9 % IV SOLN
510.0000 mg | Freq: Once | INTRAVENOUS | Status: DC
  Administered 2013-10-25: 510 mg via INTRAVENOUS
  Filled 2013-10-25: qty 17

## 2013-10-25 MED ORDER — EPOETIN ALFA 10000 UNIT/ML IJ SOLN
5000.0000 [IU] | INTRAMUSCULAR | Status: DC
  Administered 2013-10-25: 12:00:00 5000 [IU] via SUBCUTANEOUS

## 2013-11-02 ENCOUNTER — Encounter (HOSPITAL_COMMUNITY)
Admission: RE | Admit: 2013-11-02 | Discharge: 2013-11-02 | Disposition: A | Discharge status: Home / Self Care | Payer: Medicare Other | Source: Ambulatory Visit | Attending: Nephrology | Admitting: Nephrology

## 2013-11-02 DIAGNOSIS — Z5181 Encounter for therapeutic drug level monitoring: Secondary | ICD-10-CM | POA: Diagnosis not present

## 2013-11-02 LAB — FERRITIN: Ferritin: 615 ng/mL — ABNORMAL HIGH (ref 10–291)

## 2013-11-02 LAB — IRON AND TIBC
Iron: 41 ug/dL — ABNORMAL LOW (ref 42–135)
Saturation Ratios: 13 % — ABNORMAL LOW (ref 20–55)
TIBC: 304 ug/dL (ref 250–470)
UIBC: 263 ug/dL (ref 125–400)

## 2013-11-02 LAB — POCT HEMOGLOBIN-HEMACUE: Hemoglobin: 9.2 g/dL — ABNORMAL LOW (ref 12.0–15.0)

## 2013-11-02 MED ORDER — SODIUM CHLORIDE 0.9 % IV SOLN
510.0000 mg | Freq: Once | INTRAVENOUS | Status: AC
  Administered 2013-11-02: 510 mg via INTRAVENOUS
  Filled 2013-11-02: qty 17

## 2013-11-02 MED ORDER — EPOETIN ALFA 10000 UNIT/ML IJ SOLN
INTRAMUSCULAR | Status: AC
  Filled 2013-11-02: qty 1

## 2013-11-02 MED ORDER — SODIUM CHLORIDE 0.9 % IV SOLN: 510.0000 mg | Freq: Once | INTRAVENOUS | Status: DC

## 2013-11-02 MED ORDER — EPOETIN ALFA 10000 UNIT/ML IJ SOLN
5000.0000 [IU] | INTRAMUSCULAR | Status: DC
  Administered 2013-11-02: 5000 [IU] via SUBCUTANEOUS

## 2013-11-08 ENCOUNTER — Other Ambulatory Visit (HOSPITAL_COMMUNITY): Payer: Self-pay | Admitting: *Deleted

## 2013-11-09 ENCOUNTER — Encounter (HOSPITAL_COMMUNITY)
Admission: RE | Admit: 2013-11-09 | Discharge: 2013-11-09 | Disposition: A | Discharge status: Home / Self Care | Payer: Medicare Other | Source: Ambulatory Visit | Attending: Nephrology | Admitting: Nephrology

## 2013-11-09 DIAGNOSIS — Z5181 Encounter for therapeutic drug level monitoring: Secondary | ICD-10-CM | POA: Diagnosis not present

## 2013-11-09 LAB — IRON AND TIBC
Iron: 44 ug/dL (ref 42–135)
Saturation Ratios: 14 % — ABNORMAL LOW (ref 20–55)
TIBC: 317 ug/dL (ref 250–470)
UIBC: 273 ug/dL (ref 125–400)

## 2013-11-09 LAB — POCT HEMOGLOBIN-HEMACUE: Hemoglobin: 9.3 g/dL — ABNORMAL LOW (ref 12.0–15.0)

## 2013-11-09 MED ORDER — EPOETIN ALFA 10000 UNIT/ML IJ SOLN: 5000.0000 [IU] | INTRAMUSCULAR | Status: DC

## 2013-11-09 MED ORDER — EPOETIN ALFA 10000 UNIT/ML IJ SOLN
INTRAMUSCULAR | Status: AC
  Administered 2013-11-09: 10000 [IU] via SUBCUTANEOUS
  Filled 2013-11-09: qty 1

## 2013-11-16 ENCOUNTER — Encounter (HOSPITAL_COMMUNITY)
Admission: RE | Admit: 2013-11-16 | Discharge: 2013-11-16 | Disposition: A | Discharge status: Home / Self Care | Payer: Medicare Other | Source: Ambulatory Visit | Attending: Nephrology | Admitting: Nephrology

## 2013-11-16 DIAGNOSIS — Z5181 Encounter for therapeutic drug level monitoring: Secondary | ICD-10-CM | POA: Diagnosis not present

## 2013-11-16 LAB — POCT HEMOGLOBIN-HEMACUE: Hemoglobin: 9.8 g/dL — ABNORMAL LOW (ref 12.0–15.0)

## 2013-11-16 MED ORDER — EPOETIN ALFA 10000 UNIT/ML IJ SOLN
5000.0000 [IU] | INTRAMUSCULAR | Status: DC
  Administered 2013-11-16: 5000 [IU] via SUBCUTANEOUS

## 2013-11-16 MED ORDER — EPOETIN ALFA 10000 UNIT/ML IJ SOLN
INTRAMUSCULAR | Status: AC
  Filled 2013-11-16: qty 1

## 2013-11-23 ENCOUNTER — Encounter (HOSPITAL_COMMUNITY)
Admission: RE | Admit: 2013-11-23 | Discharge: 2013-11-23 | Disposition: A | Discharge status: Home / Self Care | Payer: Medicare Other | Source: Ambulatory Visit | Attending: Nephrology | Admitting: Nephrology

## 2013-11-23 DIAGNOSIS — N183 Chronic kidney disease, stage 3 unspecified: Secondary | ICD-10-CM | POA: Insufficient documentation

## 2013-11-23 DIAGNOSIS — Z5181 Encounter for therapeutic drug level monitoring: Secondary | ICD-10-CM | POA: Insufficient documentation

## 2013-11-23 LAB — POCT HEMOGLOBIN-HEMACUE: Hemoglobin: 10.1 g/dL — ABNORMAL LOW (ref 12.0–15.0)

## 2013-11-23 MED ORDER — EPOETIN ALFA 10000 UNIT/ML IJ SOLN
5000.0000 [IU] | INTRAMUSCULAR | Status: DC
  Administered 2013-11-23: 5000 [IU] via SUBCUTANEOUS

## 2013-11-23 MED ORDER — EPOETIN ALFA 10000 UNIT/ML IJ SOLN
INTRAMUSCULAR | Status: AC
  Filled 2013-11-23: qty 1

## 2013-11-30 ENCOUNTER — Encounter (HOSPITAL_COMMUNITY)
Admission: RE | Admit: 2013-11-30 | Discharge: 2013-11-30 | Disposition: A | Discharge status: Home / Self Care | Payer: Medicare Other | Source: Ambulatory Visit | Attending: Nephrology | Admitting: Nephrology

## 2013-11-30 LAB — IRON AND TIBC
IRON: 43 ug/dL (ref 42–135)
Saturation Ratios: 15 % — ABNORMAL LOW (ref 20–55)
TIBC: 286 ug/dL (ref 250–470)
UIBC: 243 ug/dL (ref 125–400)

## 2013-11-30 LAB — FERRITIN: Ferritin: 688 ng/mL — ABNORMAL HIGH (ref 10–291)

## 2013-11-30 LAB — POCT HEMOGLOBIN-HEMACUE: Hemoglobin: 10 g/dL — ABNORMAL LOW (ref 12.0–15.0)

## 2013-11-30 MED ORDER — EPOETIN ALFA 10000 UNIT/ML IJ SOLN
INTRAMUSCULAR | Status: AC
  Filled 2013-11-30: qty 1

## 2013-11-30 MED ORDER — EPOETIN ALFA 10000 UNIT/ML IJ SOLN
5000.0000 [IU] | INTRAMUSCULAR | Status: DC
  Administered 2013-11-30: 5000 [IU] via SUBCUTANEOUS

## 2013-12-06 ENCOUNTER — Encounter (HOSPITAL_COMMUNITY)
Admission: RE | Admit: 2013-12-06 | Discharge: 2013-12-06 | Disposition: A | Discharge status: Home / Self Care | Payer: Medicare Other | Source: Ambulatory Visit | Attending: Nephrology | Admitting: Nephrology

## 2013-12-06 LAB — POCT HEMOGLOBIN-HEMACUE: HEMOGLOBIN: 9.7 g/dL — AB (ref 12.0–15.0)

## 2013-12-06 MED ORDER — EPOETIN ALFA 10000 UNIT/ML IJ SOLN
INTRAMUSCULAR | Status: AC
Start: 2013-12-06 — End: 2013-12-06
  Administered 2013-12-06: 5000 [IU] via SUBCUTANEOUS
  Filled 2013-12-06: qty 1

## 2013-12-06 MED ORDER — EPOETIN ALFA 10000 UNIT/ML IJ SOLN: 5000.0000 [IU] | INTRAMUSCULAR | Status: DC

## 2013-12-14 ENCOUNTER — Encounter (HOSPITAL_COMMUNITY)
Admission: RE | Admit: 2013-12-14 | Discharge: 2013-12-14 | Disposition: A | Discharge status: Home / Self Care | Payer: Medicare Other | Source: Ambulatory Visit | Attending: Nephrology | Admitting: Nephrology

## 2013-12-14 LAB — POCT HEMOGLOBIN-HEMACUE: Hemoglobin: 9.6 g/dL — ABNORMAL LOW (ref 12.0–15.0)

## 2013-12-14 MED ORDER — EPOETIN ALFA 10000 UNIT/ML IJ SOLN
INTRAMUSCULAR | Status: AC
  Filled 2013-12-14: qty 1

## 2013-12-14 MED ORDER — EPOETIN ALFA 10000 UNIT/ML IJ SOLN
5000.0000 [IU] | INTRAMUSCULAR | Status: DC
  Administered 2013-12-14: 5000 [IU] via SUBCUTANEOUS

## 2013-12-21 ENCOUNTER — Encounter (HOSPITAL_COMMUNITY)
Admission: RE | Admit: 2013-12-21 | Discharge: 2013-12-21 | Disposition: A | Discharge status: Home / Self Care | Payer: Medicare Other | Source: Ambulatory Visit | Attending: Nephrology | Admitting: Nephrology

## 2013-12-21 LAB — POCT HEMOGLOBIN-HEMACUE: HEMOGLOBIN: 10 g/dL — AB (ref 12.0–15.0)

## 2013-12-21 MED ORDER — EPOETIN ALFA 10000 UNIT/ML IJ SOLN
INTRAMUSCULAR | Status: AC
  Administered 2013-12-21: 5000 [IU] via SUBCUTANEOUS
  Filled 2013-12-21: qty 1

## 2013-12-21 MED ORDER — EPOETIN ALFA 10000 UNIT/ML IJ SOLN
5000.0000 [IU] | INTRAMUSCULAR | Status: DC
  Administered 2013-12-21: 5000 [IU] via SUBCUTANEOUS

## 2013-12-28 ENCOUNTER — Encounter (HOSPITAL_COMMUNITY)
Admission: RE | Admit: 2013-12-28 | Discharge: 2013-12-28 | Disposition: A | Discharge status: Home / Self Care | Payer: Medicare Other | Source: Ambulatory Visit | Attending: Nephrology | Admitting: Nephrology

## 2013-12-28 DIAGNOSIS — X58XXXA Exposure to other specified factors, initial encounter: Secondary | ICD-10-CM | POA: Insufficient documentation

## 2013-12-28 DIAGNOSIS — S066X0A Traumatic subarachnoid hemorrhage without loss of consciousness, initial encounter: Secondary | ICD-10-CM | POA: Diagnosis not present

## 2013-12-28 DIAGNOSIS — N183 Chronic kidney disease, stage 3 unspecified: Secondary | ICD-10-CM | POA: Diagnosis not present

## 2013-12-28 DIAGNOSIS — I4891 Unspecified atrial fibrillation: Secondary | ICD-10-CM | POA: Insufficient documentation

## 2013-12-28 DIAGNOSIS — Z5181 Encounter for therapeutic drug level monitoring: Secondary | ICD-10-CM | POA: Diagnosis present

## 2013-12-28 LAB — IRON AND TIBC
IRON: 31 ug/dL — AB (ref 42–135)
Saturation Ratios: 12 % — ABNORMAL LOW (ref 20–55)
TIBC: 264 ug/dL (ref 250–470)
UIBC: 233 ug/dL (ref 125–400)

## 2013-12-28 LAB — FERRITIN: Ferritin: 534 ng/mL — ABNORMAL HIGH (ref 10–291)

## 2013-12-28 LAB — POCT HEMOGLOBIN-HEMACUE: Hemoglobin: 10 g/dL — ABNORMAL LOW (ref 12.0–15.0)

## 2013-12-28 MED ORDER — EPOETIN ALFA 10000 UNIT/ML IJ SOLN
INTRAMUSCULAR | Status: AC
  Administered 2013-12-28: 5000 [IU] via SUBCUTANEOUS
  Filled 2013-12-28: qty 1

## 2013-12-28 MED ORDER — EPOETIN ALFA 10000 UNIT/ML IJ SOLN
5000.0000 [IU] | INTRAMUSCULAR | Status: DC
  Administered 2013-12-28: 5000 [IU] via SUBCUTANEOUS

## 2014-01-04 ENCOUNTER — Encounter (HOSPITAL_COMMUNITY)
Admission: RE | Admit: 2014-01-04 | Discharge: 2014-01-04 | Disposition: A | Discharge status: Home / Self Care | Payer: Medicare Other | Source: Ambulatory Visit | Attending: Nephrology | Admitting: Nephrology

## 2014-01-04 LAB — POCT HEMOGLOBIN-HEMACUE: Hemoglobin: 10.2 g/dL — ABNORMAL LOW (ref 12.0–15.0)

## 2014-01-04 MED ORDER — EPOETIN ALFA 10000 UNIT/ML IJ SOLN
INTRAMUSCULAR | Status: AC
  Administered 2014-01-04: 5000 [IU] via SUBCUTANEOUS
  Filled 2014-01-04: qty 1

## 2014-01-04 MED ORDER — EPOETIN ALFA 10000 UNIT/ML IJ SOLN: 5000.0000 [IU] | INTRAMUSCULAR | Status: DC

## 2014-01-05 ENCOUNTER — Encounter (HOSPITAL_COMMUNITY): Payer: Self-pay | Admitting: Emergency Medicine

## 2014-01-05 ENCOUNTER — Emergency Department (HOSPITAL_COMMUNITY): Payer: Medicare Other

## 2014-01-05 ENCOUNTER — Inpatient Hospital Stay (HOSPITAL_COMMUNITY)
Admission: EM | Admit: 2014-01-05 | Discharge: 2014-01-23 | DRG: 085 | Disposition: E | Payer: Medicare Other | Attending: Internal Medicine | Admitting: Internal Medicine

## 2014-01-05 DIAGNOSIS — Z96649 Presence of unspecified artificial hip joint: Secondary | ICD-10-CM

## 2014-01-05 DIAGNOSIS — N2581 Secondary hyperparathyroidism of renal origin: Secondary | ICD-10-CM | POA: Diagnosis present

## 2014-01-05 DIAGNOSIS — I509 Heart failure, unspecified: Secondary | ICD-10-CM | POA: Diagnosis present

## 2014-01-05 DIAGNOSIS — S065XAA Traumatic subdural hemorrhage with loss of consciousness status unknown, initial encounter: Principal | ICD-10-CM

## 2014-01-05 DIAGNOSIS — N189 Chronic kidney disease, unspecified: Secondary | ICD-10-CM

## 2014-01-05 DIAGNOSIS — I272 Pulmonary hypertension, unspecified: Secondary | ICD-10-CM

## 2014-01-05 DIAGNOSIS — N183 Chronic kidney disease, stage 3 unspecified: Secondary | ICD-10-CM

## 2014-01-05 DIAGNOSIS — Z7901 Long term (current) use of anticoagulants: Secondary | ICD-10-CM

## 2014-01-05 DIAGNOSIS — D638 Anemia in other chronic diseases classified elsewhere: Secondary | ICD-10-CM | POA: Diagnosis present

## 2014-01-05 DIAGNOSIS — G935 Compression of brain: Secondary | ICD-10-CM | POA: Diagnosis present

## 2014-01-05 DIAGNOSIS — W19XXXA Unspecified fall, initial encounter: Secondary | ICD-10-CM | POA: Diagnosis present

## 2014-01-05 DIAGNOSIS — G9349 Other encephalopathy: Secondary | ICD-10-CM | POA: Diagnosis present

## 2014-01-05 DIAGNOSIS — E785 Hyperlipidemia, unspecified: Secondary | ICD-10-CM | POA: Diagnosis present

## 2014-01-05 DIAGNOSIS — Z961 Presence of intraocular lens: Secondary | ICD-10-CM

## 2014-01-05 DIAGNOSIS — S06309A Unspecified focal traumatic brain injury with loss of consciousness of unspecified duration, initial encounter: Principal | ICD-10-CM

## 2014-01-05 DIAGNOSIS — M810 Age-related osteoporosis without current pathological fracture: Secondary | ICD-10-CM | POA: Diagnosis present

## 2014-01-05 DIAGNOSIS — G9340 Encephalopathy, unspecified: Secondary | ICD-10-CM

## 2014-01-05 DIAGNOSIS — I119 Hypertensive heart disease without heart failure: Secondary | ICD-10-CM

## 2014-01-05 DIAGNOSIS — N289 Disorder of kidney and ureter, unspecified: Secondary | ICD-10-CM

## 2014-01-05 DIAGNOSIS — Z9849 Cataract extraction status, unspecified eye: Secondary | ICD-10-CM

## 2014-01-05 DIAGNOSIS — M109 Gout, unspecified: Secondary | ICD-10-CM | POA: Diagnosis present

## 2014-01-05 DIAGNOSIS — I872 Venous insufficiency (chronic) (peripheral): Secondary | ICD-10-CM | POA: Diagnosis present

## 2014-01-05 DIAGNOSIS — S065X9A Traumatic subdural hemorrhage with loss of consciousness of unspecified duration, initial encounter: Secondary | ICD-10-CM | POA: Diagnosis present

## 2014-01-05 DIAGNOSIS — S0291XA Unspecified fracture of skull, initial encounter for closed fracture: Secondary | ICD-10-CM

## 2014-01-05 DIAGNOSIS — K219 Gastro-esophageal reflux disease without esophagitis: Secondary | ICD-10-CM | POA: Diagnosis present

## 2014-01-05 DIAGNOSIS — M19049 Primary osteoarthritis, unspecified hand: Secondary | ICD-10-CM | POA: Diagnosis present

## 2014-01-05 DIAGNOSIS — Z8673 Personal history of transient ischemic attack (TIA), and cerebral infarction without residual deficits: Secondary | ICD-10-CM

## 2014-01-05 DIAGNOSIS — Z888 Allergy status to other drugs, medicaments and biological substances status: Secondary | ICD-10-CM

## 2014-01-05 DIAGNOSIS — I6200 Nontraumatic subdural hemorrhage, unspecified: Secondary | ICD-10-CM | POA: Diagnosis present

## 2014-01-05 DIAGNOSIS — S066XAA Traumatic subarachnoid hemorrhage with loss of consciousness status unknown, initial encounter: Principal | ICD-10-CM | POA: Diagnosis present

## 2014-01-05 DIAGNOSIS — J9600 Acute respiratory failure, unspecified whether with hypoxia or hypercapnia: Secondary | ICD-10-CM | POA: Diagnosis not present

## 2014-01-05 DIAGNOSIS — R4020 Unspecified coma: Secondary | ICD-10-CM | POA: Diagnosis not present

## 2014-01-05 DIAGNOSIS — Z86711 Personal history of pulmonary embolism: Secondary | ICD-10-CM

## 2014-01-05 DIAGNOSIS — I130 Hypertensive heart and chronic kidney disease with heart failure and stage 1 through stage 4 chronic kidney disease, or unspecified chronic kidney disease: Secondary | ICD-10-CM | POA: Diagnosis present

## 2014-01-05 DIAGNOSIS — Y92009 Unspecified place in unspecified non-institutional (private) residence as the place of occurrence of the external cause: Secondary | ICD-10-CM

## 2014-01-05 DIAGNOSIS — Z9049 Acquired absence of other specified parts of digestive tract: Secondary | ICD-10-CM

## 2014-01-05 DIAGNOSIS — Z515 Encounter for palliative care: Secondary | ICD-10-CM

## 2014-01-05 DIAGNOSIS — I629 Nontraumatic intracranial hemorrhage, unspecified: Secondary | ICD-10-CM

## 2014-01-05 DIAGNOSIS — I609 Nontraumatic subarachnoid hemorrhage, unspecified: Secondary | ICD-10-CM | POA: Diagnosis present

## 2014-01-05 DIAGNOSIS — I2789 Other specified pulmonary heart diseases: Secondary | ICD-10-CM | POA: Diagnosis present

## 2014-01-05 DIAGNOSIS — I4891 Unspecified atrial fibrillation: Secondary | ICD-10-CM

## 2014-01-05 DIAGNOSIS — I5032 Chronic diastolic (congestive) heart failure: Secondary | ICD-10-CM

## 2014-01-05 DIAGNOSIS — J449 Chronic obstructive pulmonary disease, unspecified: Secondary | ICD-10-CM | POA: Diagnosis present

## 2014-01-05 DIAGNOSIS — I498 Other specified cardiac arrhythmias: Secondary | ICD-10-CM | POA: Diagnosis present

## 2014-01-05 DIAGNOSIS — Z66 Do not resuscitate: Secondary | ICD-10-CM | POA: Diagnosis present

## 2014-01-05 DIAGNOSIS — J4489 Other specified chronic obstructive pulmonary disease: Secondary | ICD-10-CM | POA: Diagnosis present

## 2014-01-05 DIAGNOSIS — S02109A Fracture of base of skull, unspecified side, initial encounter for closed fracture: Principal | ICD-10-CM | POA: Diagnosis present

## 2014-01-05 LAB — HEPARIN LEVEL (UNFRACTIONATED)

## 2014-01-05 LAB — CBC WITH DIFFERENTIAL/PLATELET
Basophils Absolute: 0 10*3/uL (ref 0.0–0.1)
Basophils Relative: 0 % (ref 0–1)
EOS ABS: 0.1 10*3/uL (ref 0.0–0.7)
Eosinophils Relative: 1 % (ref 0–5)
HCT: 30.8 % — ABNORMAL LOW (ref 36.0–46.0)
HEMOGLOBIN: 9.9 g/dL — AB (ref 12.0–15.0)
LYMPHS ABS: 0.4 10*3/uL — AB (ref 0.7–4.0)
Lymphocytes Relative: 5 % — ABNORMAL LOW (ref 12–46)
MCH: 31.1 pg (ref 26.0–34.0)
MCHC: 32.1 g/dL (ref 30.0–36.0)
MCV: 96.9 fL (ref 78.0–100.0)
Monocytes Absolute: 0.8 10*3/uL (ref 0.1–1.0)
Monocytes Relative: 8 % (ref 3–12)
NEUTROS PCT: 86 % — AB (ref 43–77)
Neutro Abs: 7.6 10*3/uL (ref 1.7–7.7)
Platelets: 107 10*3/uL — ABNORMAL LOW (ref 150–400)
RBC: 3.18 MIL/uL — ABNORMAL LOW (ref 3.87–5.11)
RDW: 15.1 % (ref 11.5–15.5)
WBC: 8.9 10*3/uL (ref 4.0–10.5)

## 2014-01-05 LAB — I-STAT CHEM 8, ED
BUN: 73 mg/dL — ABNORMAL HIGH (ref 6–23)
CREATININE: 2.9 mg/dL — AB (ref 0.50–1.10)
Calcium, Ion: 1.06 mmol/L — ABNORMAL LOW (ref 1.13–1.30)
Chloride: 81 mEq/L — ABNORMAL LOW (ref 96–112)
GLUCOSE: 193 mg/dL — AB (ref 70–99)
HCT: 35 % — ABNORMAL LOW (ref 36.0–46.0)
Hemoglobin: 11.9 g/dL — ABNORMAL LOW (ref 12.0–15.0)
POTASSIUM: 2.8 meq/L — AB (ref 3.7–5.3)
Sodium: 133 mEq/L — ABNORMAL LOW (ref 137–147)
TCO2: 36 mmol/L (ref 0–100)

## 2014-01-05 LAB — URINALYSIS, ROUTINE W REFLEX MICROSCOPIC
BILIRUBIN URINE: NEGATIVE
Glucose, UA: NEGATIVE mg/dL
Hgb urine dipstick: NEGATIVE
Ketones, ur: NEGATIVE mg/dL
LEUKOCYTES UA: NEGATIVE
Nitrite: NEGATIVE
PH: 7 (ref 5.0–8.0)
Protein, ur: NEGATIVE mg/dL
SPECIFIC GRAVITY, URINE: 1.011 (ref 1.005–1.030)
Urobilinogen, UA: 0.2 mg/dL (ref 0.0–1.0)

## 2014-01-05 LAB — CBG MONITORING, ED: Glucose-Capillary: 189 mg/dL — ABNORMAL HIGH (ref 70–99)

## 2014-01-05 LAB — PROTIME-INR
INR: 1.7 — AB (ref 0.00–1.49)
Prothrombin Time: 19.5 seconds — ABNORMAL HIGH (ref 11.6–15.2)

## 2014-01-05 LAB — APTT: aPTT: 32 seconds (ref 24–37)

## 2014-01-05 MED ORDER — SODIUM CHLORIDE 0.9 % IV BOLUS (SEPSIS)
500.0000 mL | Freq: Once | INTRAVENOUS | Status: AC
  Administered 2014-01-05: 500 mL via INTRAVENOUS

## 2014-01-05 MED ORDER — MORPHINE SULFATE 2 MG/ML IJ SOLN: 2.0000 mg | INTRAMUSCULAR | Status: DC | PRN

## 2014-01-05 MED ORDER — LORAZEPAM 2 MG/ML IJ SOLN
1.0000 mg | Freq: Once | INTRAMUSCULAR | Status: AC
  Administered 2014-01-05: 1 mg via INTRAVENOUS
  Filled 2014-01-05: qty 1

## 2014-01-05 MED ORDER — PROTHROMBIN COMPLEX CONC HUMAN 500 UNITS IV KIT: 50.0000 [IU]/kg | PACK | Status: DC

## 2014-01-05 NOTE — ED Notes (Signed)
Per ems, pt family left pt at 1000, pt at home uses walking aid to get around. Family came home and found her on the floor. No obvious injuries. Lateral recumbant position, c/o pain to her head, and nausea. Pt is AAOX4 at baseline. Head and neck pain at this time. Pt c/o headache to front area. Pt feels nausea. Pt spit up clear mucous with brown tinge. HTN, 170/84, pt is on eloquis. HR average 56. Hx of TIA, no residual affects. Hip replacement on the left side, hard to move that leg. Pt given 4mg  of zofran. PERRLA. No vision changes. Pt wears hearing aids. Pt is moaning.

## 2014-01-05 NOTE — ED Notes (Signed)
Abnormal labs given to Dr. Pickering  

## 2014-01-05 NOTE — Progress Notes (Signed)
Received report  On RN rounds noted pt unresponsive cyanosed with mottle lips and hands non responsive to any stimulus Pt DNR BP  91/54  HR 58 oxygen 81 with 4 l [place] MD notified and 500 cc Normal saline  Bolus started . Will continue to monitor,

## 2014-01-05 NOTE — Progress Notes (Signed)
Pt comfort care and per MD discontinue labs. Arabella MerlesP. Amo Raidon Swanner RN.

## 2014-01-05 NOTE — Progress Notes (Signed)
Pt had ring on her finger but removed to daughter to take home. Will continue to monitor and reported off to incoming RN. Arabella MerlesP. Amo Michela Herst RN.

## 2014-01-05 NOTE — ED Notes (Signed)
Attempted report 

## 2014-01-05 NOTE — ED Notes (Signed)
This nurse transported patient to CT. Transported patient back into Trauma room A. While in CT, Pt became much less responsive. Pt very difficult to arouse.

## 2014-01-05 NOTE — ED Notes (Signed)
Dr. Rubin PayorPickering notified of pt temperature.

## 2014-01-05 NOTE — ED Notes (Signed)
Dr Pickering at bedside 

## 2014-01-05 NOTE — ED Provider Notes (Signed)
CSN: [id number]     Arrival date & time 2014-01-26  1321 History   First MD Initiated Contact with Patient 2014-01-26 1333     Chief Complaint  Patient presents with  . Fall  . Altered Mental Status   Level V caveat due to altered mental status  (Consider location/radiation/quality/duration/timing/severity/associated sxs/prior Treatment) Patient is a 80 y.o. female presenting with fall and altered mental status. The history is provided by the patient and the EMS personnel.  Fall This is a new problem.  Altered Mental Status  patient presented after a fall. Unknown exactly what happened the patient states that her head hurts. She is on anticoagulation. Eliquis. She denies numbness or weakness. There is some difficulty moving her left hip, but has had a hip replacement on that side. Patient is initially able follow commands and answer some questions, although there appears be some confusion.  Past Medical History  Diagnosis Date  . Acute on chronic diastolic congestive heart failure 05/28/2011  . Hypertensive heart disease without CHF 05/28/2011  . History of pulmonary embolus (PE) 05/28/2011    2006 following pelvic fracture  . Chronic venous insufficiency 05/28/2011  . Hyperlipidemia 05/28/2011  . Gout 05/28/2011    "had it in my feet" (07/30/2013)  . Asthma 05/28/2011  . History of GI diverticular bleed 05/28/2011    June 2012  . Atrial fibrillation 05/28/2011  . Osteoporosis   . Pulmonary hypertension 05/28/2011  . GERD (gastroesophageal reflux disease) 05/28/2011  . Mitral regurgitation 05/31/2011  . Secondary hyperparathyroidism (of renal origin)   . Anemia of chronic disease   . Chronic diastolic congestive heart failure   . Hypertension   . COPD (chronic obstructive pulmonary disease)   . Pneumonia ?l;07/30/2013    "now; had it once before" (07/30/2013  . Shortness of breath     "can come on at anytime" (07/30/2013)  . Migraines     "had them when I was young; none in a long time"  (07/30/2013)  . CVA (cerebral vascular accident)     "mini; long time ago"; denies residual on 07/30/2013  . Arthritis     "fingers" (07/30/2013)  . Chronic kidney disease stage 3 05/28/2011   Past Surgical History  Procedure Laterality Date  . Partial colectomy      1999 following colonoscopy complication  . Total hip arthroplasty Left 2004    complicated with dislocation ("put it in place without surgery")  . Total hip arthroplasty Right 2005  . Cataract extraction w/ intraocular lens  implant, bilateral Bilateral   . Colon resection  1999    secondary to puncture of colon during colonoscopy/notes 10/10/2002 (07/30/2013)  . Inguinal hernia repair Right 1960's?   History reviewed. No pertinent family history. History  Substance Use Topics  . Smoking status: Never Smoker   . Smokeless tobacco: Never Used  . Alcohol Use: No   OB History   Grav Para Term Preterm Abortions TAB SAB Ect Mult Living                 Review of Systems  Unable to perform ROS     Allergies  Lisinopril; Primidone; Warfarin and related; and Clonidine derivatives  Home Medications   Prior to Admission medications   Medication Sig Start Date End Date Taking? Authorizing Provider  amiodarone (PACERONE) 200 MG tablet Take 200 mg by mouth daily.    Yes Historical Provider, MD  apixaban (ELIQUIS) 2.5 MG TABS tablet Take 2.5 mg by mouth 2 (two) times  daily.   Yes Historical Provider, MD  budesonide-formoterol (SYMBICORT) 80-4.5 MCG/ACT inhaler Inhale 2 puffs into the lungs 2 (two) times daily.    Yes Historical Provider, MD  bumetanide (BUMEX) 2 MG tablet Take 4 mg by mouth 2 (two) times daily. 08/03/13  Yes Rhetta Mura, MD  cholecalciferol (VITAMIN D) 1000 UNITS tablet Take 1,000 Units by mouth daily.     Yes Historical Provider, MD  colchicine 0.6 MG tablet Take 0.6 mg by mouth daily as needed (for gout).    Yes Historical Provider, MD  doxazosin (CARDURA) 4 MG tablet Take 4 mg by mouth at bedtime.      Yes Historical Provider, MD  ferrous sulfate 325 (65 FE) MG tablet Take 325 mg by mouth at bedtime.    Yes Historical Provider, MD  FLUoxetine (PROZAC) 20 MG capsule Take 40 mg by mouth daily.   Yes Historical Provider, MD  fluticasone (FLONASE) 50 MCG/ACT nasal spray Place 2 sprays into the nose daily.   Yes Historical Provider, MD  hydrALAZINE (APRESOLINE) 25 MG tablet Take 25 mg by mouth 2 (two) times daily. 07/03/13  Yes Historical Provider, MD  ipratropium (ATROVENT) 0.02 % nebulizer solution Take 0.5 mg by nebulization 3 (three) times daily.   Yes Historical Provider, MD  metolazone (ZAROXOLYN) 2.5 MG tablet Take 2.5 mg by mouth daily.   Yes Historical Provider, MD  mometasone-formoterol (DULERA) 100-5 MCG/ACT AERO Inhale 2 puffs into the lungs 2 (two) times daily.   Yes Historical Provider, MD  montelukast (SINGULAIR) 10 MG tablet Take 10 mg by mouth at bedtime. For shortness of breath   Yes Historical Provider, MD  Multiple Vitamins-Minerals (MULTIVITAMINS THER. W/MINERALS) TABS Take 1 tablet by mouth daily.     Yes Historical Provider, MD  omeprazole (PRILOSEC) 20 MG capsule Take 20 mg by mouth daily.     Yes Historical Provider, MD  pravastatin (PRAVACHOL) 20 MG tablet Take 20 mg by mouth every evening.     Yes Historical Provider, MD  propranolol (INDERAL) 20 MG tablet Take 20 mg by mouth 2 (two) times daily.  04/17/13  Yes Historical Provider, MD  sodium chloride (OCEAN) 0.65 % nasal spray Place 1 spray into the nose as needed for congestion.   Yes Historical Provider, MD  traMADol (ULTRAM) 50 MG tablet Take 50 mg by mouth every 8 (eight) hours as needed for moderate pain.    Yes Historical Provider, MD   BP 187/72  Pulse 54  Temp(Src) 95.5 F (35.3 C) (Rectal)  Resp 15  SpO2 97% Physical Exam  Constitutional: She appears well-developed and well-nourished.  HENT:  Head: Normocephalic.  Tenderness to right occipital area with some swelling.  Eyes: Pupils are equal, round, and  reactive to light.  Neck:  No midline tenderness. Cervical collar in place  Cardiovascular: Normal rate and regular rhythm.   Pulmonary/Chest: Effort normal and breath sounds normal.  Abdominal: Soft. Bowel sounds are normal.  Musculoskeletal:  Decreased range of motion left hip.  able to move both hands and feet to command.  Neurological: She is alert.  Some confusion, but appears able follow commands  Skin: Skin is warm.    ED Course  Procedures (including critical care time) Labs Review Labs Reviewed  CBC WITH DIFFERENTIAL - Abnormal; Notable for the following:    RBC 3.18 (*)    Hemoglobin 9.9 (*)    HCT 30.8 (*)    Platelets 107 (*)    Neutrophils Relative % 86 (*)  Lymphocytes Relative 5 (*)    Lymphs Abs 0.4 (*)    All other components within normal limits  I-STAT CHEM 8, ED - Abnormal; Notable for the following:    Sodium 133 (*)    Potassium 2.8 (*)    Chloride 81 (*)    BUN 73 (*)    Creatinine, Ser 2.90 (*)    Glucose, Bld 193 (*)    Calcium, Ion 1.06 (*)    Hemoglobin 11.9 (*)    HCT 35.0 (*)    All other components within normal limits  CBG MONITORING, ED - Abnormal; Notable for the following:    Glucose-Capillary 189 (*)    All other components within normal limits  URINALYSIS, ROUTINE W REFLEX MICROSCOPIC  PROTIME-INR  APTT  HEPARIN LEVEL (UNFRACTIONATED)  HEPARIN LEVEL (UNFRACTIONATED)  APTT    Imaging Review Ct Head Wo Contrast  2014/01/12   CLINICAL DATA:  Fall, unresponsive.  On Coumadin.  EXAM: CT HEAD WITHOUT CONTRAST  CT MAXILLOFACIAL WITHOUT CONTRAST  CT CERVICAL SPINE WITHOUT CONTRAST  TECHNIQUE: Multidetector CT imaging of the head, cervical spine, and maxillofacial structures were performed using the standard protocol without intravenous contrast. Multiplanar CT image reconstructions of the cervical spine and maxillofacial structures were also generated.  COMPARISON:  None.  FINDINGS: CT HEAD FINDINGS  There is a large left subdural  hematoma measuring approximately 15 mm in thickness. This expands over much of the left cerebral hemisphere. Subarachnoid blood noted in the frontal regions bilaterally. Probable bifrontal intraparenchymal contusions also visualized. There is significant left right midline shift of approximately 11 mm. No hydrocephalus. A locule of gas noted overlying the right temporal lobe on image 11.  There is a left occipital skull fracture, nondepressed.  Paranasal sinuses are clear as are the mastoid air cells.  CT MAXILLOFACIAL FINDINGS  No facial fracture or orbital fracture. Paranasal sinuses are clear. Orbital soft tissues unremarkable. Mastoid air cells clear. Zygomatic arches and mandible intact.  CT CERVICAL SPINE FINDINGS  The left occipital fracture is again noted as seen on head CT images. Advanced degenerative disc and facet disease throughout the cervical spine. Fusion across the C4-5 disc space. Prevertebral soft tissues are normal. No epidural or paraspinal hematoma.  IMPRESSION: Large left subdural hematoma covering much of the left cerebral hemisphere, 15 mm in thickness.  Subarachnoid hemorrhage in the frontal regions bilaterally. Bifrontal intraparenchymal contusions.  11 mm left-to-right midline shift.  Left occipital bone fracture, nondepressed.  No acute bony abnormality within the face or cervical spine. Advanced degenerative changes in the cervical spine.  Critical Value/emergent results were called by telephone at the time of interpretation on 01-12-14 at 2:32 PM to Dr. Benjiman Core , who verbally acknowledged these results.   Electronically Signed   By: Charlett Nose M.D.   On: Jan 12, 2014 14:34   Ct Cervical Spine Wo Contrast  January 12, 2014   CLINICAL DATA:  Fall, unresponsive.  On Coumadin.  EXAM: CT HEAD WITHOUT CONTRAST  CT MAXILLOFACIAL WITHOUT CONTRAST  CT CERVICAL SPINE WITHOUT CONTRAST  TECHNIQUE: Multidetector CT imaging of the head, cervical spine, and maxillofacial structures were  performed using the standard protocol without intravenous contrast. Multiplanar CT image reconstructions of the cervical spine and maxillofacial structures were also generated.  COMPARISON:  None.  FINDINGS: CT HEAD FINDINGS  There is a large left subdural hematoma measuring approximately 15 mm in thickness. This expands over much of the left cerebral hemisphere. Subarachnoid blood noted in the frontal regions bilaterally. Probable bifrontal intraparenchymal  contusions also visualized. There is significant left right midline shift of approximately 11 mm. No hydrocephalus. A locule of gas noted overlying the right temporal lobe on image 11.  There is a left occipital skull fracture, nondepressed.  Paranasal sinuses are clear as are the mastoid air cells.  CT MAXILLOFACIAL FINDINGS  No facial fracture or orbital fracture. Paranasal sinuses are clear. Orbital soft tissues unremarkable. Mastoid air cells clear. Zygomatic arches and mandible intact.  CT CERVICAL SPINE FINDINGS  The left occipital fracture is again noted as seen on head CT images. Advanced degenerative disc and facet disease throughout the cervical spine. Fusion across the C4-5 disc space. Prevertebral soft tissues are normal. No epidural or paraspinal hematoma.  IMPRESSION: Large left subdural hematoma covering much of the left cerebral hemisphere, 15 mm in thickness.  Subarachnoid hemorrhage in the frontal regions bilaterally. Bifrontal intraparenchymal contusions.  11 mm left-to-right midline shift.  Left occipital bone fracture, nondepressed.  No acute bony abnormality within the face or cervical spine. Advanced degenerative changes in the cervical spine.  Critical Value/emergent results were called by telephone at the time of interpretation on 01/02/2014 at 2:32 PM to Dr. Benjiman CoreNATHAN Sunshyne Horvath , who verbally acknowledged these results.   Electronically Signed   By: Charlett NoseKevin  Dover M.D.   On: 01/20/2014 14:34   Ct Maxillofacial Wo Cm  01/14/2014   CLINICAL  DATA:  Fall, unresponsive.  On Coumadin.  EXAM: CT HEAD WITHOUT CONTRAST  CT MAXILLOFACIAL WITHOUT CONTRAST  CT CERVICAL SPINE WITHOUT CONTRAST  TECHNIQUE: Multidetector CT imaging of the head, cervical spine, and maxillofacial structures were performed using the standard protocol without intravenous contrast. Multiplanar CT image reconstructions of the cervical spine and maxillofacial structures were also generated.  COMPARISON:  None.  FINDINGS: CT HEAD FINDINGS  There is a large left subdural hematoma measuring approximately 15 mm in thickness. This expands over much of the left cerebral hemisphere. Subarachnoid blood noted in the frontal regions bilaterally. Probable bifrontal intraparenchymal contusions also visualized. There is significant left right midline shift of approximately 11 mm. No hydrocephalus. A locule of gas noted overlying the right temporal lobe on image 11.  There is a left occipital skull fracture, nondepressed.  Paranasal sinuses are clear as are the mastoid air cells.  CT MAXILLOFACIAL FINDINGS  No facial fracture or orbital fracture. Paranasal sinuses are clear. Orbital soft tissues unremarkable. Mastoid air cells clear. Zygomatic arches and mandible intact.  CT CERVICAL SPINE FINDINGS  The left occipital fracture is again noted as seen on head CT images. Advanced degenerative disc and facet disease throughout the cervical spine. Fusion across the C4-5 disc space. Prevertebral soft tissues are normal. No epidural or paraspinal hematoma.  IMPRESSION: Large left subdural hematoma covering much of the left cerebral hemisphere, 15 mm in thickness.  Subarachnoid hemorrhage in the frontal regions bilaterally. Bifrontal intraparenchymal contusions.  11 mm left-to-right midline shift.  Left occipital bone fracture, nondepressed.  No acute bony abnormality within the face or cervical spine. Advanced degenerative changes in the cervical spine.  Critical Value/emergent results were called by telephone  at the time of interpretation on 01/04/2014 at 2:32 PM to Dr. Benjiman CoreNATHAN Schyler Counsell , who verbally acknowledged these results.   Electronically Signed   By: Charlett NoseKevin  Dover M.D.   On: 01/15/2014 14:34     EKG Interpretation None      MDM   Final diagnoses:  Intracranial hemorrhage  Skull fracture    Patient with fall on anticoagulation. Large subdural with also subarachnoid  hemorrhage. Discussed with Dr. Dutch QuintPoole from neurosurgery. This is likely not recoverable. Attempted reversal with Feiba would likely not change course of disease. Discussed with family. Patient is a DO NOT RESUSCITATE and would not want extraordinary measures taken. While in the ER her mental status decreased. She would moan some now and would not follow commands. Will be admitted to internal medicine for palliative care    Juliet Rudeathan R. Rubin PayorPickering, MD 01/19/2014 1544

## 2014-01-05 NOTE — H&P (Addendum)
[facility] History and Physical  PAIDEN CARAVEO UEA:[id number] DOB: [birth year] DOA: 01/10/2014  Referring physician: Dr. Rubin Payor PCP: Cala Bradford, MD   Chief Complaint: fall & confusion  HPI: [name] is a 80 y.o. female  The past medical history of chronic diastolic heart failure, hypertension, prior history of PE, atrial fibrillation on Eluquis chronic kidney disease stage III that comes in for followup. As per family members as the patient is obtunded when they came back at 10 AM, Last time they saw her around 8 AM, she was her normal self, she was found on the floor EMS was called and brought to the ED.  In the ED: CT scan of the head was done that showed a subdural and subarachnoid bleed. Neurosurgery was curbside and they recommended no further treatment or intervention as she was herniating.   Review of Systems:   Past Medical History  Diagnosis Date  . Acute on chronic diastolic congestive heart failure 05/28/2011  . Hypertensive heart disease without CHF 05/28/2011  . History of pulmonary embolus (PE) 05/28/2011    2006 following pelvic fracture  . Chronic venous insufficiency 05/28/2011  . Hyperlipidemia 05/28/2011  . Gout 05/28/2011    "had it in my feet" (07/30/2013)  . Asthma 05/28/2011  . History of GI diverticular bleed 05/28/2011    June 2012  . Atrial fibrillation 05/28/2011  . Osteoporosis   . Pulmonary hypertension 05/28/2011  . GERD (gastroesophageal reflux disease) 05/28/2011  . Mitral regurgitation 05/31/2011  . Secondary hyperparathyroidism (of renal origin)   . Anemia of chronic disease   . Chronic diastolic congestive heart failure   . Hypertension   . COPD (chronic obstructive pulmonary disease)   . Pneumonia ?l;07/30/2013    "now; had it once before" (07/30/2013  . Shortness of breath     "can come on at anytime" (07/30/2013)  . Migraines     "had them when I was young; none in a long time" (07/30/2013)  . CVA (cerebral vascular  accident)     "mini; long time ago"; denies residual on 07/30/2013  . Arthritis     "fingers" (07/30/2013)  . Chronic kidney disease stage 3 05/28/2011   Past Surgical History  Procedure Laterality Date  . Partial colectomy      1999 following colonoscopy complication  . Total hip arthroplasty Left 2004    complicated with dislocation ("put it in place without surgery")  . Total hip arthroplasty Right 2005  . Cataract extraction w/ intraocular lens  implant, bilateral Bilateral   . Colon resection  1999    secondary to puncture of colon during colonoscopy/notes 10/10/2002 (07/30/2013)  . Inguinal hernia repair Right 1960's?   Social History:  reports that she has never smoked. She has never used smokeless tobacco. She reports that she does not drink alcohol or use illicit drugs.  Allergies  Allergen Reactions  . Lisinopril     REACTION: angioedema  . Primidone Other (See Comments)    No reaction noted  . Warfarin And Related     "GI BLEED"  . Clonidine Derivatives Rash    History reviewed. No pertinent family history.   Prior to Admission medications   Medication Sig Start Date End Date Taking? Authorizing Provider  amiodarone (PACERONE) 200 MG tablet Take 200 mg by mouth daily.    Yes Historical Provider, MD  apixaban (ELIQUIS) 2.5 MG TABS tablet Take 2.5 mg by mouth 2 (two) times daily.   Yes Historical Provider, MD  budesonide-formoterol (  SYMBICORT) 80-4.5 MCG/ACT inhaler Inhale 2 puffs into the lungs 2 (two) times daily.    Yes Historical Provider, MD  bumetanide (BUMEX) 2 MG tablet Take 4 mg by mouth 2 (two) times daily. 08/03/13  Yes Rhetta Mura, MD  cholecalciferol (VITAMIN D) 1000 UNITS tablet Take 1,000 Units by mouth daily.     Yes Historical Provider, MD  colchicine 0.6 MG tablet Take 0.6 mg by mouth daily as needed (for gout).    Yes Historical Provider, MD  doxazosin (CARDURA) 4 MG tablet Take 4 mg by mouth at bedtime.     Yes Historical Provider, MD  ferrous  sulfate 325 (65 FE) MG tablet Take 325 mg by mouth at bedtime.    Yes Historical Provider, MD  FLUoxetine (PROZAC) 20 MG capsule Take 40 mg by mouth daily.   Yes Historical Provider, MD  fluticasone (FLONASE) 50 MCG/ACT nasal spray Place 2 sprays into the nose daily.   Yes Historical Provider, MD  hydrALAZINE (APRESOLINE) 25 MG tablet Take 25 mg by mouth 2 (two) times daily. 07/03/13  Yes Historical Provider, MD  ipratropium (ATROVENT) 0.02 % nebulizer solution Take 0.5 mg by nebulization 3 (three) times daily.   Yes Historical Provider, MD  metolazone (ZAROXOLYN) 2.5 MG tablet Take 2.5 mg by mouth daily.   Yes Historical Provider, MD  mometasone-formoterol (DULERA) 100-5 MCG/ACT AERO Inhale 2 puffs into the lungs 2 (two) times daily.   Yes Historical Provider, MD  montelukast (SINGULAIR) 10 MG tablet Take 10 mg by mouth at bedtime. For shortness of breath   Yes Historical Provider, MD  Multiple Vitamins-Minerals (MULTIVITAMINS THER. W/MINERALS) TABS Take 1 tablet by mouth daily.     Yes Historical Provider, MD  omeprazole (PRILOSEC) 20 MG capsule Take 20 mg by mouth daily.     Yes Historical Provider, MD  pravastatin (PRAVACHOL) 20 MG tablet Take 20 mg by mouth every evening.     Yes Historical Provider, MD  propranolol (INDERAL) 20 MG tablet Take 20 mg by mouth 2 (two) times daily.  04/17/13  Yes Historical Provider, MD  sodium chloride (OCEAN) 0.65 % nasal spray Place 1 spray into the nose as needed for congestion.   Yes Historical Provider, MD  traMADol (ULTRAM) 50 MG tablet Take 50 mg by mouth every 8 (eight) hours as needed for moderate pain.    Yes Historical Provider, MD   Physical Exam: Filed Vitals:   01/04/2014 1515  BP: 187/72  Pulse: 54  Temp:   Resp: 15    BP 187/72  Pulse 54  Temp(Src) 95.5 F (35.3 C) (Rectal)  Resp 15  SpO2 97%  General:  Appears calm and comfortable Eyes: Dolll's eyes ENT: Mouth is full of dry blood Neck: no LAD, masses or thyromegaly Cardiovascular:  RRR, no m/r/g. No LE edema. Telemetry: SR, no arrhythmias , bradycardic Respiratory: CTA bilaterally, no w/r/r. Normal respiratory effort. Abdomen: soft, ntnd Musculoskeletal: grossly normal tone BUE/BLE Psychiatric: grossly normal mood and affect, speech fluent and appropriate           Labs on Admission:  Basic Metabolic Panel:  Recent Labs Lab 01/22/2014 1439  NA 133*  K 2.8*  CL 81*  GLUCOSE 193*  BUN 73*  CREATININE 2.90*   Liver Function Tests: No results found for this basename: AST, ALT, ALKPHOS, BILITOT, PROT, ALBUMIN,  in the last 168 hours No results found for this basename: LIPASE, AMYLASE,  in the last 168 hours No results found for this basename: AMMONIA,  in the last 168 hours CBC:  Recent Labs Lab 01/04/14 1255 Jul 24, 2014 1439 Jul 24, 2014 1440  WBC  --   --  8.9  NEUTROABS  --   --  7.6  HGB 10.2* 11.9* 9.9*  HCT  --  35.0* 30.8*  MCV  --   --  96.9  PLT  --   --  107*   Cardiac Enzymes: No results found for this basename: CKTOTAL, CKMB, CKMBINDEX, TROPONINI,  in the last 168 hours  BNP (last 3 results)  Recent Labs  04/23/13 2245 04/27/13 1140 07/30/13 1500  PROBNP 6054.0* 3072.0* 11846.0*   CBG:  Recent Labs Lab Jul 24, 2014 1348  GLUCAP 189*    Radiological Exams on Admission: Ct Head Wo Contrast  01/08/14   CLINICAL DATA:  Fall, unresponsive.  On Coumadin.  EXAM: CT HEAD WITHOUT CONTRAST  CT MAXILLOFACIAL WITHOUT CONTRAST  CT CERVICAL SPINE WITHOUT CONTRAST  TECHNIQUE: Multidetector CT imaging of the head, cervical spine, and maxillofacial structures were performed using the standard protocol without intravenous contrast. Multiplanar CT image reconstructions of the cervical spine and maxillofacial structures were also generated.  COMPARISON:  None.  FINDINGS: CT HEAD FINDINGS  There is a large left subdural hematoma measuring approximately 15 mm in thickness. This expands over much of the left cerebral hemisphere. Subarachnoid blood noted in  the frontal regions bilaterally. Probable bifrontal intraparenchymal contusions also visualized. There is significant left right midline shift of approximately 11 mm. No hydrocephalus. A locule of gas noted overlying the right temporal lobe on image 11.  There is a left occipital skull fracture, nondepressed.  Paranasal sinuses are clear as are the mastoid air cells.  CT MAXILLOFACIAL FINDINGS  No facial fracture or orbital fracture. Paranasal sinuses are clear. Orbital soft tissues unremarkable. Mastoid air cells clear. Zygomatic arches and mandible intact.  CT CERVICAL SPINE FINDINGS  The left occipital fracture is again noted as seen on head CT images. Advanced degenerative disc and facet disease throughout the cervical spine. Fusion across the C4-5 disc space. Prevertebral soft tissues are normal. No epidural or paraspinal hematoma.  IMPRESSION: Large left subdural hematoma covering much of the left cerebral hemisphere, 15 mm in thickness.  Subarachnoid hemorrhage in the frontal regions bilaterally. Bifrontal intraparenchymal contusions.  11 mm left-to-right midline shift.  Left occipital bone fracture, nondepressed.  No acute bony abnormality within the face or cervical spine. Advanced degenerative changes in the cervical spine.  Critical Value/emergent results were called by telephone at the time of interpretation on 01/08/14 at 2:32 PM to Dr. Benjiman CoreNATHAN PICKERING , who verbally acknowledged these results.   Electronically Signed   By: Charlett NoseKevin  Dover M.D.   On: 006/16/15 14:34   Ct Cervical Spine Wo Contrast  01/08/14   CLINICAL DATA:  Fall, unresponsive.  On Coumadin.  EXAM: CT HEAD WITHOUT CONTRAST  CT MAXILLOFACIAL WITHOUT CONTRAST  CT CERVICAL SPINE WITHOUT CONTRAST  TECHNIQUE: Multidetector CT imaging of the head, cervical spine, and maxillofacial structures were performed using the standard protocol without intravenous contrast. Multiplanar CT image reconstructions of the cervical spine and  maxillofacial structures were also generated.  COMPARISON:  None.  FINDINGS: CT HEAD FINDINGS  There is a large left subdural hematoma measuring approximately 15 mm in thickness. This expands over much of the left cerebral hemisphere. Subarachnoid blood noted in the frontal regions bilaterally. Probable bifrontal intraparenchymal contusions also visualized. There is significant left right midline shift of approximately 11 mm. No hydrocephalus. A locule of gas noted overlying the right  temporal lobe on image 11.  There is a left occipital skull fracture, nondepressed.  Paranasal sinuses are clear as are the mastoid air cells.  CT MAXILLOFACIAL FINDINGS  No facial fracture or orbital fracture. Paranasal sinuses are clear. Orbital soft tissues unremarkable. Mastoid air cells clear. Zygomatic arches and mandible intact.  CT CERVICAL SPINE FINDINGS  The left occipital fracture is again noted as seen on head CT images. Advanced degenerative disc and facet disease throughout the cervical spine. Fusion across the C4-5 disc space. Prevertebral soft tissues are normal. No epidural or paraspinal hematoma.  IMPRESSION: Large left subdural hematoma covering much of the left cerebral hemisphere, 15 mm in thickness.  Subarachnoid hemorrhage in the frontal regions bilaterally. Bifrontal intraparenchymal contusions.  11 mm left-to-right midline shift.  Left occipital bone fracture, nondepressed.  No acute bony abnormality within the face or cervical spine. Advanced degenerative changes in the cervical spine.  Critical Value/emergent results were called by telephone at the time of interpretation on 12/27/2013 at 2:32 PM to Dr. Benjiman CoreNATHAN PICKERING , who verbally acknowledged these results.   Electronically Signed   By: Charlett NoseKevin  Dover M.D.   On: 01/08/2014 14:34   Ct Maxillofacial Wo Cm  01/04/2014   CLINICAL DATA:  Fall, unresponsive.  On Coumadin.  EXAM: CT HEAD WITHOUT CONTRAST  CT MAXILLOFACIAL WITHOUT CONTRAST  CT CERVICAL SPINE  WITHOUT CONTRAST  TECHNIQUE: Multidetector CT imaging of the head, cervical spine, and maxillofacial structures were performed using the standard protocol without intravenous contrast. Multiplanar CT image reconstructions of the cervical spine and maxillofacial structures were also generated.  COMPARISON:  None.  FINDINGS: CT HEAD FINDINGS  There is a large left subdural hematoma measuring approximately 15 mm in thickness. This expands over much of the left cerebral hemisphere. Subarachnoid blood noted in the frontal regions bilaterally. Probable bifrontal intraparenchymal contusions also visualized. There is significant left right midline shift of approximately 11 mm. No hydrocephalus. A locule of gas noted overlying the right temporal lobe on image 11.  There is a left occipital skull fracture, nondepressed.  Paranasal sinuses are clear as are the mastoid air cells.  CT MAXILLOFACIAL FINDINGS  No facial fracture or orbital fracture. Paranasal sinuses are clear. Orbital soft tissues unremarkable. Mastoid air cells clear. Zygomatic arches and mandible intact.  CT CERVICAL SPINE FINDINGS  The left occipital fracture is again noted as seen on head CT images. Advanced degenerative disc and facet disease throughout the cervical spine. Fusion across the C4-5 disc space. Prevertebral soft tissues are normal. No epidural or paraspinal hematoma.  IMPRESSION: Large left subdural hematoma covering much of the left cerebral hemisphere, 15 mm in thickness.  Subarachnoid hemorrhage in the frontal regions bilaterally. Bifrontal intraparenchymal contusions.  11 mm left-to-right midline shift.  Left occipital bone fracture, nondepressed.  No acute bony abnormality within the face or cervical spine. Advanced degenerative changes in the cervical spine.  Critical Value/emergent results were called by telephone at the time of interpretation on 01/04/2014 at 2:32 PM to Dr. Benjiman CoreNATHAN PICKERING , who verbally acknowledged these results.    Electronically Signed   By: Charlett NoseKevin  Dover M.D.   On: 01/18/2014 14:34    EKG: Independently reviewed. none  Assessment/Plan Principal Problem:   Subdural hematoma Active Problems:   Atrial fibrillation   Chronic kidney disease stage 3   Subarachnoid hemorrhage  - Neurosurgery was curb sided by the emergency room physician recommended no further intervention or treatment as he would be futile, she has a large left subdural  hematoma measuring about 15 mm in thickness with subarachnoid hemorrhage and 11 mm left-to-right shift. - I  Have explained to the family the poor prognoses of her injuries and that this will lead to her demise in a few hours to days. The patient is currently a DO NOT RESUSCITATE DO NOT INTUBATE. He agreed with starting morphine as needed. And move towards comfort care. please document name and whether formally or informally consulted  Code Status: DNR Family Communication: daughter and son in law Disposition Plan: inpatient  Time spent: 18   FELIZ Rosine Beat [facility] Pager [phone]  **Disclaimer: This note may have been dictated with voice recognition software. Similar sounding words can inadvertently be transcribed and this note may contain transcription errors which may not have been corrected upon publication of note.**

## 2014-01-05 NOTE — Progress Notes (Addendum)
Report taken by charge RN Tami; pt arrived on the unit at 1630. Pt incontinent of urine upon arrival to the floor; pt cleaned, transferred from stretcher to bed; pt placed on 4L oxygen; suctioning set up; call light within reach; pt lethargic and not responding upon arrival to the floor but breathing. Vitals taken; pt family and love ones at bedside. Family oriented to the floor and call light; comfort snack cart ordered and placed in pt room for family. Will continue to monitor pt quietly. IV remains intact saline locked. [name].

## 2014-01-06 DIAGNOSIS — N183 Chronic kidney disease, stage 3 unspecified: Secondary | ICD-10-CM

## 2014-01-06 DIAGNOSIS — I5032 Chronic diastolic (congestive) heart failure: Secondary | ICD-10-CM

## 2014-01-11 ENCOUNTER — Encounter (HOSPITAL_COMMUNITY): Payer: Medicare Other

## 2014-01-16 DIAGNOSIS — J9600 Acute respiratory failure, unspecified whether with hypoxia or hypercapnia: Secondary | ICD-10-CM | POA: Diagnosis present

## 2014-01-16 DIAGNOSIS — R4020 Unspecified coma: Secondary | ICD-10-CM | POA: Diagnosis present

## 2014-01-23 NOTE — Discharge Summary (Addendum)
Death Summary  [name]:[id number] DOB: March 28, 1922 DOA: 2013/09/02  PCP: Cala BradfordWHITE,CYNTHIA S, MD PCP/Office notified: no  Admit date: 2013/09/02 Date of Death: 01/16/2014  Final Diagnoses:  Principal Problem:   Subdural hematoma Active Problems:   Acute encephalopathy   Atrial fibrillation   Chronic kidney disease stage 3   Subarachnoid hemorrhage   Subdural hemorrhage   Coma   Acute respiratory failure    History of present illness:  80 y.o. female  The past medical history of chronic diastolic heart failure, hypertension, prior history of PE, atrial fibrillation on Eluquis chronic kidney disease stage III that comes in for followup. As per family members as the patient is obtunded they saw her on the morning of admission around 8 AM he went out and when they came back at 10 AM she was found on the floor EMS was called and brought to the ED.   Hospital Course:  I spoke to the family about the poor prognoses of her injuries (subdural hematoma and subarachnoid bleed) and that this will lead to her demise in a few hours. The patient is currently a DO NOT RESUSCITATE DO NOT INTUBATE. They agreed with starting morphine as needed. The patient passed away on 6.14.2015 6 am.   Time: 10  Signed:  Marinda ElkFELIZ ORTIZ, Kenzli Barritt  [facility] 01/16/2014, 1:56 PM

## 2014-01-23 NOTE — Progress Notes (Signed)
[facility] was called and so was the medical examiner. Will pass report off to next shift.

## 2014-01-23 NOTE — Progress Notes (Signed)
Patient is unresponsive BP and O2 sats still low. Dr. Jeanene Erballed and no new orders given, will continue to monitor.

## 2014-01-23 NOTE — Progress Notes (Signed)
Utilization Review Completed.Tanyon Alipio T6/14/2015  

## 2014-01-23 NOTE — Progress Notes (Signed)
Patient was pronounce at 06:05 am

## 2014-01-23 DEATH — deceased
